# Patient Record
Sex: Female | Born: 1960
Health system: Southern US, Community
[De-identification: ages and names within clinical notes are randomized; demographics above are authoritative.]

## PROBLEM LIST (undated history)

## (undated) DIAGNOSIS — R51 Headache: Secondary | ICD-10-CM

## (undated) DIAGNOSIS — R0683 Snoring: Secondary | ICD-10-CM

## (undated) DIAGNOSIS — T4145XA Adverse effect of unspecified anesthetic, initial encounter: Secondary | ICD-10-CM

## (undated) DIAGNOSIS — T8859XA Other complications of anesthesia, initial encounter: Secondary | ICD-10-CM

## (undated) DIAGNOSIS — F988 Other specified behavioral and emotional disorders with onset usually occurring in childhood and adolescence: Secondary | ICD-10-CM

## (undated) DIAGNOSIS — M1712 Unilateral primary osteoarthritis, left knee: Secondary | ICD-10-CM

## (undated) DIAGNOSIS — M1990 Unspecified osteoarthritis, unspecified site: Secondary | ICD-10-CM

## (undated) DIAGNOSIS — E079 Disorder of thyroid, unspecified: Secondary | ICD-10-CM

## (undated) DIAGNOSIS — E039 Hypothyroidism, unspecified: Secondary | ICD-10-CM

## (undated) DIAGNOSIS — R5383 Other fatigue: Secondary | ICD-10-CM

## (undated) DIAGNOSIS — G500 Trigeminal neuralgia: Secondary | ICD-10-CM

## (undated) DIAGNOSIS — Z780 Asymptomatic menopausal state: Secondary | ICD-10-CM

## (undated) DIAGNOSIS — R319 Hematuria, unspecified: Principal | ICD-10-CM

## (undated) DIAGNOSIS — G479 Sleep disorder, unspecified: Secondary | ICD-10-CM

## (undated) HISTORY — DX: Disorder of thyroid, unspecified: E07.9

## (undated) HISTORY — PX: NASAL SEPTUM SURGERY: SHX37

## (undated) HISTORY — DX: Other fatigue: R53.83

## (undated) HISTORY — DX: Unspecified osteoarthritis, unspecified site: M19.90

## (undated) HISTORY — DX: Hematuria, unspecified: R31.9

## (undated) HISTORY — PX: OTHER SURGICAL HISTORY: SHX169

## (undated) HISTORY — DX: Snoring: R06.83

## (undated) HISTORY — PX: ABDOMINAL HYSTERECTOMY: SHX81

## (undated) HISTORY — DX: Other specified behavioral and emotional disorders with onset usually occurring in childhood and adolescence: F98.8

## (undated) HISTORY — DX: Sleep disorder, unspecified: G47.9

## (undated) HISTORY — PX: LASIK: SHX215

## (undated) HISTORY — PX: KNEE ARTHROSCOPY W/ DEBRIDEMENT: SHX1867

## (undated) HISTORY — DX: Asymptomatic menopausal state: Z78.0

## (undated) HISTORY — PX: VEIN SURGERY: SHX48

## (undated) HISTORY — PX: TOTAL ABDOMINAL HYSTERECTOMY: SHX209

---

## 2001-02-14 ENCOUNTER — Other Ambulatory Visit: Admission: RE | Admit: 2001-02-14 | Discharge: 2001-02-14 | Payer: Self-pay | Admitting: *Deleted

## 2001-10-17 ENCOUNTER — Ambulatory Visit (HOSPITAL_COMMUNITY): Admission: RE | Admit: 2001-10-17 | Discharge: 2001-10-17 | Payer: Self-pay | Admitting: Family Medicine

## 2001-10-17 ENCOUNTER — Encounter: Payer: Self-pay | Admitting: Family Medicine

## 2001-10-20 ENCOUNTER — Ambulatory Visit (HOSPITAL_COMMUNITY): Admission: RE | Admit: 2001-10-20 | Discharge: 2001-10-20 | Payer: Self-pay | Admitting: Family Medicine

## 2001-10-20 ENCOUNTER — Encounter: Payer: Self-pay | Admitting: Family Medicine

## 2002-04-13 ENCOUNTER — Encounter: Payer: Self-pay | Admitting: Family Medicine

## 2002-04-13 ENCOUNTER — Ambulatory Visit (HOSPITAL_COMMUNITY): Admission: RE | Admit: 2002-04-13 | Discharge: 2002-04-13 | Payer: Self-pay | Admitting: Family Medicine

## 2003-12-26 ENCOUNTER — Ambulatory Visit (HOSPITAL_COMMUNITY): Admission: RE | Admit: 2003-12-26 | Discharge: 2003-12-26 | Payer: Self-pay | Admitting: Internal Medicine

## 2004-03-21 ENCOUNTER — Inpatient Hospital Stay (HOSPITAL_COMMUNITY): Admission: AD | Admit: 2004-03-21 | Discharge: 2004-03-24 | Payer: Self-pay | Admitting: Internal Medicine

## 2004-05-15 ENCOUNTER — Encounter (HOSPITAL_COMMUNITY): Admission: RE | Admit: 2004-05-15 | Discharge: 2004-05-15 | Payer: Self-pay | Admitting: Endocrinology

## 2004-10-19 ENCOUNTER — Ambulatory Visit (HOSPITAL_COMMUNITY): Admission: RE | Admit: 2004-10-19 | Discharge: 2004-10-19 | Payer: Self-pay | Admitting: Internal Medicine

## 2006-06-24 ENCOUNTER — Encounter: Admission: RE | Admit: 2006-06-24 | Discharge: 2006-06-24 | Payer: Self-pay | Admitting: Obstetrics and Gynecology

## 2007-07-21 ENCOUNTER — Encounter: Admission: RE | Admit: 2007-07-21 | Discharge: 2007-07-21 | Payer: Self-pay | Admitting: Obstetrics and Gynecology

## 2007-07-27 ENCOUNTER — Encounter: Admission: RE | Admit: 2007-07-27 | Discharge: 2007-07-27 | Payer: Self-pay | Admitting: Obstetrics and Gynecology

## 2007-08-25 ENCOUNTER — Encounter: Admission: RE | Admit: 2007-08-25 | Discharge: 2007-08-25 | Payer: Self-pay | Admitting: Obstetrics and Gynecology

## 2007-12-01 ENCOUNTER — Ambulatory Visit (HOSPITAL_COMMUNITY): Admission: RE | Admit: 2007-12-01 | Discharge: 2007-12-01 | Payer: Self-pay | Admitting: Family Medicine

## 2007-12-27 ENCOUNTER — Ambulatory Visit (HOSPITAL_COMMUNITY): Admission: RE | Admit: 2007-12-27 | Discharge: 2007-12-27 | Payer: Self-pay | Admitting: Family Medicine

## 2008-07-26 ENCOUNTER — Encounter: Admission: RE | Admit: 2008-07-26 | Discharge: 2008-07-26 | Payer: Self-pay | Admitting: Obstetrics and Gynecology

## 2009-08-04 ENCOUNTER — Encounter: Admission: RE | Admit: 2009-08-04 | Discharge: 2009-08-04 | Payer: Self-pay | Admitting: Obstetrics and Gynecology

## 2009-10-10 ENCOUNTER — Encounter: Admission: RE | Admit: 2009-10-10 | Discharge: 2009-10-10 | Payer: Self-pay | Admitting: Otolaryngology

## 2010-08-05 ENCOUNTER — Encounter
Admission: RE | Admit: 2010-08-05 | Discharge: 2010-08-05 | Payer: Self-pay | Source: Home / Self Care | Attending: Obstetrics and Gynecology | Admitting: Obstetrics and Gynecology

## 2010-10-28 ENCOUNTER — Encounter (HOSPITAL_COMMUNITY)
Admission: RE | Admit: 2010-10-28 | Discharge: 2010-10-28 | Disposition: A | Discharge status: Home / Self Care | Payer: BC Managed Care – PPO | Source: Ambulatory Visit | Attending: Obstetrics and Gynecology | Admitting: Obstetrics and Gynecology

## 2010-10-28 LAB — CBC
HCT: 43.5 % (ref 36.0–46.0)
MCH: 30.5 pg (ref 26.0–34.0)
MCV: 96.2 fL (ref 78.0–100.0)
Platelets: 201 10*3/uL (ref 150–400)
RBC: 4.52 MIL/uL (ref 3.87–5.11)
RDW: 12.9 % (ref 11.5–15.5)
WBC: 4.5 10*3/uL (ref 4.0–10.5)

## 2010-10-30 ENCOUNTER — Other Ambulatory Visit: Payer: Self-pay | Admitting: Obstetrics and Gynecology

## 2010-10-30 ENCOUNTER — Ambulatory Visit (HOSPITAL_COMMUNITY)
Admission: RE | Admit: 2010-10-30 | Discharge: 2010-10-30 | Disposition: A | Discharge status: Home / Self Care | Payer: BC Managed Care – PPO | Source: Ambulatory Visit | Attending: Obstetrics and Gynecology | Admitting: Obstetrics and Gynecology

## 2010-10-30 DIAGNOSIS — Z01812 Encounter for preprocedural laboratory examination: Secondary | ICD-10-CM | POA: Insufficient documentation

## 2010-10-30 DIAGNOSIS — N949 Unspecified condition associated with female genital organs and menstrual cycle: Secondary | ICD-10-CM | POA: Insufficient documentation

## 2010-10-30 DIAGNOSIS — N938 Other specified abnormal uterine and vaginal bleeding: Secondary | ICD-10-CM | POA: Insufficient documentation

## 2010-10-30 DIAGNOSIS — N840 Polyp of corpus uteri: Secondary | ICD-10-CM | POA: Insufficient documentation

## 2010-10-30 DIAGNOSIS — Z01818 Encounter for other preprocedural examination: Secondary | ICD-10-CM | POA: Insufficient documentation

## 2010-11-11 NOTE — H&P (Signed)
  NAMEHONESTEE, Yvette Joyce                ACCOUNT NO.:  [id number]  MEDICAL RECORD NO.:  [id number]         PATIENT TYPE:  WAMB  LOCATION:                                [facility]:  [facility]  PHYSICIAN:  Sherron Monday, MD        DATE OF BIRTH:  [birth year]  DATE OF ADMISSION:  10/30/2010 DATE OF DISCHARGE:                             HISTORY & PHYSICAL   ADMISSION DIAGNOSES:  Irregular vaginal bleeding with likely endometrial mass i.e. polyp.  PROCEDURE PLANNED:  Hysteroscopy, dilatation and curettage, and polypectomy.  HISTORY OF PRESENT ILLNESS:  A 50 year old G0 with irregular vaginal bleeding who presents for evaluation.  After a ultrasound, it was noted that she had a normal-sized uterus with a complex endometrial mass, but poorly outlined with a calcified area with vascular flow, the mass was approximately 5 x 2 cm, otherwise evaluate the cavity.  The patient also discussed the results of the ultrasound and it was decided to proceed with a hysteroscopy, D and C, and polypectomy.  PAST MEDICAL HISTORY:  Significant for thyroid disease.  PAST SURGICAL HISTORY:  Significant for foot surgery and sinus surgery.  PAST OB/GYN HISTORY:  She is a G0.  No history of any abnormal Pap smears with an HPV negative pap in January 2012.  No history of any sexually transmitted diseases.  She is not sexually active with occasional spotting.  MEDICATIONS:  Levothyroxine 50 mg daily.  ALLERGIES:  No known drug allergies.  SOCIAL HISTORY:  Denies alcohol or drug use.  States she occasionally has alcohol.  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  FAMILY HISTORY:  Significant for breast cancer in a cousin, hypertension in mother and father, colon cancer in uncle, heart disease in the father, stroke in mother, and osteoporosis on her mother's side.  PHYSICAL EXAMINATION:  VITAL SIGNS:  She is 5 feet, 6 inches tall, weight is 128 pounds. GENERAL:  No apparent distress. CARDIOVASCULAR:   Regular rate and rhythm. LUNGS:  Clear to auscultation bilaterally. ABDOMEN:  Soft, nontender, nondistended.  Good bowel sounds. EXTREMITIES:  Symmetric and nontender. NECK:  No lymphadenopathy.  Thyroid within normal limits. HEENT:  Mucous membranes are moist. BREASTS:  B+.  No masses, nontender with no distortion. BACK:  Without costovertebral angle tenderness. PELVIC:  Normal external female genitalia.  Normal Bartholin's, urethral, and Skene's glands.  Cervix and vagina without lesions. Cervical motion tenderness.  Normal-sized uterus, normal adnexa.  An ultrasound revealed a normal-sized uterus with  mass in the uterus.  ASSESSMENT/PLAN:  A 50 year old G0 with likely endometrial polyp for hysteroscopy, dilatation and curettage, and polypectomy for removal of this polyp was sent to pathology for further evaluation.     Sherron Monday, MD     JB/MEDQ  D:  10/29/2010  T:  10/29/2010  Job:  [id number]  Electronically Signed by Sherron Monday MD on 11/11/2010 04:25:26 PM

## 2010-11-11 NOTE — Op Note (Signed)
  NAMEYARESLY, MENZEL                ACCOUNT NO.:  [id number]  MEDICAL RECORD NO.:  [id number]           PATIENT TYPE:  O  LOCATION:  [facility]                          [facility]  PHYSICIAN:  Sherron Monday, MD        DATE OF BIRTH:  06-Jan-1961  DATE OF PROCEDURE:  10/30/2010 DATE OF DISCHARGE:                              OPERATIVE REPORT   PREOPERATIVE DIAGNOSIS:  Irregular vaginal bleeding likely polyp by ultrasound.  POSTOPERATIVE DIAGNOSES:  Irregular vaginal bleeding likely polyp by ultrasound, multiple polyps.  PROCEDURES:  Operative hysteroscopy, dilation and curettage and polypectomy.  SURGEON:  Sherron Monday, MD  ASSISTANT:  None.  ANESTHESIA:  General by LMA with 15 mL of 2% lidocaine for a paracervical block.  FINDINGS:  Small anteverted uterus with multiple polyps.  SPECIMENS:  Polyps and endometrial curettings to Pathology.  ESTIMATED BLOOD LOSS:  Minimal.  IV FLUIDS:  700 mL.  URINE OUTPUT:  I and O cath at the end of the procedure.  COMPLICATIONS:  None.  DISPOSITION:  Stable to PACU following the procedure.  PROCEDURE:  After informed consent was reviewed with the patient, she was transferred to the OR where she was placed on the table.  General anesthesia was induced and found to be adequate.  She was then placed in the Y[place]in stirrups, prepped and draped in the normal sterile fashion. Her bladder was sterilely emptied.  A exam was performed revealing an anteverted uterus and open-sided speculum was placed in her vagina.  A paracervical block was obtained.  A single tooth tenaculum was applied to the anterior lip of the cervix.  Cervix was dilated to accommodate the hysteroscope to 23-French.  A brief pelvic survey was performed revealed multiple endometrial polyps using a combination of D and C and the Randall stone forceps.  Two small polyps were removed intermittently checking to make sure that they had been removed.  Both ostia were also  identified.  The instruments were removed from the patient's vagina.  Hemostasis was assured.  The patient was awakened in stable condition and transferred to the PACU following the procedure.     Sherron Monday, MD     JB/MEDQ  D:  10/30/2010  T:  10/31/2010  Job:  [id number]  Electronically Signed by Sherron Monday MD on 11/11/2010 04:25:39 PM

## 2011-01-01 NOTE — H&P (Signed)
NAME:  Yvette Joyce, Yvette Joyce                          ACCOUNT NO.:  [id number]   MEDICAL RECORD NO.:  [id number]                   PATIENT TYPE:  INP   LOCATION:  A203                                 FACILITY:  [facility]   PHYSICIAN:  Madelin Rear. Sherwood Gambler, M.D.             DATE OF BIRTH:  03/30/1961   DATE OF ADMISSION:  03/21/2004  DATE OF DISCHARGE:                                HISTORY & PHYSICAL   CHIEF COMPLAINT:  Chest pain.   HISTORY OF PRESENT ILLNESS:  The patient presents with approximately 12  hours of nocturnally awakening chest pain in the left anterior chest wall  area.  She had no associated symptoms, just persistence of a waxing and  waning dull ache.  There was no diaphoretic spell, shortness of breath,  palpitations, syncope, nausea, vomiting.  No shortness of breath, cough or  sputum.  She does admit to a little bit of hoarseness associated with this  and possibly a little bit of a scratchy throat.  However, denies any fever,  rigors, chills or skin rash.  There is no tick bite exposure known.  She  admitted to mild posterior myalgias of the neck and trapezius area.   PAST MEDICAL HISTORY:  1. Hypothyroidism, documented as euthyroid on March 16, 2004 recent blood     work.  2. She has also had some hypercholesterolemia.  However, last set of lipids     was drawn when she was hypothyroid.   MEDICATIONS:  She is maintained on chronic medication with birth control  pills as well as Synthroid.   She has had previous recurrent back pain.   Cardiac risk factors include an extremely strong family history with  multiple first degree relatives with coronary artery disease.  She does  personally continue good exercise regimen and has never had any exertional  chest pain.  In the remote past as a child, she experienced palpitations,  was evaluated by cardiology and felt to be with no acute illness and  required no therapy for that.   FAMILY HISTORY:  As outlined under HPI, past  medical history and cardiac  risk factors.   SOCIAL HISTORY:  She does not smoke, drink or use any illicit drugs.   REVIEW OF SYSTEMS:  Under HPI.  Otherwise negative.   PHYSICAL EXAMINATION:  SKIN:  Unremarkable.  HEAD/NECK:  No JVD or adenopathy.  NECK:  Supple  CHEST:  Clear.  CARDIAC:  Regular rate and rhythm without murmurs, gallops or rubs.  Specifically there is no chest wall tenderness.  ABDOMEN:  Benign.  NEUROLOGIC:  Normal.  EXTREMITIES:  Without clubbing, cyanosis or edema.   EKG performed in the office reveals normal sinus rhythm and is within normal  limits.  She is having chest pain at the time of the EKG.   IMPRESSION:  Chest pain, unexplained at this point.  Likely noncardiac.  However, with the notorious atypical presentation of  middle aged females  with cardiac disease, she will be admitted for a rule out protocol.  Cardiology consultation is anticipated.  Thyroid status is euthyroid.  We  will check her lipid profile now that she is euthyroid.  She will, of  course, require a stress Cardiolite at some point.  We will leave this for  cardiology to perform, probably as an outpatient.     ___________________________________________                                         Madelin Rear. Sherwood Gambler, M.D.   LJF/MEDQ  D:  03/21/2004  T:  03/21/2004  Job:  [id number]

## 2011-01-01 NOTE — Procedures (Signed)
NAMEMAZEL, VILLELA                          ACCOUNT NO.:  [id number]   MEDICAL RECORD NO.:  [id number]                   PATIENT TYPE:  INP   LOCATION:  A203                                 FACILITY:  [facility]   PHYSICIAN:  Dani Gobble, MD                    DATE OF BIRTH:  11/06/1960   DATE OF PROCEDURE:  03/23/2004  DATE OF DISCHARGE:                                  ECHOCARDIOGRAM   INDICATIONS FOR PROCEDURE:  Ms. Jeanella Anton is a 50 year old female with a past  medical history of hypothyroidism, who was admitted with chest pain.   DESCRIPTION OF PROCEDURE:  The technical quality of the study is adequate.   The aorta is within normal limits at 2.2 cm.   The left atrium is also within normal limits at 3.3 cm.  No obvious clots or  masses were appreciated and the patient appeared to be in sinus rhythm  during this procedure.   The intraventricular septal and posterior wall are within normal limits in  thickness at 1.0 cm and 0.9 cm, respectively.   The aortic valve is thin, pliable, and trileaflet with normal leaflet  excursion.  No aortic insufficiency is noted.  Doppler interrogation of the  aortic valve is within normal limits.   The mitral valve is also grossly structurally normal.  No mitral valve  prolapse is noted, although there is flat coaptation of the mitral valve  leaflets.  Trivial to mild mitral valve regurgitation is noted.  Doppler  interrogation of the mitral valve is within normal limits.   The pulmonic valve was incompletely visualized but appeared to be grossly  structurally normal.  Trivial pulmonic insufficiency is noted.   The tricuspid valve also appears structurally normal with trivial tricuspid  regurgitation noted.   The left ventricle is normal in size with the LVIDD measuring at 4.2 cm and  the LVID measuring at 3.1 cm.  Overall left ventricular systolic function is  normal and no regional wall motion abnormalities are noted.  There is no  evidence  for diastolic dysfunction on this study.  The right atrium and  right ventricle are normal in size and the right ventricular systolic  function is normal.   IMPRESSION:  1. Trace to mild mitral regurgitation.  2. Trivial tricuspid and pulmonic insufficiency.  3. Normal left ventricular size and systolic function without regional wall     motion abnormalities.  4. No obvious pericardial effusion is appreciated.  5. Essentially a normal echocardiogram.      ___________________________________________                                            Dani Gobble, MD   AB/MEDQ  D:  03/23/2004  T:  03/23/2004  Job:  [id number]  cc:   Madelin Rear. Sherwood Gambler, M.D.  [address]  [place]  [place] [address]  Fax: [phone]

## 2011-01-01 NOTE — Discharge Summary (Signed)
NAME:  Yvette Joyce, Yvette Joyce                          ACCOUNT NO.:  [id number]   MEDICAL RECORD NO.:  [id number]                   PATIENT TYPE:  INP   LOCATION:  A203                                 FACILITY:  [facility]   PHYSICIAN:  Madelin Rear. Sherwood Gambler, M.D.             DATE OF BIRTH:  08-May-1961   DATE OF ADMISSION:  03/21/2004  DATE OF DISCHARGE:  03/24/2004                                 DISCHARGE SUMMARY   DISCHARGE DIAGNOSES:  1. Chest pain, unclear etiology, possible esophageal spasm.  2. Hypothyroidism.  3. Hyperlipidemia.   DISCHARGE MEDICATIONS:  1. Protonix 40 mg p.o. b.i.d.  2. Synthroid 50 micrograms p.o. daily.   SUMMARY:  The patient was admitted with atypical left-sided chest pain.  It  is not exertionally induced.  She had a severely increased risk secondary to  cardiac risk factors, i.e. strong family history of multiple young people  with coronary disease as well as known hyperlipidemia.  Her rule out enzymes  serially were negative.  She was seen in consultation by cardiology with  outpatient arrangements made for a stress test.  An echocardiogram was  unrevealing, and a CT of the chest was unrevealing as well.     ___________________________________________                                         Madelin Rear. Sherwood Gambler, M.D.   LJF/MEDQ  D:  04/06/2004  T:  04/06/2004  Job:  [id number]

## 2011-07-20 ENCOUNTER — Other Ambulatory Visit: Payer: Self-pay | Admitting: Obstetrics and Gynecology

## 2011-07-20 DIAGNOSIS — Z1231 Encounter for screening mammogram for malignant neoplasm of breast: Secondary | ICD-10-CM

## 2011-08-16 ENCOUNTER — Ambulatory Visit
Admission: RE | Admit: 2011-08-16 | Discharge: 2011-08-16 | Disposition: A | Discharge status: Home / Self Care | Payer: BC Managed Care – PPO | Source: Ambulatory Visit | Attending: Obstetrics and Gynecology | Admitting: Obstetrics and Gynecology

## 2011-08-16 DIAGNOSIS — Z1231 Encounter for screening mammogram for malignant neoplasm of breast: Secondary | ICD-10-CM

## 2011-09-07 ENCOUNTER — Telehealth: Payer: Self-pay

## 2011-09-07 ENCOUNTER — Other Ambulatory Visit: Payer: Self-pay

## 2011-09-07 DIAGNOSIS — Z139 Encounter for screening, unspecified: Secondary | ICD-10-CM

## 2011-09-07 NOTE — Telephone Encounter (Signed)
Gastroenterology Pre-Procedure Form   Request Date: 09/07/2011       Requesting Physician: Dr. Phillips Odor     PATIENT INFORMATION:  Yvette Joyce is a 51 y.o., female (DOB=11-12-1960).  PROCEDURE: Procedure(s) requested: colonoscopy Procedure Reason: screening for colon cancer  PATIENT REVIEW QUESTIONS: The patient reports the following:   1. Diabetes Melitis: no 2. Joint replacements in the past 12 months: no 3. Major health problems in the past 3 months: no 4. Has an artificial valve or MVP:no  / pt said that she out grew 5. Has been advised in past to take antibiotics in advance of a procedure like teeth cleaning: no}    MEDICATIONS & ALLERGIES:    Patient reports the following regarding taking any blood thinners:   Plavix? no Aspirin?no Coumadin?  no  Patient confirms/reports the following medications:  Current Outpatient Prescriptions  Medication Sig Dispense Refill  . levothyroxine (SYNTHROID, LEVOTHROID) 50 MCG tablet Take 50 mcg by mouth daily.      . Multiple Vitamin (MULTIVITAMIN) tablet Take 1 tablet by mouth daily.      . NON FORMULARY Vitamin B12 sublingual      . NON FORMULARY Vitamin D     Once daily as directed        Patient confirms/reports the following allergies:  No Known Allergies  Patient is appropriate to schedule for requested procedure(s): yes  AUTHORIZATION INFORMATION Primary Insurance:   ID #:   Group #:  Pre-Cert / Auth required:  Pre-Cert / Auth #:   Secondary Insurance:   ID #:  Group #:  Pre-Cert / Auth required: Pre-Cert / Auth #:   No orders of the defined types were placed in this encounter.    SCHEDULE INFORMATION: Procedure has been scheduled as follows:  Date:09/24/2011        Time: 9:30 AM  Location: [facility] Short Stay  This Gastroenterology Pre-Precedure Form is being routed to the following provider(s) for review: Jonette Eva, MD

## 2011-09-08 NOTE — Telephone Encounter (Signed)
MOVI PREP SPLIT DOSING, REGULAR BREAKFAST. CLEAR LIQUIDS AFTER 9 AM.  

## 2011-09-08 NOTE — Telephone Encounter (Signed)
Rx and instructions mailed to pt.  

## 2011-09-10 ENCOUNTER — Encounter (HOSPITAL_COMMUNITY): Payer: Self-pay | Admitting: Pharmacy Technician

## 2011-09-23 MED ORDER — SODIUM CHLORIDE 0.45 % IV SOLN
Freq: Once | INTRAVENOUS | Status: AC
  Administered 2011-09-24: 09:00:00 via INTRAVENOUS

## 2011-09-24 ENCOUNTER — Encounter (HOSPITAL_COMMUNITY): Payer: Self-pay | Admitting: *Deleted

## 2011-09-24 ENCOUNTER — Encounter (HOSPITAL_COMMUNITY)
Admission: RE | Disposition: A | Discharge status: Home / Self Care | Payer: Self-pay | Source: Ambulatory Visit | Attending: Gastroenterology

## 2011-09-24 ENCOUNTER — Other Ambulatory Visit: Payer: Self-pay | Admitting: Gastroenterology

## 2011-09-24 ENCOUNTER — Ambulatory Visit (HOSPITAL_COMMUNITY)
Admission: RE | Admit: 2011-09-24 | Discharge: 2011-09-24 | Disposition: A | Discharge status: Home / Self Care | Payer: Managed Care, Other (non HMO) | Source: Ambulatory Visit | Attending: Gastroenterology | Admitting: Gastroenterology

## 2011-09-24 DIAGNOSIS — K5730 Diverticulosis of large intestine without perforation or abscess without bleeding: Secondary | ICD-10-CM

## 2011-09-24 DIAGNOSIS — D126 Benign neoplasm of colon, unspecified: Secondary | ICD-10-CM | POA: Insufficient documentation

## 2011-09-24 DIAGNOSIS — K648 Other hemorrhoids: Secondary | ICD-10-CM

## 2011-09-24 DIAGNOSIS — Z139 Encounter for screening, unspecified: Secondary | ICD-10-CM

## 2011-09-24 DIAGNOSIS — Z1211 Encounter for screening for malignant neoplasm of colon: Secondary | ICD-10-CM | POA: Insufficient documentation

## 2011-09-24 HISTORY — DX: Hypothyroidism, unspecified: E03.9

## 2011-09-24 HISTORY — DX: Headache: R51

## 2011-09-24 HISTORY — PX: COLONOSCOPY: SHX5424

## 2011-09-24 SURGERY — COLONOSCOPY
Anesthesia: Moderate Sedation

## 2011-09-24 MED ORDER — STERILE WATER FOR IRRIGATION IR SOLN
Status: DC | PRN
  Administered 2011-09-24: 09:00:00

## 2011-09-24 MED ORDER — PROMETHAZINE HCL 25 MG/ML IJ SOLN
INTRAMUSCULAR | Status: AC
  Filled 2011-09-24: qty 1

## 2011-09-24 MED ORDER — MEPERIDINE HCL 100 MG/ML IJ SOLN
INTRAMUSCULAR | Status: DC | PRN
  Administered 2011-09-24: 25 mg via INTRAVENOUS
  Administered 2011-09-24: 50 mg via INTRAVENOUS

## 2011-09-24 MED ORDER — MIDAZOLAM HCL 5 MG/5ML IJ SOLN
INTRAMUSCULAR | Status: AC
  Filled 2011-09-24: qty 10

## 2011-09-24 MED ORDER — MIDAZOLAM HCL 5 MG/5ML IJ SOLN
INTRAMUSCULAR | Status: DC | PRN
  Administered 2011-09-24: 1 mg via INTRAVENOUS
  Administered 2011-09-24: 2 mg via INTRAVENOUS
  Administered 2011-09-24: 1 mg via INTRAVENOUS

## 2011-09-24 MED ORDER — SODIUM CHLORIDE 0.9 % IJ SOLN
INTRAMUSCULAR | Status: AC
  Filled 2011-09-24: qty 10

## 2011-09-24 MED ORDER — MEPERIDINE HCL 100 MG/ML IJ SOLN
INTRAMUSCULAR | Status: AC
  Filled 2011-09-24: qty 2

## 2011-09-24 NOTE — H&P (Signed)
Primary Care Physician:  Cassell Smiles., MD, MD Primary Gastroenterologist:  Dr. Darrick Penna  Pre-Procedure History & Physical: HPI:  Yvette Joyce is a 51 y.o. female here for COLON CANCER SCREENING.   Past Medical History  Diagnosis Date  . Hypothyroidism   . Headache   . Constipation     Past Surgical History  Procedure Date  . Fibroids removed from uterus   . Cosmetic eye surgery     Prior to Admission medications   Medication Sig Start Date End Date Taking? Authorizing Provider  Cholecalciferol (VITAMIN D PO) Take 1 tablet by mouth daily.   Yes Historical Provider, MD  Cyanocobalamin (VITAMIN B-12 SL) Place 1 tablet under the tongue daily.   Yes Historical Provider, MD  levothyroxine (SYNTHROID, LEVOTHROID) 50 MCG tablet Take 50 mcg by mouth daily.   Yes Historical Provider, MD  Multiple Vitamin (MULITIVITAMIN WITH MINERALS) TABS Take 1 tablet by mouth daily.   Yes Historical Provider, MD  acetaminophen (TYLENOL) 500 MG tablet Take 500-1,000 mg by mouth every 6 (six) hours as needed. For pain    Historical Provider, MD  ibuprofen (ADVIL,MOTRIN) 200 MG tablet Take 400 mg by mouth every 6 (six) hours as needed. For pain    Historical Provider, MD    Allergies as of 09/07/2011  . (No Known Allergies)    Family History  Problem Relation Age of Onset  . Colon cancer Maternal Uncle   NO  POLYPS   History   Social History  . Marital Status: Legally Separated    Spouse Name: N/A    Number of Children: N/A  . Years of Education: N/A   Occupational History  . Not on file.   Social History Main Topics  . Smoking status: Never Smoker   . Smokeless tobacco: Not on file  . Alcohol Use: 0.6 oz/week    1 Shots of liquor per week  . Drug Use: No  . Sexually Active:    Other Topics Concern  . Not on file   Social History Narrative  . No narrative on file    Review of Systems: See HPI, otherwise negative ROS   Physical Exam: BP 138/81  Pulse 71  Temp(Src)  98.3 F (36.8 C) (Oral)  Resp 14  Ht 5\' 6"  (1.676 m)  Wt 148 lb (67.132 kg)  BMI 23.89 kg/m2  SpO2 99% General:   Alert,  pleasant and cooperative in NAD Head:  Normocephalic and atraumatic. Neck:  Supple; Lungs:  Clear throughout to auscultation.    Heart:  Regular rate and rhythm. Abdomen:  Soft, nontender and nondistended. Normal bowel sounds, without guarding, and without rebound.   Neurologic:  Alert and  oriented x4;  grossly normal neurologically.  Impression/Plan:     AVERAGE RISK  PLAN: TCS TODAY

## 2011-09-24 NOTE — Op Note (Signed)
[facility] [address] [place], [place]  [address]  COLONOSCOPY PROCEDURE REPORT  PATIENT:  Yvette Joyce, Yvette Joyce  MR#:  [id number] BIRTHDATE:  11-21-1960, 51 yrs. old  GENDER:  female  ENDOSCOPIST:  Jonette Eva, MD REF. BY:  Artis Delay, M.D. ASSISTANT:  PROCEDURE DATE:  09/24/2011 PROCEDURE:  Colonoscopy with biopsy  INDICATIONS:  screening  MEDICATIONS:   Demerol 75 mg IV, Versed 4 mg IV  DESCRIPTION OF PROCEDURE:    Physical exam was performed. Informed consent was obtained from the patient after explaining the benefits, risks, and alternatives to procedure.  The patient was connected to monitor and placed in left lateral position. Continuous oxygen was provided by nasal cannula and IV medicine administered through an indwelling cannula.  After administration of sedation and rectal exam, the patient's rectum was intubated and the EC-[id number] ([id number]) colonoscope was advanced under direct visualization to the cecum.  The scope was removed slowly by carefully examining the color, texture, anatomy, and integrity mucosa on the way out.  The patient was recovered in endoscopy and discharged home in satisfactory condition. <<PROCEDUREIMAGES>>  FINDINGS:  A sessile polyp was found in the ascending colon. Diverticula were found in the sigmoid colon.  Internal Hemorrhoids were found.  PREP QUALITY: EXCELLENT CECAL W/D TIME:     12 minutes  COMPLICATIONS:    None  ENDOSCOPIC IMPRESSION: 1) Sessile polyp in the ascending colon 2) Diverticula in the sigmoid colon 3) Internal hemorrhoids  RECOMMENDATIONS: TCS IN 10 YEARS AWAIT BIOPSIES HIGH FIBER DIET  REPEAT EXAM:  No  ______________________________ Jonette Eva, MD  CC:  Artis Delay, M.D.  n. eSIGNED:   Jlon Betker at 09/24/2011 11:37 AM  [place] Harvey, [phone]

## 2011-09-30 ENCOUNTER — Telehealth: Payer: Self-pay | Admitting: Gastroenterology

## 2011-09-30 NOTE — Telephone Encounter (Signed)
Please call pt. She had A simple adenoma removed from her colon. TCS in 10 years. High fiber diet.  

## 2011-09-30 NOTE — Telephone Encounter (Signed)
Pt informed

## 2011-10-01 ENCOUNTER — Encounter (HOSPITAL_COMMUNITY): Payer: Self-pay | Admitting: Gastroenterology

## 2011-10-04 NOTE — Telephone Encounter (Signed)
Faxed to PCP

## 2011-12-01 NOTE — Patient Instructions (Addendum)
20 Yvette Joyce  12/01/2011   Your procedure is scheduled on: Friday, April 19th  Report to [facility] at [id number].  Call this number if you have problems the morning of surgery: [phone]   Remember:   Do not eat food:After Midnight.  May have clear liquids:until Midnight .  Clear liquids include soda, tea, black coffee, apple or grape juice, broth.  Take these medicines the morning of surgery with A SIP OF WATER: synthroid   Do not wear jewelry, make-up or nail polish.  Do not wear lotions, powders, or perfumes. You may wear deodorant.  Do not shave 48 hours prior to surgery.  Do not bring valuables to the hospital.  Contacts, dentures or bridgework may not be worn into surgery.  Leave suitcase in the car. After surgery it may be brought to your room.  For patients admitted to the hospital, checkout time is 11:00 AM the day of discharge.   Patients discharged the day of surgery will not be allowed to drive home.  Name and phone number of your driver :family  Special Instructions: CHG Shower Use Special Wash: 1/2 bottle night before surgery and 1/2 bottle morning of surgery.   Please read over the following fact sheets that you were given: Pain Booklet, MRSA Information, Surgical Site Infection Prevention, Anesthesia Post-op Instructions and Care and Recovery After Surgery   Dilation and Curettage or Vacuum Curettage Dilation and curettage (D&C) and vacuum curettage are minor procedures. A D&C involves stretching (dilation) the cervix and scraping (curettage) the inside lining of the womb (uterus). During a D&C, tissue is gently scraped from the inside lining of the uterus. During a vacuum curettage, the lining and tissue in the uterus are removed with the use of gentle suction. Curettage may be performed for diagnostic or therapeutic purposes. As a diagnostic procedure, curettage is performed for the purpose of examining tissues from the uterus. Tissue examination may help determine  causes or treatment options for symptoms. A diagnostic curettage may be performed for the following symptoms:  Irregular bleeding in the uterus.   Bleeding with the development of clots.   Spotting between menstrual periods.   Prolonged menstrual periods.   Bleeding after menopause.   No menstrual period (amenorrhea).   A change in size and shape of the uterus.  A therapeutic curettage is performed to remove tissue, blood, or a contraceptive device. Therapeutic curettage may be performed for the following conditions:   Removal of an IUD (intrauterine device).   Removal of retained placenta after giving birth. Retained placenta can cause bleeding severe enough to require transfusions or an infection.   Abortion.   Miscarriage.   Removal of polyps inside the uterus.   Removal of uncommon types of fibroids (noncancerous lumps).  LET YOUR CAREGIVER KNOW ABOUT:   Allergies to food or medicine.   Medicines taken, including vitamins, herbs, eyedrops, over-the-counter medicines, and creams.   Use of steroids (by mouth or creams).   Previous problems with anesthetics or numbing medicines.   History of bleeding problems or blood clots.   Previous surgery.   Other health problems, including diabetes and kidney problems.   Possibility of pregnancy, if this applies.  RISKS AND COMPLICATIONS   Excessive bleeding.   Infection of the uterus.   Damage to the cervix.   Development of scar tissue (adhesions) inside the uterus, later causing abnormal amounts of menstrual bleeding.   Complications from the general anesthetic, if a general anesthetic is used.   Putting  a hole (perforation) in the uterus. This is rare.  BEFORE THE PROCEDURE   Eat and drink before the procedure only as directed by your caregiver.   Arrange for someone to take you home.  PROCEDURE   This procedure may be done in a hospital, outpatient clinic, or caregiver's office.   You may be given a  general anesthetic or a local anesthetic in and around the cervix.   You will lie on your back with your legs in stirrups.   There are two ways in which your cervix can be softened and dilated. These include:   Taking a medicine.   Having thin rods (laminaria) inserted into your cervix.   A curved tool (curette) will scrape cells from the inside lining of the uterus and will then be removed.  This procedure usually takes about 15 to 30 minutes. AFTER THE PROCEDURE   You will rest in the recovery area until you are stable and are ready to go home.   You will need to have someone take you home.   You may feel sick to your stomach (nauseous) or throw up (vomit) if you had general anesthesia.   You may have a sore throat if a tube was placed in your throat during general anesthesia.   You may have light cramping and bleeding for 2 days to 2 weeks after the procedure.   Your uterus needs to make a new lining after the procedure. This may make your next period late.  Document Released: 08/02/2005 Document Revised: 07/22/2011 Document Reviewed: 02/28/2009 [facility] Patient Information 2012 [place], [place].Hysteroscopy Hysteroscopy is a procedure used for looking inside the womb (uterus). It may be done for many different reasons, including:  To evaluate abnormal bleeding, fibroid (benign, noncancerous) tumors, polyps, scar tissue (adhesions), and possibly cancer of the uterus.   To look for lumps (tumors) and other uterine growths.   To look for causes of why a woman cannot get pregnant (infertility), causes of recurrent loss of pregnancy (miscarriages), or a lost intrauterine device (IUD).   To perform a sterilization by blocking the fallopian tubes from inside the uterus.  A hysteroscopy should be done right after a menstrual period to be sure you are not pregnant. LET YOUR CAREGIVER KNOW ABOUT:   Allergies.   Medicines taken, including herbs, eyedrops, over-the-counter medicines,  and creams.   Use of steroids (by mouth or creams).   Previous problems with anesthetics or numbing medicines.   History of bleeding or blood problems.   History of blood clots.   Possibility of pregnancy, if this applies.   Previous surgery.   Other health problems.  RISKS AND COMPLICATIONS   Putting a hole in the uterus.   Excessive bleeding.   Infection.   Damage to the cervix.   Injury to other organs.   Allergic reaction to medicines.   Too much fluid used in the uterus for the procedure.  BEFORE THE PROCEDURE   Do not take aspirin or blood thinners for a week before the procedure, or as directed. It can cause bleeding.   Arrive at least 60 minutes before the procedure or as directed to read and sign the necessary forms.   Arrange for someone to take you home after the procedure.   If you smoke, do not smoke for 2 weeks before the procedure.  PROCEDURE   Your caregiver may give you medicine to relax you. He or she may also give you a medicine that numbs the area around  the cervix (local anesthetic) or a medicine that makes you sleep (general anesthesia).   Sometimes, a medicine is placed in the cervix the day before the procedure. This medicine makes the cervix have a larger opening (dilate). This makes it easier for the instrument to be inserted into the uterus.   A small instrument (hysteroscope) is inserted through the vagina into the uterus. This instrument is similar to a pencil-sized telescope with a light.   During the procedure, air or a liquid is put into the uterus, which allows the surgeon to see better.   Sometimes, tissue is gently scraped from inside the uterus. These tissue samples are sent to a specialist who looks at tissue samples (pathologist). The pathologist will give a report to your caregiver. This will help your caregiver decide if further treatment is necessary. The report will also help your caregiver decide on the best treatment if the  test comes back abnormal.  AFTER THE PROCEDURE   If you had a general anesthetic, you may be groggy for a couple hours after the procedure.   If you had a local anesthetic, you will be advised to rest at the surgical center or caregiver's office until you are stable and feel ready to go home.   You may have some cramping for a couple days.   You may have bleeding, which varies from light spotting for a few days to menstrual-like bleeding for up to 3 to 7 days. This is normal.   Have someone take you home.  FINDING OUT THE RESULTS OF YOUR TEST Not all test results are available during your visit. If your test results are not back during the visit, make an appointment with your caregiver to find out the results. Do not assume everything is normal if you have not heard from your caregiver or the medical facility. It is important for you to follow up on all of your test results. HOME CARE INSTRUCTIONS   Do not drive for 24 hours or as instructed.   Only take over-the-counter or prescription medicines for pain, discomfort, or fever as directed by your caregiver.   Do not take aspirin. It can cause or aggravate bleeding.   Do not drive or drink alcohol while taking pain medicine.   You may resume your usual diet.   Do not use tampons, douche, or have sexual intercourse for 2 weeks, or as advised by your caregiver.   Rest and sleep for the first 24 to 48 hours.   Take your temperature twice a day for 4 to 5 days. Write it down. Give these temperatures to your caregiver if they are abnormal (above 98.6 F or 37.0 C).   Take medicines your caregiver has ordered as directed.   Follow your caregiver's advice regarding diet, exercise, lifting, driving, and general activities.   Take showers instead of baths for 2 weeks, or as recommended by your caregiver.   If you develop constipation:   Take a mild laxative with the advice of your caregiver.   Eat bran foods.   Drink enough water  and fluids to keep your urine clear or pale yellow.   Try to have someone with you or available to you for the first 24 to 48 hours, especially if you had a general anesthetic.   Make sure you and your family understand everything about your operation and recovery.   Follow your caregiver's advice regarding follow-up appointments and Pap smears.  SEEK MEDICAL CARE IF:   You feel  dizzy or lightheaded.   You feel sick to your stomach (nauseous).   You develop abnormal vaginal discharge.   You develop a rash.   You have an abnormal reaction or allergy to your medicine.   You need stronger pain medicine.  SEEK IMMEDIATE MEDICAL CARE IF:   Bleeding is heavier than a normal menstrual period or you have blood clots.   You have an oral temperature above 102 F (38.9 C), not controlled by medicine.   You have increasing cramps or pains not relieved with medicine.   You develop belly (abdominal) pain that does not seem to be related to the same area of earlier cramping and pain.   You pass out.   You develop pain in the tops of your shoulders (shoulder strap areas).   You develop shortness of breath.  MAKE SURE YOU:   Understand these instructions.   Will watch your condition.   Will get help right away if you are not doing well or get worse.  Document Released: 11/08/2000 Document Revised: 07/22/2011 Document Reviewed: 03/03/2009 [facility] Patient Information 2012 [place], [place].  PATIENT INSTRUCTIONS POST-ANESTHESIA  IMMEDIATELY FOLLOWING SURGERY:  Do not drive or operate machinery for the first twenty four hours after surgery.  Do not make any important decisions for twenty four hours after surgery or while taking narcotic pain medications or sedatives.  If you develop intractable nausea and vomiting or a severe headache please notify your doctor immediately.  FOLLOW-UP:  Please make an appointment with your surgeon as instructed. You do not need to follow up with  anesthesia unless specifically instructed to do so.  WOUND CARE INSTRUCTIONS (if applicable):  Keep a dry clean dressing on the anesthesia/puncture wound site if there is drainage.  Once the wound has quit draining you may leave it open to air.  Generally you should leave the bandage intact for twenty four hours unless there is drainage.  If the epidural site drains for more than 36-48 hours please call the anesthesia department.  QUESTIONS?:  Please feel free to call your physician or the hospital operator if you have any questions, and they will be happy to assist you.     [facility] Anesthesia Department [address] [place] [phone]

## 2011-12-02 ENCOUNTER — Encounter (HOSPITAL_COMMUNITY): Payer: Self-pay

## 2011-12-02 ENCOUNTER — Other Ambulatory Visit: Payer: Self-pay | Admitting: Obstetrics and Gynecology

## 2011-12-02 ENCOUNTER — Encounter (HOSPITAL_COMMUNITY)
Admission: RE | Admit: 2011-12-02 | Discharge: 2011-12-02 | Disposition: A | Discharge status: Home / Self Care | Payer: Managed Care, Other (non HMO) | Source: Ambulatory Visit | Attending: Obstetrics and Gynecology | Admitting: Obstetrics and Gynecology

## 2011-12-02 ENCOUNTER — Other Ambulatory Visit: Payer: Self-pay

## 2011-12-02 HISTORY — DX: Adverse effect of unspecified anesthetic, initial encounter: T41.45XA

## 2011-12-02 HISTORY — DX: Other complications of anesthesia, initial encounter: T88.59XA

## 2011-12-02 LAB — BASIC METABOLIC PANEL
Calcium: 9.9 mg/dL (ref 8.4–10.5)
GFR calc non Af Amer: 84 mL/min — ABNORMAL LOW (ref >90)
Sodium: 141 mEq/L (ref 135–145)

## 2011-12-02 LAB — URINALYSIS, ROUTINE W REFLEX MICROSCOPIC
Bilirubin Urine: NEGATIVE
Nitrite: NEGATIVE
Protein, ur: NEGATIVE mg/dL
Specific Gravity, Urine: 1.005 (ref 1.005–1.030)
Urobilinogen, UA: 0.2 mg/dL (ref 0.0–1.0)

## 2011-12-02 LAB — SURGICAL PCR SCREEN: Staphylococcus aureus: NEGATIVE

## 2011-12-02 LAB — CBC
MCH: 30.8 pg (ref 26.0–34.0)
Platelets: 199 10*3/uL (ref 150–400)
RBC: 4.55 MIL/uL (ref 3.87–5.11)
RDW: 12.9 % (ref 11.5–15.5)
WBC: 4.6 10*3/uL (ref 4.0–10.5)

## 2011-12-02 LAB — HCG, SERUM, QUALITATIVE: Preg, Serum: NEGATIVE

## 2011-12-02 NOTE — H&P (Signed)
Yvette Joyce is an 51 y.o. female. This 51-year-old female is admitted for day surgery for hysteroscopy D&C removal of endometrial polyp and endometrial ablation she is one year status post removal of an endometrial polyp by Dr. Bovard. She returned her office for recurrent bleeding. Ultrasound was performed which showed on 11/17/2011, a 10 mm endometrial thickening without obvious masses outside the uterine cavity. She has 2 small fibroids on the wall of the uterus 2.3 cm in maximum diameter, on the right anterior uterus. Last years polyp was benign. She is considered postmenopausal with FSH  84.6  Pertinent Gynecological History: Menses: post-menopausal Bleeding: Slight, due to the polyp2 Contraception:  DES exposure: unknown Blood transfusions: none Sexually transmitted diseases: no past history Previous GYN Procedures: DNC and polypectomy by Dr. Bovard 2012  Last mammogram: normal Date:  Last pap: normal Date: December 2012  OB History: G0, P0   Menstrual History: Menarche age: 12 Patient's last menstrual period was 11/25/2011.    Past Medical History  Diagnosis Date  . Hypothyroidism   . Headache   . Constipation     Past Surgical History  Procedure Date  . Fibroids removed from uterus   . Cosmetic eye surgery   . Colonoscopy 09/24/2011    Procedure: COLONOSCOPY;  Surgeon: Sandi M Fields, MD;  Location: [facility];  Service: Endoscopy;  Laterality: N/A;  9:30 AM    Family History  Problem Relation Age of Onset  . Colon cancer Maternal Uncle     Social History:  reports that she has never smoked. She does not have any smokeless tobacco history on file. She reports that she drinks about .6 ounces of alcohol per week. She reports that she does not use illicit drugs.  Allergies: No Known Allergies   (Not in a hospital admission)  Review of Systems  Constitutional: Negative.     Last menstrual period 11/25/2011. Physical ExamPhysical Examination: General  appearance - alert, well appearing, and in no distress and anxious Mental status - alert, oriented to person, place, and time, normal mood, behavior, speech, dress, motor activity, and thought processes Chest - clear to auscultation, no wheezes, rales or rhonchi, symmetric air entry Heart - normal rate and regular rhythm Abdomen - soft, nontender, nondistended, no masses or organomegaly Pelvic  VULVA: normal appearing vulva with no masses, tenderness or lesions, VAGINA: normal appearing vagina with normal color and discharge, no lesions,  CERVIX: Smooth fleshy polyp protruding through the cervix likely originating high in the uterus based on prior ultrasound  UTERUS: uterus is normal size, shape, consistency and nontender, anteverted, mobile, polyp protrudes from cervix , ADNEXA: normal adnexa in size, nontender and no masses, RECTAL: normal rectal, no masses Extremities - peripheral pulses normal, no pedal edema, no clubbing or cyanosis  No results found for this or any previous visit (from the past 24 hour(s)).  No results found.  Assessment/Plan: Recurrent endometrial polyp prolapsing through the cervix and postmenopausal female plan hysteroscopy D&C endometrial ablation after polypectomy to be done as an outpatient Procedure reviewed with patient including risks potential complications patient with multiple questions which were answered to patient's apparent satisfaction [name] 12/02/2011, 11:11 AM  

## 2011-12-03 ENCOUNTER — Encounter (HOSPITAL_COMMUNITY): Payer: Self-pay | Admitting: *Deleted

## 2011-12-03 ENCOUNTER — Ambulatory Visit (HOSPITAL_COMMUNITY)
Admission: RE | Admit: 2011-12-03 | Discharge: 2011-12-03 | Disposition: A | Discharge status: Home / Self Care | Payer: Managed Care, Other (non HMO) | Source: Ambulatory Visit | Attending: Obstetrics and Gynecology | Admitting: Obstetrics and Gynecology

## 2011-12-03 ENCOUNTER — Encounter (HOSPITAL_COMMUNITY): Payer: Self-pay | Admitting: Anesthesiology

## 2011-12-03 ENCOUNTER — Ambulatory Visit (HOSPITAL_COMMUNITY): Payer: Managed Care, Other (non HMO) | Admitting: Anesthesiology

## 2011-12-03 ENCOUNTER — Encounter (HOSPITAL_COMMUNITY)
Admission: RE | Disposition: A | Discharge status: Home / Self Care | Payer: Self-pay | Source: Ambulatory Visit | Attending: Obstetrics and Gynecology

## 2011-12-03 DIAGNOSIS — Z01812 Encounter for preprocedural laboratory examination: Secondary | ICD-10-CM | POA: Insufficient documentation

## 2011-12-03 DIAGNOSIS — N840 Polyp of corpus uteri: Secondary | ICD-10-CM

## 2011-12-03 DIAGNOSIS — Z01810 Encounter for preprocedural cardiovascular examination: Secondary | ICD-10-CM | POA: Insufficient documentation

## 2011-12-03 DIAGNOSIS — N950 Postmenopausal bleeding: Secondary | ICD-10-CM | POA: Insufficient documentation

## 2011-12-03 HISTORY — PX: HYSTEROSCOPY WITH D & C: SHX1775

## 2011-12-03 HISTORY — PX: POLYPECTOMY: SHX5525

## 2011-12-03 SURGERY — POLYPECTOMY
Anesthesia: General | Wound class: Clean Contaminated

## 2011-12-03 MED ORDER — HYDROCODONE-ACETAMINOPHEN 5-500 MG PO TABS: 1.0000 | ORAL_TABLET | ORAL | Status: AC | PRN

## 2011-12-03 MED ORDER — MIDAZOLAM HCL 2 MG/2ML IJ SOLN
1.0000 mg | INTRAMUSCULAR | Status: DC | PRN
  Administered 2011-12-03 (×2): 2 mg via INTRAVENOUS

## 2011-12-03 MED ORDER — FENTANYL CITRATE 0.05 MG/ML IJ SOLN
INTRAMUSCULAR | Status: AC
  Filled 2011-12-03: qty 2

## 2011-12-03 MED ORDER — ACETAMINOPHEN 325 MG PO TABS
325.0000 mg | ORAL_TABLET | ORAL | Status: DC | PRN
  Administered 2011-12-03: 325 mg via ORAL

## 2011-12-03 MED ORDER — MIDAZOLAM HCL 2 MG/2ML IJ SOLN
INTRAMUSCULAR | Status: AC
  Administered 2011-12-03: 2 mg via INTRAVENOUS
  Filled 2011-12-03: qty 2

## 2011-12-03 MED ORDER — GLYCOPYRROLATE 0.2 MG/ML IJ SOLN
INTRAMUSCULAR | Status: AC
  Administered 2011-12-03: 0.2 mg via INTRAVENOUS
  Filled 2011-12-03: qty 1

## 2011-12-03 MED ORDER — LIDOCAINE HCL 1 % IJ SOLN
INTRAMUSCULAR | Status: DC | PRN
  Administered 2011-12-03: 30 mg via INTRADERMAL

## 2011-12-03 MED ORDER — BUPIVACAINE-EPINEPHRINE PF 0.5-1:200000 % IJ SOLN
INTRAMUSCULAR | Status: AC
  Filled 2011-12-03: qty 10

## 2011-12-03 MED ORDER — LACTATED RINGERS IV SOLN
INTRAVENOUS | Status: DC
  Administered 2011-12-03: 1000 mL via INTRAVENOUS

## 2011-12-03 MED ORDER — FENTANYL CITRATE 0.05 MG/ML IJ SOLN
INTRAMUSCULAR | Status: DC | PRN
  Administered 2011-12-03: 50 ug via INTRAVENOUS
  Administered 2011-12-03 (×2): 25 ug via INTRAVENOUS

## 2011-12-03 MED ORDER — BUPIVACAINE-EPINEPHRINE 0.5% -1:200000 IJ SOLN
INTRAMUSCULAR | Status: DC | PRN
  Administered 2011-12-03: 16 mL

## 2011-12-03 MED ORDER — GLYCOPYRROLATE 0.2 MG/ML IJ SOLN
0.2000 mg | Freq: Once | INTRAMUSCULAR | Status: AC
  Administered 2011-12-03: 0.2 mg via INTRAVENOUS

## 2011-12-03 MED ORDER — SODIUM CHLORIDE 0.9 % IR SOLN
Status: DC | PRN
Start: 2011-12-03 — End: 2011-12-03
  Administered 2011-12-03: 1000 mL

## 2011-12-03 MED ORDER — ONDANSETRON HCL 4 MG/2ML IJ SOLN
INTRAMUSCULAR | Status: AC
  Administered 2011-12-03: 4 mg via INTRAVENOUS
  Filled 2011-12-03: qty 2

## 2011-12-03 MED ORDER — PROPOFOL 10 MG/ML IV EMUL
INTRAVENOUS | Status: AC
  Filled 2011-12-03: qty 20

## 2011-12-03 MED ORDER — ONDANSETRON HCL 4 MG/2ML IJ SOLN: 4.0000 mg | Freq: Once | INTRAMUSCULAR | Status: DC | PRN

## 2011-12-03 MED ORDER — ONDANSETRON HCL 4 MG/2ML IJ SOLN
4.0000 mg | Freq: Once | INTRAMUSCULAR | Status: AC
  Administered 2011-12-03: 4 mg via INTRAVENOUS

## 2011-12-03 MED ORDER — ACETAMINOPHEN 325 MG PO TABS
ORAL_TABLET | ORAL | Status: AC
  Filled 2011-12-03: qty 1

## 2011-12-03 MED ORDER — LIDOCAINE HCL (PF) 1 % IJ SOLN
INTRAMUSCULAR | Status: AC
  Filled 2011-12-03: qty 5

## 2011-12-03 MED ORDER — FENTANYL CITRATE 0.05 MG/ML IJ SOLN: 25.0000 ug | INTRAMUSCULAR | Status: DC | PRN

## 2011-12-03 MED ORDER — PROPOFOL 10 MG/ML IV BOLUS
INTRAVENOUS | Status: DC | PRN
  Administered 2011-12-03: 120 mg via INTRAVENOUS

## 2011-12-03 SURGICAL SUPPLY — 27 items
BAG DECANTER FOR FLEXI CONT (MISCELLANEOUS) ×2 IMPLANT
BAG HAMPER (MISCELLANEOUS) ×2 IMPLANT
CATH ROBINSON RED A/P 16FR (CATHETERS) ×1 IMPLANT
CATH THERMACHOICE III (CATHETERS) ×2 IMPLANT
CLOTH BEACON ORANGE TIMEOUT ST (SAFETY) ×2 IMPLANT
COVER SURGICAL LIGHT HANDLE (MISCELLANEOUS) ×4 IMPLANT
FORMALIN 10 PREFIL 480ML (MISCELLANEOUS) ×1 IMPLANT
GLOVE ECLIPSE 7.0 STRL STRAW (GLOVE) ×1 IMPLANT
GLOVE ECLIPSE 9.0 STRL (GLOVE) ×2 IMPLANT
GLOVE EXAM NITRILE MD LF STRL (GLOVE) ×1 IMPLANT
GLOVE INDICATOR 7.0 STRL GRN (GLOVE) ×1 IMPLANT
GLOVE INDICATOR STER SZ 9 (GLOVE) ×2 IMPLANT
GOWN STRL REIN 3XL LVL4 (GOWN DISPOSABLE) ×2 IMPLANT
GOWN STRL REIN XL XLG (GOWN DISPOSABLE) ×4 IMPLANT
INST SET HYSTEROSCOPY (KITS) ×2 IMPLANT
IV D5W 500ML (IV SOLUTION) ×2 IMPLANT
IV NS IRRIG 3000ML ARTHROMATIC (IV SOLUTION) ×2 IMPLANT
KIT ROOM TURNOVER AP CYSTO (KITS) ×2 IMPLANT
MANIFOLD NEPTUNE II (INSTRUMENTS) ×2 IMPLANT
MANIFOLD NEPTUNE WASTE (CANNULA) ×2 IMPLANT
NS IRRIG 1000ML POUR BTL (IV SOLUTION) ×2 IMPLANT
PACK PERI GYN (CUSTOM PROCEDURE TRAY) ×2 IMPLANT
PAD ARMBOARD 7.5X6 YLW CONV (MISCELLANEOUS) ×2 IMPLANT
PAD TELFA 3X4 1S STER (GAUZE/BANDAGES/DRESSINGS) ×2 IMPLANT
SET BASIN LINEN APH (SET/KITS/TRAYS/PACK) ×2 IMPLANT
SET IRRIG Y TYPE TUR BLADDER L (SET/KITS/TRAYS/PACK) ×2 IMPLANT
SYR CONTROL 10ML LL (SYRINGE) ×1 IMPLANT

## 2011-12-03 NOTE — Anesthesia Procedure Notes (Signed)
Procedure Name: LMA Insertion Date/Time: 12/03/2011 1:06 PM Performed by: Glynn Octave E Pre-anesthesia Checklist: Patient identified, Patient being monitored, Emergency Drugs available, Timeout performed and Suction available Patient Re-evaluated:Patient Re-evaluated prior to inductionOxygen Delivery Method: Circle System Utilized Preoxygenation: Pre-oxygenation with 100% oxygen Intubation Type: IV induction Ventilation: Mask ventilation without difficulty LMA: LMA inserted LMA Size: 3.0 Number of attempts: 1 Placement Confirmation: positive ETCO2 and breath sounds checked- equal and bilateral

## 2011-12-03 NOTE — Anesthesia Preprocedure Evaluation (Signed)
Anesthesia Evaluation  Patient identified by MRN, date of birth, ID band Patient awake    Reviewed: Allergy & Precautions, H&P , NPO status , Patient's Chart, lab work & pertinent test results  History of Anesthesia Complications (+) PROLONGED EMERGENCE  Airway Mallampati: I TM Distance: >3 FB Neck ROM: Full    Dental No notable dental hx.    Pulmonary neg pulmonary ROS,    Pulmonary exam normal       Cardiovascular negative cardio ROS  Rhythm:Regular Rate:Normal     Neuro/Psych  Headaches, negative psych ROS   GI/Hepatic negative GI ROS, Neg liver ROS,   Endo/Other  Hypothyroidism   Renal/GU negative Renal ROS     Musculoskeletal negative musculoskeletal ROS (+)   Abdominal Normal abdominal exam  (+)   Peds  Hematology negative hematology ROS (+)   Anesthesia Other Findings   Reproductive/Obstetrics negative OB ROS                           Anesthesia Physical Anesthesia Plan  ASA: I  Anesthesia Plan: General   Post-op Pain Management:    Induction: Intravenous  Airway Management Planned: LMA  Additional Equipment:   Intra-op Plan:   Post-operative Plan: Extubation in OR  Informed Consent: I have reviewed the patients History and Physical, chart, labs and discussed the procedure including the risks, benefits and alternatives for the proposed anesthesia with the patient or authorized representative who has indicated his/her understanding and acceptance.     Plan Discussed with: CRNA  Anesthesia Plan Comments:         Anesthesia Quick Evaluation

## 2011-12-03 NOTE — Interval H&P Note (Signed)
History and Physical Interval Note:  12/03/2011 12:49 PM  Yvette Joyce  has presented today for surgery, with the diagnosis of postmenopausal bleeding endometrial polyp  The various methods of treatment have been discussed with the patient and family. After consideration of risks, benefits and other options for treatment, the patient has consented to  Procedure(s) (LRB): DILATATION & CURETTAGE/HYSTEROSCOPY WITH THERMACHOICE ABLATION (N/A) POLYPECTOMY (N/A) as a surgical intervention .  The patients' history has been reviewed, patient examined, no change in status, stable for surgery.  I have reviewed the patients' chart and labs.  Questions were answered to the patient's satisfaction.     Tilda Burrow

## 2011-12-03 NOTE — Transfer of Care (Signed)
Immediate Anesthesia Transfer of Care Note  Patient: Yvette Joyce  Procedure(s) Performed: Procedure(s) (LRB): DILATATION & CURETTAGE/HYSTEROSCOPY WITH THERMACHOICE ABLATION (N/A) POLYPECTOMY (N/A) DILATATION AND CURETTAGE /HYSTEROSCOPY (N/A)  Patient Location: PACU  Anesthesia Type: General  Level of Consciousness: awake, alert  and oriented  Airway & Oxygen Therapy: Patient Spontanous Breathing  Post-op Assessment: Report given to PACU RN  Post vital signs: Reviewed and stable  Complications: No apparent anesthesia complications

## 2011-12-03 NOTE — H&P (View-Only) (Signed)
Yvette Joyce is an 51 y.o. female. This 51 year old female is admitted for day surgery for hysteroscopy D&C removal of endometrial polyp and endometrial ablation she is one year status post removal of an endometrial polyp by Dr. Ellyn Hack. She returned her office for recurrent bleeding. Ultrasound was performed which showed on 11/17/2011, a 10 mm endometrial thickening without obvious masses outside the uterine cavity. She has 2 small fibroids on the wall of the uterus 2.3 cm in maximum diameter, on the right anterior uterus. Last years polyp was benign. She is considered postmenopausal with FSH  84.6  Pertinent Gynecological History: Menses: post-menopausal Bleeding: Slight, due to the polyp2 Contraception:  DES exposure: unknown Blood transfusions: none Sexually transmitted diseases: no past history Previous GYN Procedures: DNC and polypectomy by Dr. Ellyn Hack 2012  Last mammogram: normal Date:  Last pap: normal Date: December 2012  OB History: G0, P0   Menstrual History: Menarche age: 61 Patient's last menstrual period was 11/25/2011.    Past Medical History  Diagnosis Date  . Hypothyroidism   . Headache   . Constipation     Past Surgical History  Procedure Date  . Fibroids removed from uterus   . Cosmetic eye surgery   . Colonoscopy 09/24/2011    Procedure: COLONOSCOPY;  Surgeon: Arlyce Harman, MD;  Location: [facility];  Service: Endoscopy;  Laterality: N/A;  9:30 AM    Family History  Problem Relation Age of Onset  . Colon cancer Maternal Uncle     Social History:  reports that she has never smoked. She does not have any smokeless tobacco history on file. She reports that she drinks about .6 ounces of alcohol per week. She reports that she does not use illicit drugs.  Allergies: No Known Allergies   (Not in a hospital admission)  Review of Systems  Constitutional: Negative.     Last menstrual period 11/25/2011. Physical ExamPhysical Examination: General  appearance - alert, well appearing, and in no distress and anxious Mental status - alert, oriented to person, place, and time, normal mood, behavior, speech, dress, motor activity, and thought processes Chest - clear to auscultation, no wheezes, rales or rhonchi, symmetric air entry Heart - normal rate and regular rhythm Abdomen - soft, nontender, nondistended, no masses or organomegaly Pelvic  VULVA: normal appearing vulva with no masses, tenderness or lesions, VAGINA: normal appearing vagina with normal color and discharge, no lesions,  CERVIX: Smooth fleshy polyp protruding through the cervix likely originating high in the uterus based on prior ultrasound  UTERUS: uterus is normal size, shape, consistency and nontender, anteverted, mobile, polyp protrudes from cervix , ADNEXA: normal adnexa in size, nontender and no masses, RECTAL: normal rectal, no masses Extremities - peripheral pulses normal, no pedal edema, no clubbing or cyanosis  No results found for this or any previous visit (from the past 24 hour(s)).  No results found.  Assessment/Plan: Recurrent endometrial polyp prolapsing through the cervix and postmenopausal female plan hysteroscopy D&C endometrial ablation after polypectomy to be done as an outpatient Procedure reviewed with patient including risks potential complications patient with multiple questions which were answered to patient's apparent satisfaction Yvette Joyce V 12/02/2011, 11:11 AM

## 2011-12-03 NOTE — Brief Op Note (Signed)
12/03/2011  2:37 PM  PATIENT:  Yvette Joyce  51 y.o. female  PRE-OPERATIVE DIAGNOSIS:  postmenopausal bleeding endometrial polyp  POST-OPERATIVE DIAGNOSIS:  postmenopausal bleeding endometrial polyp  PROCEDURE:  Procedure(s) (LRB): POLYPECTOMY (N/A) DILATATION AND CURETTAGE /HYSTEROSCOPY (N/A)  SURGEON:  Surgeon(s) and Role:    * Tilda Burrow, MD - Primary  PHYSICIAN ASSISTANT:   ASSISTANTS:  [place] CST-FA   ANESTHESIA:   Local, general with LMA  EBL:  Total I/O In: 750 [I.V.:750] Out: 25 [Blood:25]  BLOOD ADMINISTERED:none  DRAINS: none   LOCAL MEDICATIONS USED:  MARCAINE   30 cc  SPECIMEN:  Source of Specimen:  Endometrial polyp and suspected fibroid beneath it  DISPOSITION OF SPECIMEN:  PATHOLOGY  COUNTS:  YES  TOURNIQUET:  * No tourniquets in log *  DICTATION: .Dragon Dictation Patient was taken to the operating room prepped and draped for vaginal procedure. Timeout was conducted. Cervix was grasped with single-tooth tenaculum and the polyp was visible sticking to 6 cm past the cervix. Uterus was sounded to 6 cm. The endocervix was dilated to 21 [place] and a rigid 30 hysteroscope could then be inserted and the tubal ostia on the right visualized. The central portion of the uterine cavity was obscured by a large firm fibrous specimen growing from the top of the uterine cavity. Operative hysteroscope could be used 2 partially resect the thick firm fibrous stalk from which the polyp extended. Hysteroscope was removed and a uterine packing forceps used to grasp the polyp and a bulge in was attempted. Proximal metaphysis was removed. Hysteroscopy showed the remnants which were thick and avascular and fibrous. These were resect a small bites with the operative hysteroscope until both tubal ostia could be seen and the base of the polyp/fibroid was smooth and even with the tubal ostia. Since this was felt to be a process rather than a simple polyp, patient was allowed  to go recovery room in good condition sponge and needle counts correct. The patient had received 30 cc of Marcaine as a paracervical block start of the case. Plans for endometrial ablation were abandoned. The patient has received 30 cc of Marcaine as a paracervical block at the start of the case.  PLAN OF CARE: Discharge to home after PACU  PATIENT DISPOSITION:  PACU - hemodynamically stable.   Delay start of Pharmacological VTE agent (>24hrs) due to surgical blood loss or risk of bleeding: not applicable

## 2011-12-03 NOTE — Op Note (Signed)
See operative note details included in the brief operative note 

## 2011-12-03 NOTE — Anesthesia Postprocedure Evaluation (Signed)
  Anesthesia Post-op Note  Patient: Yvette Joyce  Procedure(s) Performed: Procedure(s) (LRB): DILATATION & CURETTAGE/HYSTEROSCOPY WITH THERMACHOICE ABLATION (N/A) POLYPECTOMY (N/A) DILATATION AND CURETTAGE /HYSTEROSCOPY (N/A)  Patient Location: PACU  Anesthesia Type: General  Level of Consciousness: awake  Airway and Oxygen Therapy: Patient Spontanous Breathing and Patient connected to face mask oxygen  Post-op Pain: none  Post-op Assessment: Post-op Vital signs reviewed, Patient's Cardiovascular Status Stable, Respiratory Function Stable, Patent Airway and No signs of Nausea or vomiting  Post-op Vital Signs: Reviewed and stable  Complications: No apparent anesthesia complications

## 2011-12-07 ENCOUNTER — Encounter (HOSPITAL_COMMUNITY): Payer: Self-pay | Admitting: Obstetrics and Gynecology

## 2012-04-19 ENCOUNTER — Other Ambulatory Visit (HOSPITAL_COMMUNITY)
Admission: RE | Admit: 2012-04-19 | Discharge: 2012-04-19 | Disposition: A | Discharge status: Home / Self Care | Payer: Managed Care, Other (non HMO) | Source: Ambulatory Visit | Attending: Obstetrics and Gynecology | Admitting: Obstetrics and Gynecology

## 2012-04-19 ENCOUNTER — Other Ambulatory Visit: Payer: Self-pay | Admitting: Obstetrics and Gynecology

## 2012-04-19 DIAGNOSIS — Z01419 Encounter for gynecological examination (general) (routine) without abnormal findings: Secondary | ICD-10-CM | POA: Insufficient documentation

## 2012-06-29 ENCOUNTER — Encounter (HOSPITAL_COMMUNITY): Payer: Self-pay

## 2012-07-04 ENCOUNTER — Other Ambulatory Visit (HOSPITAL_COMMUNITY): Payer: Self-pay | Admitting: Obstetrics and Gynecology

## 2012-07-04 ENCOUNTER — Encounter (HOSPITAL_COMMUNITY)
Admission: RE | Admit: 2012-07-04 | Discharge: 2012-07-04 | Disposition: A | Discharge status: Home / Self Care | Payer: Managed Care, Other (non HMO) | Source: Ambulatory Visit | Attending: Obstetrics and Gynecology | Admitting: Obstetrics and Gynecology

## 2012-07-04 ENCOUNTER — Encounter (HOSPITAL_COMMUNITY): Payer: Self-pay

## 2012-07-04 LAB — HCG, SERUM, QUALITATIVE: Preg, Serum: NEGATIVE

## 2012-07-04 LAB — CBC
Hemoglobin: 14.4 g/dL (ref 12.0–15.0)
RBC: 4.51 MIL/uL (ref 3.87–5.11)
WBC: 5 10*3/uL (ref 4.0–10.5)

## 2012-07-04 LAB — BASIC METABOLIC PANEL
GFR calc Af Amer: >90 mL/min (ref >90)
GFR calc non Af Amer: >90 mL/min (ref >90)
Glucose, Bld: 81 mg/dL (ref 70–99)
Potassium: 3.8 mEq/L (ref 3.5–5.1)
Sodium: 139 mEq/L (ref 135–145)

## 2012-07-04 LAB — SURGICAL PCR SCREEN: MRSA, PCR: NEGATIVE

## 2012-07-04 MED ORDER — MAGNESIUM CITRATE PO SOLN: 300.0000 mL | Freq: Once | ORAL | Status: DC

## 2012-07-04 NOTE — Patient Instructions (Addendum)
20 Yvette Joyce  07/04/2012   Your procedure is scheduled on:  07/11/2012  Report to [facility] at  615  AM.  Call this number if you have problems the morning of surgery: [phone]   Remember:   Do not eat food:After Midnight.  May have clear liquids:until Midnight .    Take these medicines the morning of surgery with A SIP OF WATER: synthroid   Do not wear jewelry, make-up or nail polish.  Do not wear lotions, powders, or perfumes.   Do not shave 48 hours prior to surgery. Men may shave face and neck.  Do not bring valuables to the hospital.  Contacts, dentures or bridgework may not be worn into surgery.  Leave suitcase in the car. After surgery it may be brought to your room.  For patients admitted to the hospital, checkout time is 11:00 AM the day of discharge.   Patients discharged the day of surgery will not be allowed to drive home.  Name and phone number of your driver: family  Special Instructions: Shower using CHG 2 nights before surgery and the night before surgery.  If you shower the day of surgery use CHG.  Use special wash - you have one bottle of CHG for all showers.  You should use approximately 1/3 of the bottle for each shower.   Please read over the following fact sheets that you were given: Pain Booklet, Coughing and Deep Breathing, Blood Transfusion Information, MRSA Information, Surgical Site Infection Prevention, Anesthesia Post-op Instructions and Care and Recovery After Surgery Supracervical Hysterectomy A supracervical hysterectomy is minimally invasive surgery to remove the top part of the uterus, but not the cervix. This surgery can be performed by making a large cut (incision) in the abdomen. It can also be done with a thin, lighted tube (laparoscope) inserted into 2 small incisions in the lower abdomen. Your fallopian tubes and ovaries can be removed (bilateral salpingo-oopherectomy) during this surgery as well. If a supracervical hysterectomy is  started and it is not safe to continue, the laparoscopic surgery will be converted to an open abdominal surgery. You will not have menstrual periods or be able to get pregnant after having this surgery. If a bilateral salpingo-oopherectomy was performed before menopause, you will go through a sudden (abrupt) menopause. This can be helped with hormone medicines. Benefits of minimally invasive surgery include:  Less pain.  Less risk of blood loss.  Less risk of infection.  Quicker return to normal activities.  Usually a 1 night stay in the hospital.  Overall patient satisfaction. LET YOUR CAREGIVER KNOW ABOUT:  Any history of abnormal Pap tests.  Allergies to food or medicine.  Medicines taken, including vitamins, herbs, eyedrops, over-the-counter medicines, and creams.  Use of steroids (by mouth or creams).  Previous problems with anesthetics or numbing medicines.  History of bleeding problems or blood clots.  Previous surgery.  Other health problems, including diabetes and kidney problems.  Any infections or colds you may have developed.  Symptoms of irregular or heavy periods, weight loss, or urinary or bowel changes. RISKS AND COMPLICATIONS   Bleeding.  Blood clots in the legs or lung.  Infection.  Injury to surrounding organs.  Problems with anesthesia.  Risk of conversion to an open abdominal incision.  Early menopause symptoms (hot flashes, night sweats, insomnia).  Additional surgery later to remove the cervix if you have problems with the cervix. BEFORE THE PROCEDURE  Ask your caregiver about changing or stopping  your regular medicines.  Do not take aspirin or blood thinners (anticoagulants) for 1 week before the surgery, or as told by your caregiver.  Do not eat or drink anything for 8 hours before the surgery, or as told by your caregiver.  Quit smoking if you smoke.  Arrange for a ride home after surgery and for someone to help you at home  during recovery. PROCEDURE   You will be given an antibiotic medicine.  An intravenous (IV) line will be placed in your arm. You will be given medicine to make you sleep (general anesthetic).  A gas (carbon dioxide) will be used to inflate your abdomen. This will allow your surgeon to look inside your abdomen, perform your surgery, and treat any other problems found if necessary.  Three or four small incisions (often less than  inch) will be made in your abdomen. One of these incisions will be made in the area of your belly button (navel). The laparoscope will be inserted into the incision. Your surgeon will look through the laparoscope while doing your procedure.  Other surgical instruments will be inserted through the other incisions.  The uterus will be cut into small pieces and removed through the small incisions.  Your incisions will be closed. AFTER THE PROCEDURE   The gas will be released from inside your abdomen.  You will be taken to the recovery area where a nurse will watch and check your progress. Once you are awake, stable, and taking fluids well, without other problems, you will return to your room or be allowed to go home.  There is usually minimal discomfort following the surgery because the incisions are so small.  You will be given pain medicine while you are in the hospital and for when you go home.  Try to have someone with you for the first 3 to 5 days after you go home.  Follow up with your surgeon in 2 to 4 weeks after surgery to evaluate your progress. Document Released: 01/19/2008 Document Revised: 10/25/2011 Document Reviewed: 03/19/2011 [facility] Patient Information 2013 [place], [place]. PATIENT INSTRUCTIONS POST-ANESTHESIA  IMMEDIATELY FOLLOWING SURGERY:  Do not drive or operate machinery for the first twenty four hours after surgery.  Do not make any important decisions for twenty four hours after surgery or while taking narcotic pain medications or  sedatives.  If you develop intractable nausea and vomiting or a severe headache please notify your doctor immediately.  FOLLOW-UP:  Please make an appointment with your surgeon as instructed. You do not need to follow up with anesthesia unless specifically instructed to do so.  WOUND CARE INSTRUCTIONS (if applicable):  Keep a dry clean dressing on the anesthesia/puncture wound site if there is drainage.  Once the wound has quit draining you may leave it open to air.  Generally you should leave the bandage intact for twenty four hours unless there is drainage.  If the epidural site drains for more than 36-48 hours please call the anesthesia department.  QUESTIONS?:  Please feel free to call your physician or the hospital operator if you have any questions, and they will be happy to assist you.

## 2012-07-08 LAB — TYPE AND SCREEN: ABO/RH(D): AB POS

## 2012-07-10 NOTE — H&P (Signed)
Yvette Joyce is an 51 y.o. female. She is admitted for abdominal supracervical hysterectomy due to recurrent postmenopausal bleeding despite recent removal of an endometrial polyp 12/03/2011 which was benign. She had a prior polypectomy at a prior facility. She declines consideration of endometrial ablation. She now desires removal of tubes and ovaries as a part of the procedure. Supracervical hysterectomy is planned. Pap smear is normal 04/19/2012  Pertinent Gynecological History: Menses: post-menopausal Bleeding: post menopausal bleeding Contraception: post menopausal status DES exposure: unknown Blood transfusions: none Sexually transmitted diseases: no past history Previous GYN Procedures: Endometrial polypectomy x2, benign  Last mammogram: normal Date:  Last pap: normal Date: 05/09/2012 OB History: G0, P0   Menstrual History: Menarche age: 86 Patient's last menstrual period was 11/25/2011.    Past Medical History  Diagnosis Date  . Hypothyroidism   . Headache   . Constipation   . Complication of anesthesia     "hard to wake up"    Past Surgical History  Procedure Date  . Fibroids removed from uterus   . Cosmetic eye surgery   . Colonoscopy 09/24/2011    Procedure: COLONOSCOPY;  Surgeon: Arlyce Harman, MD;  Location: [facility];  Service: Endoscopy;  Laterality: N/A;  9:30 AM  . Nasal septum surgery   . Polypectomy 12/03/2011    Procedure: POLYPECTOMY;  Surgeon: Tilda Burrow, MD;  Location: [facility] ORS;  Service: Gynecology;  Laterality: N/A;  resection of polyp  . Hysteroscopy w/d&c 12/03/2011    Procedure: DILATATION AND CURETTAGE /HYSTEROSCOPY;  Surgeon: Tilda Burrow, MD;  Location: [facility] ORS;  Service: Gynecology;  Laterality: N/A;    Family History  Problem Relation Age of Onset  . Colon cancer Maternal Uncle     Social History:  reports that she has never smoked. She does not have any smokeless tobacco history on file. She reports that she drinks about .6  ounces of alcohol per week. She reports that she does not use illicit drugs.  Allergies: No Known Allergies  No prescriptions prior to admission    ROS essentially negative  Last menstrual period 11/25/2011. Physical Exam Physical Examination: General appearance - alert, well appearing, and in no distress, oriented to person, place, and time, normal appearing weight and anxious Mental status - alert, oriented to person, place, and time, normal mood, behavior, speech, dress, motor activity, and thought processes Eyes - pupils equal and reactive, extraocular eye movements intact Mouth - mucous membranes moist, pharynx normal without lesions and good airway Chest - clear to auscultation, no wheezes, rales or rhonchi, symmetric air entry Heart - normal rate and regular rhythm Abdomen - soft, nontender, nondistended, no masses or organomegaly Pelvic - VULVA: normal appearing vulva with no masses, tenderness or lesions, VAGINA: normal appearing vagina with normal color and discharge, no lesions, CERVIX: normal appearing cervix without discharge or lesions, UTERUS: uterus is normal size, shape, consistency and nontender, anteverted, mobile, good support, ADNEXA: normal adnexa in size, nontender and no masses, PAP: thin-prep method, normal 9 2013 Neurological - alert, oriented, normal speech, no focal findings or movement disorder noted Musculoskeletal - no muscular tenderness noted Extremities - peripheral pulses normal, no pedal edema, no clubbing or cyanosis, Homan's sign negative bilaterally Skin - normal coloration and turgor, no rashes, no suspicious skin lesions noted  CBC    Component Value Date/Time   WBC 5.0 07/04/2012 1110   RBC 4.51 07/04/2012 1110   HGB 14.4 07/04/2012 1110   HCT 42.8 07/04/2012 1110  PLT 215 07/04/2012 1110   MCV 94.9 07/04/2012 1110   MCH 31.9 07/04/2012 1110   MCHC 33.6 07/04/2012 1110   RDW 12.9 07/04/2012 1110     No results found for this or any  previous visit (from the past 24 hour(s)).  No results found.  Assessment/Plan: Recurrent endometrial polyp with subsequent postmenopausal bleeding declining hysteroscopy or endometrial ablation. Scheduled for abdominal supracervical hysterectomy by laparoscopic technique, with removal of tubes and ovaries and preservation of cervix on 07/11/2012  Loucile Posner V 07/10/2012, 6:00 PM

## 2012-07-11 ENCOUNTER — Encounter (HOSPITAL_COMMUNITY): Payer: Self-pay | Admitting: Anesthesiology

## 2012-07-11 ENCOUNTER — Observation Stay (HOSPITAL_COMMUNITY)
Admission: RE | Admit: 2012-07-11 | Discharge: 2012-07-12 | Disposition: A | Discharge status: Home / Self Care | Payer: Managed Care, Other (non HMO) | Source: Ambulatory Visit | Attending: Obstetrics and Gynecology | Admitting: Obstetrics and Gynecology

## 2012-07-11 ENCOUNTER — Encounter (HOSPITAL_COMMUNITY): Payer: Self-pay

## 2012-07-11 ENCOUNTER — Ambulatory Visit (HOSPITAL_COMMUNITY): Payer: Managed Care, Other (non HMO) | Admitting: Anesthesiology

## 2012-07-11 ENCOUNTER — Other Ambulatory Visit (HOSPITAL_COMMUNITY): Payer: Managed Care, Other (non HMO)

## 2012-07-11 ENCOUNTER — Encounter (HOSPITAL_COMMUNITY)
Admission: RE | Disposition: A | Discharge status: Home / Self Care | Payer: Self-pay | Source: Ambulatory Visit | Attending: Obstetrics and Gynecology

## 2012-07-11 DIAGNOSIS — Z90710 Acquired absence of both cervix and uterus: Secondary | ICD-10-CM

## 2012-07-11 DIAGNOSIS — D259 Leiomyoma of uterus, unspecified: Principal | ICD-10-CM | POA: Diagnosis present

## 2012-07-11 DIAGNOSIS — Z01812 Encounter for preprocedural laboratory examination: Secondary | ICD-10-CM | POA: Insufficient documentation

## 2012-07-11 DIAGNOSIS — N840 Polyp of corpus uteri: Secondary | ICD-10-CM

## 2012-07-11 DIAGNOSIS — N950 Postmenopausal bleeding: Secondary | ICD-10-CM | POA: Insufficient documentation

## 2012-07-11 DIAGNOSIS — R9389 Abnormal findings on diagnostic imaging of other specified body structures: Secondary | ICD-10-CM | POA: Insufficient documentation

## 2012-07-11 HISTORY — PX: LAPAROSCOPIC SALPINGO OOPHERECTOMY: SHX5927

## 2012-07-11 HISTORY — PX: LAPAROSCOPIC SUPRACERVICAL HYSTERECTOMY: SHX5399

## 2012-07-11 LAB — URINALYSIS, ROUTINE W REFLEX MICROSCOPIC
Ketones, ur: NEGATIVE mg/dL
Nitrite: NEGATIVE
Protein, ur: NEGATIVE mg/dL

## 2012-07-11 LAB — URINE MICROSCOPIC-ADD ON

## 2012-07-11 SURGERY — HYSTERECTOMY, SUPRACERVICAL, LAPAROSCOPIC
Anesthesia: General | Wound class: Clean Contaminated

## 2012-07-11 MED ORDER — KCL IN DEXTROSE-NACL 20-5-0.45 MEQ/L-%-% IV SOLN
INTRAVENOUS | Status: DC
  Administered 2012-07-11: 11:00:00 via INTRAVENOUS
  Administered 2012-07-11 – 2012-07-12 (×2): 1000 mL via INTRAVENOUS

## 2012-07-11 MED ORDER — ONDANSETRON HCL 4 MG/2ML IJ SOLN
4.0000 mg | Freq: Once | INTRAMUSCULAR | Status: AC
  Administered 2012-07-11: 4 mg via INTRAVENOUS

## 2012-07-11 MED ORDER — KETOROLAC TROMETHAMINE 30 MG/ML IJ SOLN
30.0000 mg | Freq: Four times a day (QID) | INTRAMUSCULAR | Status: AC
  Administered 2012-07-11 – 2012-07-12 (×3): 30 mg via INTRAVENOUS
  Filled 2012-07-11 (×2): qty 1

## 2012-07-11 MED ORDER — LIDOCAINE HCL (PF) 1 % IJ SOLN
INTRAMUSCULAR | Status: AC
  Filled 2012-07-11: qty 5

## 2012-07-11 MED ORDER — PROPOFOL 10 MG/ML IV EMUL
INTRAVENOUS | Status: AC
  Filled 2012-07-11: qty 20

## 2012-07-11 MED ORDER — NEOSTIGMINE METHYLSULFATE 1 MG/ML IJ SOLN
INTRAMUSCULAR | Status: DC | PRN
  Administered 2012-07-11: 3 mg via INTRAVENOUS

## 2012-07-11 MED ORDER — BUPIVACAINE HCL (PF) 0.5 % IJ SOLN
INTRAMUSCULAR | Status: AC
  Filled 2012-07-11: qty 30

## 2012-07-11 MED ORDER — FLEET ENEMA 7-19 GM/118ML RE ENEM
1.0000 | ENEMA | Freq: Once | RECTAL | Status: AC
  Administered 2012-07-11: 1 via RECTAL
  Filled 2012-07-11: qty 1

## 2012-07-11 MED ORDER — ZOLPIDEM TARTRATE 5 MG PO TABS: 5.0000 mg | ORAL_TABLET | Freq: Every evening | ORAL | Status: DC | PRN

## 2012-07-11 MED ORDER — HYDROMORPHONE HCL PF 1 MG/ML IJ SOLN: 0.2000 mg | INTRAMUSCULAR | Status: DC | PRN

## 2012-07-11 MED ORDER — LACTATED RINGERS IV SOLN
INTRAVENOUS | Status: DC
  Administered 2012-07-11 (×2): via INTRAVENOUS

## 2012-07-11 MED ORDER — PRENATAL MULTIVITAMIN CH
1.0000 | ORAL_TABLET | Freq: Every day | ORAL | Status: DC
  Administered 2012-07-11 – 2012-07-12 (×2): 1 via ORAL
  Filled 2012-07-11 (×4): qty 1

## 2012-07-11 MED ORDER — LIDOCAINE-EPINEPHRINE (PF) 1 %-1:200000 IJ SOLN
INTRAMUSCULAR | Status: AC
  Filled 2012-07-11: qty 10

## 2012-07-11 MED ORDER — ONDANSETRON HCL 4 MG PO TABS
4.0000 mg | ORAL_TABLET | Freq: Four times a day (QID) | ORAL | Status: DC | PRN
  Administered 2012-07-12: 4 mg via ORAL
  Filled 2012-07-11: qty 1

## 2012-07-11 MED ORDER — BUPIVACAINE HCL (PF) 0.5 % IJ SOLN
INTRAMUSCULAR | Status: DC | PRN
  Administered 2012-07-11: 15 mL

## 2012-07-11 MED ORDER — FENTANYL CITRATE 0.05 MG/ML IJ SOLN
INTRAMUSCULAR | Status: AC
  Filled 2012-07-11: qty 2

## 2012-07-11 MED ORDER — ROCURONIUM BROMIDE 50 MG/5ML IV SOLN
INTRAVENOUS | Status: AC
  Filled 2012-07-11: qty 1

## 2012-07-11 MED ORDER — PROPOFOL 10 MG/ML IV BOLUS
INTRAVENOUS | Status: DC | PRN
  Administered 2012-07-11: 150 mg via INTRAVENOUS

## 2012-07-11 MED ORDER — KETOROLAC TROMETHAMINE 30 MG/ML IJ SOLN: 30.0000 mg | Freq: Four times a day (QID) | INTRAMUSCULAR | Status: AC

## 2012-07-11 MED ORDER — ONDANSETRON HCL 4 MG/2ML IJ SOLN
INTRAMUSCULAR | Status: AC
  Filled 2012-07-11: qty 2

## 2012-07-11 MED ORDER — OXYCODONE-ACETAMINOPHEN 5-325 MG PO TABS
1.0000 | ORAL_TABLET | ORAL | Status: DC | PRN
  Administered 2012-07-11: 1 via ORAL
  Filled 2012-07-11: qty 1

## 2012-07-11 MED ORDER — CEFAZOLIN SODIUM-DEXTROSE 2-3 GM-% IV SOLR
INTRAVENOUS | Status: DC | PRN
  Administered 2012-07-11: 2 g via INTRAVENOUS

## 2012-07-11 MED ORDER — LEVOTHYROXINE SODIUM 75 MCG PO TABS
75.0000 ug | ORAL_TABLET | Freq: Every day | ORAL | Status: DC
  Administered 2012-07-12: 75 ug via ORAL
  Filled 2012-07-11: qty 1

## 2012-07-11 MED ORDER — GLYCOPYRROLATE 0.2 MG/ML IJ SOLN
INTRAMUSCULAR | Status: DC | PRN
  Administered 2012-07-11: 0.4 mg via INTRAVENOUS

## 2012-07-11 MED ORDER — IBUPROFEN 600 MG PO TABS: 600.0000 mg | ORAL_TABLET | Freq: Four times a day (QID) | ORAL | Status: DC | PRN

## 2012-07-11 MED ORDER — FENTANYL CITRATE 0.05 MG/ML IJ SOLN
INTRAMUSCULAR | Status: AC
  Filled 2012-07-11: qty 5

## 2012-07-11 MED ORDER — DOCUSATE SODIUM 100 MG PO CAPS
100.0000 mg | ORAL_CAPSULE | Freq: Two times a day (BID) | ORAL | Status: DC
  Administered 2012-07-12: 100 mg via ORAL
  Filled 2012-07-11: qty 1

## 2012-07-11 MED ORDER — CEFAZOLIN SODIUM-DEXTROSE 2-3 GM-% IV SOLR
INTRAVENOUS | Status: AC
  Filled 2012-07-11: qty 50

## 2012-07-11 MED ORDER — ONDANSETRON HCL 4 MG/2ML IJ SOLN
4.0000 mg | Freq: Once | INTRAMUSCULAR | Status: AC | PRN
  Administered 2012-07-11: 4 mg via INTRAVENOUS

## 2012-07-11 MED ORDER — ONDANSETRON HCL 4 MG/2ML IJ SOLN: 4.0000 mg | Freq: Four times a day (QID) | INTRAMUSCULAR | Status: DC | PRN

## 2012-07-11 MED ORDER — LIDOCAINE HCL 1 % IJ SOLN
INTRAMUSCULAR | Status: DC | PRN
  Administered 2012-07-11: 50 mg via INTRADERMAL

## 2012-07-11 MED ORDER — 0.9 % SODIUM CHLORIDE (POUR BTL) OPTIME
TOPICAL | Status: DC | PRN
  Administered 2012-07-11: 1000 mL

## 2012-07-11 MED ORDER — CEFAZOLIN SODIUM-DEXTROSE 2-3 GM-% IV SOLR: 2.0000 g | INTRAVENOUS | Status: DC

## 2012-07-11 MED ORDER — KETOROLAC TROMETHAMINE 30 MG/ML IJ SOLN
INTRAMUSCULAR | Status: AC
  Filled 2012-07-11: qty 1

## 2012-07-11 MED ORDER — MIDAZOLAM HCL 2 MG/2ML IJ SOLN
INTRAMUSCULAR | Status: AC
  Filled 2012-07-11: qty 2

## 2012-07-11 MED ORDER — FENTANYL CITRATE 0.05 MG/ML IJ SOLN
INTRAMUSCULAR | Status: DC | PRN
  Administered 2012-07-11: 100 ug via INTRAVENOUS
  Administered 2012-07-11: 50 ug via INTRAVENOUS
  Administered 2012-07-11: 100 ug via INTRAVENOUS

## 2012-07-11 MED ORDER — MIDAZOLAM HCL 5 MG/5ML IJ SOLN
INTRAMUSCULAR | Status: DC | PRN
  Administered 2012-07-11: 2 mg via INTRAVENOUS

## 2012-07-11 MED ORDER — METHYLENE BLUE 1 % INJ SOLN
INTRAMUSCULAR | Status: AC
  Filled 2012-07-11: qty 1

## 2012-07-11 MED ORDER — MIDAZOLAM HCL 2 MG/2ML IJ SOLN
1.0000 mg | INTRAMUSCULAR | Status: DC | PRN
  Administered 2012-07-11: 2 mg via INTRAVENOUS

## 2012-07-11 MED ORDER — ROCURONIUM BROMIDE 100 MG/10ML IV SOLN
INTRAVENOUS | Status: DC | PRN
  Administered 2012-07-11: 40 mg via INTRAVENOUS

## 2012-07-11 MED ORDER — FENTANYL CITRATE 0.05 MG/ML IJ SOLN
25.0000 ug | INTRAMUSCULAR | Status: DC | PRN
  Administered 2012-07-11 (×4): 50 ug via INTRAVENOUS

## 2012-07-11 SURGICAL SUPPLY — 67 items
ADH SKN CLS APL DERMABOND .7 (GAUZE/BANDAGES/DRESSINGS)
APPLIER CLIP UNV 5X34 EPIX (ENDOMECHANICALS) ×3 IMPLANT
APR XCLPCLP 20M/L UNV 34X5 (ENDOMECHANICALS) ×2
BAG HAMPER (MISCELLANEOUS) ×3 IMPLANT
BLADE LAPAROSCOPIC MORCELL KIT (BLADE) ×3 IMPLANT
BLADE SURG SZ11 CARB STEEL (BLADE) ×3 IMPLANT
CLOTH BEACON ORANGE TIMEOUT ST (SAFETY) ×3 IMPLANT
COVER LIGHT HANDLE STERIS (MISCELLANEOUS) ×6 IMPLANT
DERMABOND ADVANCED (GAUZE/BANDAGES/DRESSINGS)
DERMABOND ADVANCED .7 DNX12 (GAUZE/BANDAGES/DRESSINGS) IMPLANT
DISSECTOR BLUNT TIP ENDO 5MM (MISCELLANEOUS) IMPLANT
DRAPE PROXIMA HALF (DRAPES) ×3 IMPLANT
DRESSING COVERLET 3X1 FLEXIBLE (GAUZE/BANDAGES/DRESSINGS) ×11 IMPLANT
ELECT REM PT RETURN 9FT ADLT (ELECTROSURGICAL) ×3
ELECTRODE REM PT RTRN 9FT ADLT (ELECTROSURGICAL) ×2 IMPLANT
FILTER SMOKE EVAC LAPAROSHD (FILTER) ×3 IMPLANT
FORMALIN 10 PREFIL 480ML (MISCELLANEOUS) ×3 IMPLANT
GAUZE SPONGE 4X4 16PLY XRAY LF (GAUZE/BANDAGES/DRESSINGS) ×2 IMPLANT
GLOVE BIOGEL PI IND STRL 7.0 (GLOVE) IMPLANT
GLOVE BIOGEL PI INDICATOR 7.0 (GLOVE) ×4
GLOVE ECLIPSE 8.0 STRL XLNG CF (GLOVE) ×1 IMPLANT
GLOVE ECLIPSE 9.0 STRL (GLOVE) ×4 IMPLANT
GLOVE INDICATOR 8.0 STRL GRN (GLOVE) ×1 IMPLANT
GLOVE INDICATOR STER SZ 9 (GLOVE) ×3 IMPLANT
GLOVE SS BIOGEL STRL SZ 6.5 (GLOVE) IMPLANT
GLOVE SUPERSENSE BIOGEL SZ 6.5 (GLOVE) ×1
GOWN STRL REIN 3XL LVL4 (GOWN DISPOSABLE) ×3 IMPLANT
GOWN STRL REIN XL XLG (GOWN DISPOSABLE) ×6 IMPLANT
INST SET LAPROSCOPIC GYN AP (KITS) ×3 IMPLANT
IV NS IRRIG 3000ML ARTHROMATIC (IV SOLUTION) ×2 IMPLANT
KIT ROOM TURNOVER AP CYSTO (KITS) ×3 IMPLANT
MANIFOLD NEPTUNE II (INSTRUMENTS) ×3 IMPLANT
NDL HYPO 18GX1.5 BLUNT FILL (NEEDLE) ×2 IMPLANT
NDL HYPO 25X1 1.5 SAFETY (NEEDLE) IMPLANT
NDL INSUFFLATION 14GA 120MM (NEEDLE) ×2 IMPLANT
NEEDLE HYPO 18GX1.5 BLUNT FILL (NEEDLE) ×3 IMPLANT
NEEDLE HYPO 25X1 1.5 SAFETY (NEEDLE) ×3 IMPLANT
NEEDLE INSUFFLATION 14GA 120MM (NEEDLE) ×3 IMPLANT
NS IRRIG 1000ML POUR BTL (IV SOLUTION) ×3 IMPLANT
PACK PERI GYN (CUSTOM PROCEDURE TRAY) ×3 IMPLANT
PAD ARMBOARD 7.5X6 YLW CONV (MISCELLANEOUS) ×3 IMPLANT
SCALPEL HARMONIC ACE (MISCELLANEOUS) ×3 IMPLANT
SET BASIN LINEN APH (SET/KITS/TRAYS/PACK) ×3 IMPLANT
SET TUBE IRRIG SUCTION NO TIP (IRRIGATION / IRRIGATOR) ×3 IMPLANT
SOL PREP PROV IODINE SCRUB 4OZ (MISCELLANEOUS) ×3 IMPLANT
SOLUTION ANTI FOG 6CC (MISCELLANEOUS) ×3 IMPLANT
STRIP CLOSURE SKIN 1/4X3 (GAUZE/BANDAGES/DRESSINGS) ×3 IMPLANT
SUT CHROMIC 0 CT 1 (SUTURE) ×2 IMPLANT
SUT CHROMIC 2 0 CT 1 (SUTURE) ×2 IMPLANT
SUT VIC AB 2-0 CT2 27 (SUTURE) ×2 IMPLANT
SUT VIC AB 3-0 SH 27 (SUTURE)
SUT VIC AB 3-0 SH 27X BRD (SUTURE) ×2 IMPLANT
SUT VICRYL 0 UR6 27IN ABS (SUTURE) ×3 IMPLANT
SUT VICRYL AB 3-0 FS1 BRD 27IN (SUTURE) ×3 IMPLANT
SYR CONTROL 10ML LL (SYRINGE) ×3 IMPLANT
SYRINGE 10CC LL (SYRINGE) ×3 IMPLANT
TRAY FOLEY CATH 14FR (SET/KITS/TRAYS/PACK) ×3 IMPLANT
TRAY FOLEY CATH 16FR SILVER (SET/KITS/TRAYS/PACK) ×2 IMPLANT
TROCAR Z-THAD FIOS HNDL 12X100 (TROCAR) ×3 IMPLANT
TROCAR Z-THRD FIOS HNDL 11X100 (TROCAR) ×3 IMPLANT
TROCAR Z-THREAD FIOS 5X100MM (TROCAR) ×3 IMPLANT
TROCAR Z-THREAD OPTICAL 5X100M (TROCAR) ×3 IMPLANT
TROCAR Z-THREAD SLEEVE 11X100 (TROCAR) ×3 IMPLANT
TUBING DYE INJECTION 900-617 (MISCELLANEOUS) IMPLANT
TUBING HI FLO HEAT INSUFFLATOR (IRRIGATION / IRRIGATOR) ×3 IMPLANT
TUBING INSUFFLATION HIGH FLOW (TUBING) ×3 IMPLANT
WARMER LAPAROSCOPE (MISCELLANEOUS) ×3 IMPLANT

## 2012-07-11 NOTE — Progress Notes (Signed)
From OR. Arousing. Anxious. Reassurance given.

## 2012-07-11 NOTE — Anesthesia Preprocedure Evaluation (Signed)
Anesthesia Evaluation  Patient identified by MRN, date of birth, ID band Patient awake    Reviewed: Allergy & Precautions, H&P , NPO status , Patient's Chart, lab work & pertinent test results  History of Anesthesia Complications (+) PROLONGED EMERGENCE  Airway Mallampati: I TM Distance: >3 FB Neck ROM: Full    Dental No notable dental hx. (+) Teeth Intact   Pulmonary neg pulmonary ROS,    Pulmonary exam normal       Cardiovascular negative cardio ROS  Rhythm:Regular Rate:Normal     Neuro/Psych  Headaches, negative psych ROS   GI/Hepatic negative GI ROS, Neg liver ROS,   Endo/Other  Hypothyroidism   Renal/GU negative Renal ROS     Musculoskeletal negative musculoskeletal ROS (+)   Abdominal Normal abdominal exam  (+)   Peds  Hematology negative hematology ROS (+)   Anesthesia Other Findings   Reproductive/Obstetrics negative OB ROS                           Anesthesia Physical Anesthesia Plan  ASA: I  Anesthesia Plan: General   Post-op Pain Management:    Induction: Intravenous  Airway Management Planned: Oral ETT  Additional Equipment:   Intra-op Plan:   Post-operative Plan: Extubation in OR  Informed Consent: I have reviewed the patients History and Physical, chart, labs and discussed the procedure including the risks, benefits and alternatives for the proposed anesthesia with the patient or authorized representative who has indicated his/her understanding and acceptance.     Plan Discussed with: CRNA  Anesthesia Plan Comments:         Anesthesia Quick Evaluation

## 2012-07-11 NOTE — Progress Notes (Signed)
Resting quietly. resp adequate/nonlabored. Sips of sprite given. Tolerated well. States "that helps a lot." returns to sleep.voices no c/o at this time.

## 2012-07-11 NOTE — Anesthesia Postprocedure Evaluation (Addendum)
  Anesthesia Post-op Note  Patient: Yvette Joyce  Procedure(s) Performed: Procedure(s) (LRB) with comments: LAPAROSCOPIC SUPRACERVICAL HYSTERECTOMY (N/A) LAPAROSCOPIC SALPINGO OOPHORECTOMY (Bilateral)  Patient Location: PACU  Anesthesia Type:General  Level of Consciousness: awake, alert , oriented and patient cooperative  Airway and Oxygen Therapy: Patient Spontanous Breathing  Post-op Pain: 5 /10, moderate  Post-op Assessment: Post-op Vital signs reviewed, Patient's Cardiovascular Status Stable, Respiratory Function Stable, Patent Airway, No signs of Nausea or vomiting and Pain level controlled  Post-op Vital Signs: Reviewed and stable  Complications: No apparent anesthesia complications  07/12/12  Discharged today.

## 2012-07-11 NOTE — Brief Op Note (Signed)
07/11/2012  9:16 AM  PATIENT:  Yvette Joyce  51 y.o. female  PRE-OPERATIVE DIAGNOSIS:  uterine fibroids postmenopausal bleeding thickened endometrium  POST-OPERATIVE DIAGNOSIS:  uterine fibroids postmenopausal bleeding thickened endometrium  PROCEDURE:  Procedure(s) (LRB) with comments: LAPAROSCOPIC SUPRACERVICAL HYSTERECTOMY (N/A) LAPAROSCOPIC SALPINGO OOPHORECTOMY (Bilateral)  SURGEON:  Surgeon(s) and Role:    * Tilda Burrow, MD - Primary    * Lazaro Arms, MD - Assisting  PHYSICIAN ASSISTANT:   ASSISTANTS: none    ANESTHESIA:   general  EBL:  Total I/O In: 1600 [I.V.:1600] Out: 75 [Urine:50; Blood:25]  BLOOD ADMINISTERED:none  DRAINS: none   LOCAL MEDICATIONS USED:  MARCAINE    and Amount: 18 ml  SPECIMEN:  Source of Specimen:  uterus, tubes and ovaries  DISPOSITION OF SPECIMEN:  PATHOLOGY  COUNTS:  YES  TOURNIQUET:  * No tourniquets in log *  DICTATION: .Dragon Dictation She was taken to the operating room prepped and draped for combined abdominal and vaginal procedure with Hulka tenaculum attached to the cervix and a Foley catheter in the bladder ML was conducted and IV antibiotics administered and procedure initiated. Infraumbilical vertical skin incision was performed, as well as right lower quadrant and left lower quadrant small incisions to allow insertion of the trochars, 10 mm in the right lower quadrant 10 mm of the umbilicus and 5 mm in the left lower quadrant. She was directed to the pelvis where uterus had a small fibroid on the right side of the lower uterine segment and was deviated to the left eye a shorter round ligament on the left side. Ovaries appeared grossly normal . Round ligaments were taken down on each side with harmonic scalpel, then the endopelvic ligament identified just lateral to the ovary on each side, in down wit a Monarch scalpel without difficulty and with good hemostasis. Bladder flap was developed anteriorly. Broad ligament  was taken down sufficiently that the uterine vessels could be identified and were clamped on either side with hemoclips. The vessels were then transected above the clips, and attention directed to the uterine segment which was transected using the harmonic scalpel to core into it and take the lower uterine segment apart and small bites. The cervical  stump was hemostatic. The uterine morcellator was placed through the right lower quadrant trocar site, and placed carefully anterior and in the pelvis where the easily visualized confirmed as functional.. The uterus tubes and ovaries were then elevated and morcellated and brought out in 3 or 4 specimens. A small fragment of and ovary was identified in the pelvis and was extracted. Inspection the pelvis showed no additional tissue fragments. Bleeding was minimal. Pedicles were inspected confirmed again as hemostatic and procedure considered successfully completed with deflation of the abdomen, fascial closure in the right lower quadrant and umbilicus with interrupted 0 Vicryl, and then subcuticular 3-0 Vicryl used to close all 3 skin incisions. EBL less than 25 cc patient tolerated procedure well went to recovery room in stable condition she'll be observed overnight for pain management.  PLAN OF CARE: Admit for overnight observation  PATIENT DISPOSITION:  PACU - hemodynamically stable.   Delay start of Pharmacological VTE agent (>24hrs) due to surgical blood loss or risk of bleeding: not applicable

## 2012-07-11 NOTE — Transfer of Care (Signed)
Immediate Anesthesia Transfer of Care Note  Patient: Yvette Joyce  Procedure(s) Performed: Procedure(s) (LRB) with comments: LAPAROSCOPIC SUPRACERVICAL HYSTERECTOMY (N/A) LAPAROSCOPIC SALPINGO OOPHORECTOMY (Bilateral)  Patient Location: PACU  Anesthesia Type:General  Level of Consciousness: awake and patient cooperative  Airway & Oxygen Therapy: Patient Spontanous Breathing and Patient connected to face mask oxygen  Post-op Assessment: Report given to PACU RN, Post -op Vital signs reviewed and stable and Patient moving all extremities  Post vital signs: Reviewed and stable  Complications: No apparent anesthesia complications

## 2012-07-11 NOTE — Interval H&P Note (Signed)
History and Physical Interval Note:  07/11/2012 7:17 AM  Yvette Joyce  has presented today for surgery, with the diagnosis of uterine fibroids postmenopausal bleeding thickened endometrium  The various methods of treatment have been discussed with the patient and family. After consideration of risks, benefits and other options for treatment, the patient has consented to  Procedure(s) (LRB) with comments: LAPAROSCOPIC SUPRACERVICAL HYSTERECTOMY (N/A) LAPAROSCOPIC SALPINGO OOPHORECTOMY (Bilateral) as a surgical intervention .  The patient's history has been reviewed, patient examined, no change in status, stable for surgery.  I have reviewed the patient's chart and labs.  Questions were answered to the patient's satisfaction.   The  Patient was surprised to be asked for fleets enema this morning, and was hesitant to proceed with this new change.  She has been given the option of postponing procedure if desired, and she declines that option.  Tilda Burrow

## 2012-07-11 NOTE — Anesthesia Procedure Notes (Signed)
Procedure Name: Intubation Date/Time: 07/11/2012 7:47 AM Performed by: Despina Hidden Pre-anesthesia Checklist: Emergency Drugs available, Suction available, Patient identified and Patient being monitored Patient Re-evaluated:Patient Re-evaluated prior to inductionOxygen Delivery Method: Circle system utilized Preoxygenation: Pre-oxygenation with 100% oxygen Intubation Type: IV induction Ventilation: Mask ventilation without difficulty Laryngoscope Size: Mac and 3 Grade View: Grade I Tube type: Oral Tube size: 7.0 mm Number of attempts: 1 Airway Equipment and Method: Stylet Placement Confirmation: ETT inserted through vocal cords under direct vision,  positive ETCO2 and breath sounds checked- equal and bilateral Secured at: 20 cm Tube secured with: Tape Dental Injury: Teeth and Oropharynx as per pre-operative assessment

## 2012-07-11 NOTE — Op Note (Signed)
See operative details included in the brief op note 

## 2012-07-12 ENCOUNTER — Encounter (HOSPITAL_COMMUNITY): Payer: Self-pay | Admitting: Obstetrics and Gynecology

## 2012-07-12 LAB — CBC WITH DIFFERENTIAL/PLATELET
Basophils Relative: 1 % (ref 0–1)
Eosinophils Absolute: 0.2 10*3/uL (ref 0.0–0.7)
HCT: 38.1 % (ref 36.0–46.0)
Hemoglobin: 12.4 g/dL (ref 12.0–15.0)
MCH: 31.6 pg (ref 26.0–34.0)
MCHC: 32.5 g/dL (ref 30.0–36.0)
Monocytes Absolute: 0.3 10*3/uL (ref 0.1–1.0)
Monocytes Relative: 7 % (ref 3–12)

## 2012-07-12 LAB — URINE CULTURE: Colony Count: NO GROWTH

## 2012-07-12 LAB — BASIC METABOLIC PANEL
BUN: 5 mg/dL — ABNORMAL LOW (ref 6–23)
CO2: 31 mEq/L (ref 19–32)
Chloride: 105 mEq/L (ref 96–112)
Creatinine, Ser: 0.73 mg/dL (ref 0.50–1.10)

## 2012-07-12 MED ORDER — HYDROCODONE-ACETAMINOPHEN 5-500 MG PO TABS: 1.0000 | ORAL_TABLET | ORAL | Status: DC | PRN

## 2012-07-12 MED ORDER — POLYETHYLENE GLYCOL 3350 17 GM/SCOOP PO POWD: 17.0000 g | Freq: Every day | ORAL | Status: DC

## 2012-07-12 NOTE — Progress Notes (Signed)
Pt being discharged home today. All discharge paperwork has been given to patient and gone over. All questions/concerns have been addressed. IV is out, pt is in NAD, and assessment is unchanged from this morning. Pt will be accompanied downstairs by staff and friend via wheelchair. All belongings are with pt.

## 2012-07-12 NOTE — Progress Notes (Signed)
UR Chart Review Completed  

## 2012-07-12 NOTE — Discharge Summary (Signed)
1 Day Post-Op Procedure(s) (LRB): LAPAROSCOPIC SUPRACERVICAL HYSTERECTOMY (N/A) LAPAROSCOPIC SALPINGO OOPHORECTOMY (Bilateral)  Yvette Burrow, MD Physician Signed Gynecology H&P 07/10/2012 6:00 PM   Yvette Joyce is an 51 y.o. female. She is admitted for abdominal supracervical hysterectomy due to recurrent postmenopausal bleeding despite recent removal of an endometrial polyp 12/03/2011 which was benign. She had a prior polypectomy at a prior facility. She declines consideration of endometrial ablation. She now desires removal of tubes and ovaries as a part of the procedure. Supracervical hysterectomy is planned. Pap smear is normal 04/19/2012   The patient underwent surgery 07/11/12 without complications, ebl 25 cc Subjective: Patient reports incisional pain and tolerating PO.  Pain is a 2/10.   Objective: I have reviewed patient's vital signs, labs and patient appears stable..  General: alert, cooperative and no distress GI: soft, non-tender; bowel sounds normal; no masses,  no organomegaly, incision: clean, dry and intact and dressing placed over umbilicus after surgery is now dry Extremities: Homans sign is negative, no sign of DVT  Assessment: s/p Procedure(s) (LRB) with comments: LAPAROSCOPIC SUPRACERVICAL HYSTERECTOMY (N/A) LAPAROSCOPIC SALPINGO OOPHORECTOMY (Bilateral): progressing well  Plan: Encourage ambulation Discharge home on motrin, hydrocodone, MIralax  LOS: 1 day    Yvette Joyce 07/12/2012, 7:25 AM

## 2012-07-12 NOTE — Addendum Note (Signed)
Addendum  created 07/12/12 1453 by Moshe Salisbury, CRNA   Modules edited:Notes Section

## 2012-07-14 ENCOUNTER — Other Ambulatory Visit (HOSPITAL_COMMUNITY): Payer: Managed Care, Other (non HMO)

## 2012-07-17 ENCOUNTER — Other Ambulatory Visit: Payer: Self-pay | Admitting: Obstetrics and Gynecology

## 2012-07-17 DIAGNOSIS — Z1231 Encounter for screening mammogram for malignant neoplasm of breast: Secondary | ICD-10-CM

## 2012-09-01 ENCOUNTER — Ambulatory Visit
Admission: RE | Admit: 2012-09-01 | Discharge: 2012-09-01 | Disposition: A | Discharge status: Home / Self Care | Payer: Managed Care, Other (non HMO) | Source: Ambulatory Visit | Attending: Obstetrics and Gynecology | Admitting: Obstetrics and Gynecology

## 2012-09-01 DIAGNOSIS — Z1231 Encounter for screening mammogram for malignant neoplasm of breast: Secondary | ICD-10-CM

## 2012-09-06 ENCOUNTER — Other Ambulatory Visit: Payer: Self-pay | Admitting: Obstetrics and Gynecology

## 2012-09-06 DIAGNOSIS — R928 Other abnormal and inconclusive findings on diagnostic imaging of breast: Secondary | ICD-10-CM

## 2012-09-08 ENCOUNTER — Ambulatory Visit
Admission: RE | Admit: 2012-09-08 | Discharge: 2012-09-08 | Disposition: A | Discharge status: Home / Self Care | Payer: Managed Care, Other (non HMO) | Source: Ambulatory Visit | Attending: Obstetrics and Gynecology | Admitting: Obstetrics and Gynecology

## 2012-09-08 DIAGNOSIS — R928 Other abnormal and inconclusive findings on diagnostic imaging of breast: Secondary | ICD-10-CM

## 2013-01-29 ENCOUNTER — Other Ambulatory Visit: Payer: Self-pay | Admitting: Obstetrics and Gynecology

## 2013-01-29 DIAGNOSIS — R928 Other abnormal and inconclusive findings on diagnostic imaging of breast: Secondary | ICD-10-CM

## 2013-02-14 ENCOUNTER — Ambulatory Visit
Admission: RE | Admit: 2013-02-14 | Discharge: 2013-02-14 | Disposition: A | Discharge status: Home / Self Care | Payer: Managed Care, Other (non HMO) | Source: Ambulatory Visit | Attending: Obstetrics and Gynecology | Admitting: Obstetrics and Gynecology

## 2013-02-14 DIAGNOSIS — R928 Other abnormal and inconclusive findings on diagnostic imaging of breast: Secondary | ICD-10-CM

## 2013-04-06 ENCOUNTER — Telehealth: Payer: Self-pay | Admitting: Obstetrics & Gynecology

## 2013-04-06 NOTE — Telephone Encounter (Signed)
Spoke with pt. Having urinary frequency, has noticed some blood in urine. Started today. No burning with urination. Advised to call primary dr and see if they could see pt today or call something in for her. Pt voiced understanding. JSY

## 2013-04-12 ENCOUNTER — Encounter: Payer: Self-pay | Admitting: Adult Health

## 2013-04-12 ENCOUNTER — Ambulatory Visit (INDEPENDENT_AMBULATORY_CARE_PROVIDER_SITE_OTHER): Payer: Managed Care, Other (non HMO) | Admitting: Adult Health

## 2013-04-12 VITALS — BP 128/80 | Ht 66.5 in | Wt 143.0 lb

## 2013-04-12 DIAGNOSIS — R319 Hematuria, unspecified: Secondary | ICD-10-CM

## 2013-04-12 HISTORY — DX: Hematuria, unspecified: R31.9

## 2013-04-12 LAB — POCT URINALYSIS DIPSTICK: Glucose, UA: NEGATIVE

## 2013-04-12 NOTE — Assessment & Plan Note (Signed)
Refer to Dr Annabell Howells at [facility]

## 2013-04-12 NOTE — Patient Instructions (Addendum)
Hematuria, Adult Hematuria (blood in your urine) can be caused by a bladder infection (cystitis), kidney infection (pyelonephritis), prostate infection (prostatitis), or kidney stone. Infections will usually respond to antibiotics (medications which kill germs), and a kidney stone will usually pass through your urine without further treatment. If you were put on antibiotics, take all the medicine until gone. You may feel better in a few days, but take all of your medicine or the infection may not respond and become more difficult to treat. If antibiotics were not given, an infection did not cause the blood in the urine. A further work up to find out the reason may be needed. HOME CARE INSTRUCTIONS   Drink lots of fluid, 3 to 4 quarts a day. If you have been diagnosed with an infection, cranberry juice is especially recommended, in addition to large amounts of water.  Avoid caffeine, tea, and carbonated beverages, because they tend to irritate the bladder.  Avoid alcohol as it may irritate the prostate.  Only take over-the-counter or prescription medicines for pain, discomfort, or fever as directed by your caregiver.  If you have been diagnosed with a kidney stone follow your caregivers instructions regarding straining your urine to catch the stone. TO PREVENT FURTHER INFECTIONS:  Empty the bladder often. Avoid holding urine for long periods of time.  After a bowel movement, women should cleanse front to back. Use each tissue only once.  Empty the bladder before and after sexual intercourse if you are a female.  Return to your caregiver if you develop back pain, fever, nausea (feeling sick to your stomach), vomiting, or your symptoms (problems) are not better in 3 days. Return sooner if you are getting worse. If you have been requested to return for further testing make sure to keep your appointments. If an infection is not the cause of blood in your urine, X-rays may be required. Your caregiver  will discuss this with you. SEEK IMMEDIATE MEDICAL CARE IF:   You have a persistent fever over 102 F (38.9 C).  You develop severe vomiting and are unable to keep the medication down.  You develop severe back or abdominal pain despite taking your medications.  You begin passing a large amount of blood or clots in your urine.  You feel extremely weak or faint, or pass out. MAKE SURE YOU:   Understand these instructions.  Will watch your condition.  Will get help right away if you are not doing well or get worse. Document Released: 08/02/2005 Document Revised: 10/25/2011 Document Reviewed: 03/21/2008 [facility] Patient Information 2014 [place], [place]. Finish cipro Refer to Dr Annabell Howells at [facility]

## 2013-04-12 NOTE — Progress Notes (Signed)
Subjective:     Patient ID: Yvette Joyce, female   DOB: Jun 25, 1961, 52 y.o.   MRN: [id number]  HPI Yvette Joyce is in complaining of having peed blood 8/22 was seen at urgent care and given Cipro.she had 3+ blood and 1+ protein there with negative culture.she has had to pee often and has pressure that radiates down right leg, she is not seeing any blood now.  Review of Systems Positives in HPI Reviewed past medical,surgical, social and family history. Reviewed medications and allergies.     Objective:   Physical Exam BP 128/80  Ht 5' 6.5" (1.689 m)  Wt 143 lb (64.864 kg)  BMI 22.74 kg/m2  LMP 11/25/2011   urine trace blood and protein. Skin warm and dry.Pelvic: external genitalia is normal in appearance, vagina:atrophic, cervix:atrophic, uterus: absent adnexa: no masses or tenderness noted No CVAT  Assessment:     Hematuria    Plan:     Finish cipro Refer to Dr Annabell Howells Review handout on hematuria

## 2013-04-17 ENCOUNTER — Telehealth: Payer: Self-pay | Admitting: Adult Health

## 2013-04-17 NOTE — Telephone Encounter (Signed)
Left message the urologist did not have fax for referral they said,  So re faxed it and if you have not got a cal by in am call them at [phone].

## 2013-05-11 ENCOUNTER — Other Ambulatory Visit: Payer: Self-pay | Admitting: Urology

## 2013-05-11 ENCOUNTER — Ambulatory Visit (INDEPENDENT_AMBULATORY_CARE_PROVIDER_SITE_OTHER): Payer: Managed Care, Other (non HMO) | Admitting: Urology

## 2013-05-11 DIAGNOSIS — Z87440 Personal history of urinary (tract) infections: Secondary | ICD-10-CM

## 2013-05-11 DIAGNOSIS — R310 Gross hematuria: Secondary | ICD-10-CM

## 2013-05-16 ENCOUNTER — Ambulatory Visit (HOSPITAL_COMMUNITY)
Admission: RE | Admit: 2013-05-16 | Discharge: 2013-05-16 | Disposition: A | Discharge status: Home / Self Care | Payer: Managed Care, Other (non HMO) | Source: Ambulatory Visit | Attending: Urology | Admitting: Urology

## 2013-05-16 DIAGNOSIS — N390 Urinary tract infection, site not specified: Secondary | ICD-10-CM | POA: Insufficient documentation

## 2013-05-16 DIAGNOSIS — R310 Gross hematuria: Secondary | ICD-10-CM | POA: Insufficient documentation

## 2013-05-16 MED ORDER — IOHEXOL 300 MG/ML  SOLN
125.0000 mL | Freq: Once | INTRAMUSCULAR | Status: AC | PRN
  Administered 2013-05-16: 125 mL via INTRAVENOUS

## 2013-06-11 ENCOUNTER — Other Ambulatory Visit: Payer: Self-pay | Admitting: Adult Health

## 2013-06-15 ENCOUNTER — Ambulatory Visit (INDEPENDENT_AMBULATORY_CARE_PROVIDER_SITE_OTHER): Payer: Managed Care, Other (non HMO) | Admitting: Urology

## 2013-06-15 DIAGNOSIS — K838 Other specified diseases of biliary tract: Secondary | ICD-10-CM

## 2013-06-15 DIAGNOSIS — R310 Gross hematuria: Secondary | ICD-10-CM

## 2013-06-21 ENCOUNTER — Other Ambulatory Visit: Payer: Self-pay

## 2013-06-22 ENCOUNTER — Ambulatory Visit (INDEPENDENT_AMBULATORY_CARE_PROVIDER_SITE_OTHER): Payer: Managed Care, Other (non HMO) | Admitting: Gastroenterology

## 2013-06-22 ENCOUNTER — Encounter: Payer: Self-pay | Admitting: Gastroenterology

## 2013-06-22 ENCOUNTER — Other Ambulatory Visit: Payer: Self-pay | Admitting: Gastroenterology

## 2013-06-22 VITALS — BP 134/77 | HR 79 | Temp 97.6°F | Wt 149.2 lb

## 2013-06-22 DIAGNOSIS — K838 Other specified diseases of biliary tract: Secondary | ICD-10-CM

## 2013-06-22 NOTE — Patient Instructions (Signed)
COMPLETE MRCP NEXT WEEK.  FOLLOW A HIGH FIBER DIET. SEE INFO BELOW.  FOLLOW UP IN 1 YEAR. MERRY CHRISTMAS AND HAPPY NEW YEAR!

## 2013-06-22 NOTE — Assessment & Plan Note (Addendum)
LIVER ENZYMES NL AND PT ASYMPTOMATIC. DILATED IH/EH DUCTS MOST LIKELY DUE TO BENIGN CBD STRICTURE OR SPHINCTER OF ODDI DYSFUNCTION, LESS LIKELY CBD STONE, OR CHOLANGIOCA.  MRCP NEXT WEEK. DISCUSSED BENEFITS V. RISKS OF MRCP V. ERCP. CONSIDER HFP ONCE A YEAR IF NO LESION FOUND ON MRCP. OPV 1 YEAR

## 2013-06-22 NOTE — Progress Notes (Signed)
Subjective:    Patient ID: VASHON ARCH, female    DOB: [birth year], 52 y.o.   MRN: [id number]  Cassell Smiles., MD  HPI Micah Flesher to URGENT CARE BECAUSE SHE HAD A UTI ON A FRI. TWIN HAS SOD & WAS TREATED BY DR. Jena Gauss. HAD A UTI AND BLOOD IN HER URINE.  CX WAS POSITIVE. GOT ABX AND FELT BETTER. SAW DR. Annabell Howells NO MORE BLOOD IN THE URINE. SCAN SHOWED DILATED BILE DUCTS. TAKES MIRALAX DAILY.  W/O MIRALAX ONLY GIES ONCE A WEEK. NO ITCHING OR TURNING YELLOW. NO WEIGHT LOSS. OCCASIONAL SHARP PAIN IN LUQ AND LAST A FEW MINS AND IT'S GONE. STILL HAS GB. PT DENIES FEVER, CHILLS, BRBPR, nausea, vomiting, melena, diarrhea, abd pain, problems swallowing, problems with sedation, OR heartburn or indigestion.  Past Medical History  Diagnosis Date  . Hypothyroidism   . Headache(784.0)   . Constipation   . Complication of anesthesia     "hard to wake up"  . Hematuria 04/12/2013   Past Surgical History  Procedure Laterality Date  . Fibroids removed from uterus    . Cosmetic eye surgery    . Colonoscopy  09/24/2011    Procedure: COLONOSCOPY;  Surgeon: Arlyce Harman, MD;  Location: [facility];  Service: Endoscopy;  Laterality: N/A;  9:30 AM  . Nasal septum surgery    . Polypectomy  12/03/2011    Procedure: POLYPECTOMY;  Surgeon: Tilda Burrow, MD;  Location: [facility];  Service: Gynecology;  Laterality: N/A;  resection of polyp  . Hysteroscopy w/d&c  12/03/2011    Procedure: DILATATION AND CURETTAGE /HYSTEROSCOPY;  Surgeon: Tilda Burrow, MD;  Location: [facility];  Service: Gynecology;  Laterality: N/A;  . Laparoscopic supracervical hysterectomy  07/11/2012    Procedure: LAPAROSCOPIC SUPRACERVICAL HYSTERECTOMY;  Surgeon: Tilda Burrow, MD;  Location: [facility];  Service: Gynecology;  Laterality: N/A;  . Laparoscopic salpingo oopherectomy  07/11/2012    Procedure: LAPAROSCOPIC SALPINGO OOPHORECTOMY;  Surgeon: Tilda Burrow, MD;  Location: [facility];  Service: Gynecology;  Laterality: Bilateral;   No Known  Allergies  Current Outpatient Prescriptions  Medication Sig Dispense Refill  . Bimatoprost (LATISSE EX) Apply 1 application topically daily.      . Cholecalciferol (VITAMIN D PO) Take 1 tablet by mouth daily. Unsure of strength      . CYANOCOBALAMIN PO Take 1 tablet by mouth daily. Unsure of strength      . fish oil-omega-3 fatty acids 1000 MG capsule Take 2 g by mouth daily.      Marland Kitchen ibuprofen (ADVIL,MOTRIN) 200 MG tablet Take 400 mg by mouth every 6 (six) hours as needed. For pain      . levothyroxine (SYNTHROID, LEVOTHROID) 75 MCG tablet TAKE 1 TABLET BY MOUTH ONCE DAILY FOR THYROID.    . Multiple Vitamin (MULITIVITAMIN WITH MINERALS) TABS Take 1 tablet by mouth daily.    . polyethylene glycol powder (GLYCOLAX/MIRALAX) powder Take 17 g by mouth daily.    Marland Kitchen Specialty Vitamins Products (MAGNESIUM, AMINO ACID CHELATE,) 133 MG tablet Take 1 tablet by mouth daily.       Family History  Problem Relation Age of Onset  . Colon cancer Maternal Uncle   . Cancer Sister 44    breast   SISTER HAS SOD   History  Substance Use Topics  . Smoking status: Never Smoker   . Smokeless tobacco: Never Used  . Alcohol Use: 0.6 oz/week    1 Shots of liquor per week  Comment: 1-2 times a week       Review of Systems PER HPI OTHERWISE ALL SYSTEMS ARE NEGATIVE.    Objective:   Physical Exam  Vitals reviewed. Constitutional: She is oriented to person, place, and time. She appears well-nourished.  HENT:  Head: Normocephalic and atraumatic.  Mouth/Throat: Oropharynx is clear and moist. No oropharyngeal exudate.  Eyes: Pupils are equal, round, and reactive to light. No scleral icterus.  Neck: Normal range of motion. Neck supple.  Cardiovascular: Normal rate, regular rhythm and normal heart sounds.   Pulmonary/Chest: Effort normal and breath sounds normal. No respiratory distress.  Abdominal: Soft. Bowel sounds are normal. She exhibits no distension. There is no tenderness.  Musculoskeletal:  Normal range of motion. She exhibits no edema.  Lymphadenopathy:    She has no cervical adenopathy.  Neurological: She is alert and oriented to person, place, and time.  NO FOCAL DEFICITS   Psychiatric: She has a normal mood and affect.          Assessment & Plan:

## 2013-06-25 ENCOUNTER — Ambulatory Visit: Payer: Managed Care, Other (non HMO) | Admitting: Gastroenterology

## 2013-06-25 NOTE — Progress Notes (Signed)
cc'd to pcp 

## 2013-06-27 ENCOUNTER — Ambulatory Visit (HOSPITAL_COMMUNITY): Payer: Managed Care, Other (non HMO)

## 2013-06-29 ENCOUNTER — Other Ambulatory Visit: Payer: Self-pay | Admitting: Gastroenterology

## 2013-06-29 ENCOUNTER — Ambulatory Visit (HOSPITAL_COMMUNITY)
Admission: RE | Admit: 2013-06-29 | Discharge: 2013-06-29 | Disposition: A | Discharge status: Home / Self Care | Payer: Managed Care, Other (non HMO) | Source: Ambulatory Visit | Attending: Gastroenterology | Admitting: Gastroenterology

## 2013-06-29 ENCOUNTER — Encounter (HOSPITAL_COMMUNITY): Payer: Self-pay

## 2013-06-29 DIAGNOSIS — K838 Other specified diseases of biliary tract: Secondary | ICD-10-CM

## 2013-06-29 DIAGNOSIS — R109 Unspecified abdominal pain: Secondary | ICD-10-CM | POA: Insufficient documentation

## 2013-06-29 DIAGNOSIS — K5710 Diverticulosis of small intestine without perforation or abscess without bleeding: Secondary | ICD-10-CM | POA: Insufficient documentation

## 2013-06-29 MED ORDER — GADOBENATE DIMEGLUMINE 529 MG/ML IV SOLN
15.0000 mL | Freq: Once | INTRAVENOUS | Status: AC | PRN
  Administered 2013-06-29: 13 mL via INTRAVENOUS

## 2013-06-29 MED ORDER — SODIUM CHLORIDE 0.9 % IV SOLN
INTRAVENOUS | Status: AC
  Filled 2013-06-29: qty 150

## 2013-07-04 ENCOUNTER — Telehealth: Payer: Self-pay

## 2013-07-04 NOTE — Telephone Encounter (Signed)
PLEASE CALL PT. HER MRCP SHOWS A MILDLY DILATED CBD. HER PANCREAS AND GALLBLADDER ARE NORMAL, RECOMMEND HFP ONCE A YEAR. OPV IN ONE YEAR. CALL WITH QUESTIONS OR CONCERNS.

## 2013-07-04 NOTE — Telephone Encounter (Signed)
Pt left VM that she was calling for results of her MRI. I called and left VM that I have not heard from Dr. Darrick Penna. We allow 7 business days for results, and if anything urgent is wrong the radiologist calls and informs the physician. I will let Dr. Darrick Penna know that she called and I will call her when I get those results.

## 2013-07-05 NOTE — Telephone Encounter (Signed)
Results Cc to PCP  

## 2013-07-05 NOTE — Telephone Encounter (Signed)
LMOM to call.

## 2013-07-05 NOTE — Telephone Encounter (Signed)
Pt returned call and was informed.  

## 2013-07-05 NOTE — Telephone Encounter (Signed)
Reminder in epic °

## 2013-07-19 ENCOUNTER — Other Ambulatory Visit: Payer: Self-pay

## 2013-07-19 DIAGNOSIS — Z1231 Encounter for screening mammogram for malignant neoplasm of breast: Secondary | ICD-10-CM

## 2013-08-01 ENCOUNTER — Other Ambulatory Visit (HOSPITAL_COMMUNITY): Payer: Self-pay | Admitting: Orthopaedic Surgery

## 2013-08-01 DIAGNOSIS — R52 Pain, unspecified: Secondary | ICD-10-CM

## 2013-08-06 ENCOUNTER — Ambulatory Visit (HOSPITAL_COMMUNITY)
Admission: RE | Admit: 2013-08-06 | Discharge: 2013-08-06 | Disposition: A | Discharge status: Home / Self Care | Payer: Managed Care, Other (non HMO) | Source: Ambulatory Visit | Attending: Orthopaedic Surgery | Admitting: Orthopaedic Surgery

## 2013-08-06 DIAGNOSIS — R937 Abnormal findings on diagnostic imaging of other parts of musculoskeletal system: Secondary | ICD-10-CM | POA: Insufficient documentation

## 2013-08-06 DIAGNOSIS — M19019 Primary osteoarthritis, unspecified shoulder: Secondary | ICD-10-CM | POA: Insufficient documentation

## 2013-08-06 DIAGNOSIS — R52 Pain, unspecified: Secondary | ICD-10-CM

## 2013-08-06 DIAGNOSIS — M25519 Pain in unspecified shoulder: Secondary | ICD-10-CM | POA: Insufficient documentation

## 2013-08-16 HISTORY — PX: BREAST CYST ASPIRATION: SHX578

## 2013-08-20 ENCOUNTER — Ambulatory Visit (HOSPITAL_COMMUNITY)
Admission: RE | Admit: 2013-08-20 | Discharge: 2013-08-20 | Disposition: A | Discharge status: Home / Self Care | Payer: Managed Care, Other (non HMO) | Source: Ambulatory Visit | Attending: Orthopaedic Surgery | Admitting: Orthopaedic Surgery

## 2013-08-20 DIAGNOSIS — M25619 Stiffness of unspecified shoulder, not elsewhere classified: Secondary | ICD-10-CM | POA: Insufficient documentation

## 2013-08-20 DIAGNOSIS — IMO0001 Reserved for inherently not codable concepts without codable children: Secondary | ICD-10-CM | POA: Insufficient documentation

## 2013-08-20 DIAGNOSIS — M25519 Pain in unspecified shoulder: Secondary | ICD-10-CM | POA: Insufficient documentation

## 2013-08-20 NOTE — Evaluation (Signed)
Occupational Therapy Evaluation  Patient Details  Name: Yvette Joyce MRN: [id number] Date of Birth: Mar 21, 1961  Today's Date: 08/20/2013 Time: 7616-0737 OT Time Calculation (min): 40 min OT Eval 850-905 15' Manual therapy 905-925 20'  Visit#: 1 of 6  Re-eval: 09/17/13  Assessment Diagnosis: Left Rotator Cuff Tendonitis Next MD Visit: unknown Prior Therapy: n/a    Past Medical History:  Past Medical History  Diagnosis Date  . Hypothyroidism   . Headache(784.0)   . Constipation   . Complication of anesthesia     "hard to wake up"  . Hematuria 04/12/2013   Past Surgical History:  Past Surgical History  Procedure Laterality Date  . Fibroids removed from uterus    . Cosmetic eye surgery    . Colonoscopy  09/24/2011    Procedure: COLONOSCOPY;  Surgeon: Dorothyann Peng, MD;  [facility];  Service: Endoscopy;  Laterality: N/A;  9:30 AM  . Nasal septum surgery    . Polypectomy  12/03/2011    Procedure: POLYPECTOMY;  Surgeon: Jonnie Kind, MD;  Location: [facility];  Service: Gynecology;  Laterality: N/A;  resection of polyp  . Hysteroscopy w/d&c  12/03/2011    Procedure: DILATATION AND CURETTAGE /HYSTEROSCOPY;  Surgeon: Jonnie Kind, MD;  Location: [facility];  Service: Gynecology;  Laterality: N/A;  . Laparoscopic supracervical hysterectomy  07/11/2012    Procedure: LAPAROSCOPIC SUPRACERVICAL HYSTERECTOMY;  Surgeon: Jonnie Kind, MD;  Location: [facility];  Service: Gynecology;  Laterality: N/A;  . Laparoscopic salpingo oopherectomy  07/11/2012    Procedure: LAPAROSCOPIC SALPINGO OOPHORECTOMY;  Surgeon: Jonnie Kind, MD;  Location: [facility];  Service: Gynecology;  Laterality: Bilateral;    Subjective  S:  My shoulder has been hurting for several months.  I thought it was going to get better on its own and it didnt. Pertinent History: Ms. Yvette Joyce has been experieincing pain and decreased mobility in her left shoulder for several months.  She consulted with Dr. Durward Fortes, had  an MRI which detected tendonitis, and received a cortisone injection.  She has been referred to occupational therapy for evaluation and treatment. Limitations: progress as tolerated Special Tests: DASH scored 22.72 with ideal score being 0. Patient Stated Goals: I want to reach overhead and golf without my shoulder hurting.  Pain Assessment Pain Score: 2  Pain Location: Shoulder Pain Orientation: Left Pain Type: Acute pain Effect of Pain on Daily Activities: pain increases to 7/10 when at work.   Precautions/Restrictions  Precautions Precautions: None Restrictions Weight Bearing Restrictions: No  Balance Screening Balance Screen Has the patient fallen in the past 6 months: No Has the patient had a decrease in activity level because of a fear of falling? : No Is the patient reluctant to leave their home because of a fear of falling? 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 ) Comments: enjoys golfing  Assessment ADL/Vision/Perception ADL ADL Comments: difficulty reaching overhead and dfficulty hanging filters at work Dominant Hand: Right Vision - History Baseline Vision: No visual deficits  Cognition/Observation Cognition Overall Cognitive Status: Within Functional Limits for tasks assessed Observation/Other Assessments Observations: scapular stability is good-  Sensation/Coordination/Edema Sensation Light Touch: Appears Intact Coordination Gross Motor Movements are Fluid and Coordinated: Yes Fine Motor Movements are Fluid and Coordinated: Yes  Additional Assessments LUE AROM (degrees) LUE Overall  AROM Comments: assessed in seated, ER/IR with shoulder abdcuted to 90 Left Shoulder Flexion: 135 Degrees Left Shoulder ABduction: 135 Degrees Left Shoulder  Internal Rotation: 60 Degrees Left Shoulder External Rotation: 70 Degrees LUE PROM (degrees) LUE Overall PROM Comments: assessed in supine, PROM is WFL LUE Strength LUE Overall Strength Comments: assessed in seated ER/IR with shoulder adducted  Left Shoulder Flexion:  (4+/5) Left Shoulder ABduction:  (4+/5) Left Shoulder Internal Rotation:  (4+/5) Left Shoulder External Rotation:  (4+/5) Palpation Palpation: moderate fascial restrictions noted in left upper arm, scapular, and shoulder region       Occupational Therapy Assessment and Plan OT Assessment and Plan Clinical Impression Statement: A:  Patient presents with decreased AROM and strength in her left shoulder region and increased pain and fascial restrictions. These deficits are effecting her ability to complete daily and work tasks.  Pt will benefit from skilled therapeutic intervention in order to improve on the following deficits: Decreased range of motion;Decreased strength;Pain;Increased muscle spasms;Increased fascial restricitons (727.61, 719.41, 728.87) Rehab Potential: Good OT Frequency: Min 1X/week OT Duration: 6 weeks OT Treatment/Interventions: Self-care/ADL training;Therapeutic exercise;Manual therapy;Therapeutic activities;Patient/family education OT Plan: P:  Skilled OT intervention to improve AROM, scapular stability, strength, and decrease pain and fascial restrictions in order to return to prior level of independence wtih daily activites. Treatment plan:  MFR and manual stretching in supine.  AAROM and AROM in supine and seated, proximal shoulder strengthening in supine and seated, theraband ext, ret, row, IR/ER, progres as tolerated.     Goals Short Term Goals Time to Complete Short Term Goals: 3 weeks Short Term Goal 1: Patient will be educated on a HEP. Short Term Goal 2: Patient will improve AROM by 5 degrees for in her left shoulder for increased ability to reach overhead at work.  Short Term Goal 3: Patient  will improve scapular stability from good - to good+ for improved posture and shoulder alignment. Short Term Goal 4: Patient will decrase pain in her left shoulder to 5/10 while completing work activities.  Short Term Goal 5: Patient will decrease left shoulder fascial restrictions to min-mod. Long Term Goals Time to Complete Long Term Goals: 6 weeks Long Term Goal 1: Patient will return to prior level of independence with all B/IADLs, work, and leisure activities.  Long Term Goal 2: Patient will have WNL left shoulder AROM in order to hang spools at work, Long Term Goal 3: Patient will improve left shoulder strength to 5/5 for increased ability to golf. Long Term Goal 4: Patient will have 2/10 pain or less at work in her left shoulder.  Long Term Goal 5: Patient will have minimal fascial restrictions in her left shoulder region.  Additional Long Term Goals?: Yes Long Term Goal 6: Patient will have good + scapular stability in her left shoulder.   Problem List Patient Active Problem List   Diagnosis Date Noted  . Dilated cbd, acquired 06/22/2013  . Hematuria 04/12/2013    End of Session Activity Tolerance: Patient tolerated treatment well General Behavior During Therapy: [facility] for tasks assessed/performed OT Plan of Care OT Home Exercise Plan: shoulder stretches in standing and theraband for scapular stabilty OT Patient Instructions: scanned Consulted and Agree with Plan of Care: Patient  GO    Vangie Bicker, OTR/L  08/20/2013, 11:14 AM  Physician Documentation Your signature is required to indicate approval of the treatment plan as stated above.  Please sign and either send electronically or make a copy of this report for your  files and return this physician signed original.  Please mark one 1.__approve of plan  2. ___approve of plan with the following conditions.   ______________________________                                                           _____________________ Physician Signature                                                                                                             Date

## 2013-08-27 ENCOUNTER — Ambulatory Visit (HOSPITAL_COMMUNITY)
Admission: RE | Admit: 2013-08-27 | Discharge: 2013-08-27 | Disposition: A | Discharge status: Home / Self Care | Payer: Managed Care, Other (non HMO) | Source: Ambulatory Visit | Attending: Orthopaedic Surgery | Admitting: Orthopaedic Surgery

## 2013-08-27 NOTE — Progress Notes (Signed)
Occupational Therapy Treatment Patient Details  Name: Yvette Joyce MRN: [id number] Date of Birth: 1961-04-20  Today's Date: 08/27/2013 Time: 6295-2841 OT Time Calculation (min): 49 min Manual therapy 845-906 21' Therapeutic exercises [phone] 45' Visit#: 2 of 6  Re-eval: 09/17/13    Subjective  S:  [facility].  I have been doing my exercises, one is difficult to do. Pain Assessment Currently in Pain?: No/denies Pain Score: 0-No pain  Precautions/Restrictions   progress as tolerated  Exercise/Treatments Supine Protraction: PROM;AAROM;10 reps Horizontal ABduction: PROM;AAROM;10 reps External Rotation: PROM;AAROM;10 reps Internal Rotation: PROM;AAROM;10 reps Flexion: PROM;AAROM;10 reps ABduction: PROM;AAROM;10 reps Pulleys Flexion: 1 minute ABduction: 1 minute Therapy Ball Flexion: 20 reps ABduction: 20 reps Right/Left: 5 reps ROM / Strengthening / Isometric Strengthening Proximal Shoulder Strengthening, Supine: 10X each, no rest break Ball on Wall: 1 min with arm flexed to 90      Manual Therapy Manual Therapy: Myofascial release Myofascial Release: MFR and manual stretching to left upper arm, scapular, and shoulder region to decrease pain and improve pain free mobiilty.    Occupational Therapy Assessment and Plan OT Assessment and Plan Clinical Impression Statement: A:  Patient having difficulty with wall stretches, transitioned to towel slides with increased stretch and less pain.  OT Plan: P:  Attempt AROM in supine and standing at wall.     Goals Short Term Goals Time to Complete Short Term Goals: 3 weeks Short Term Goal 1: Patient will be educated on a HEP. Short Term Goal 1 Progress: Progressing toward goal Short Term Goal 2: Patient will improve AROM by 5 degrees for in her left shoulder for increased ability to reach overhead at work.  Short Term Goal 2 Progress: Progressing toward goal Short Term Goal 3: Patient will improve scapular  stability from good - to good+ for improved posture and shoulder alignment. Short Term Goal 3 Progress: Progressing toward goal Short Term Goal 4: Patient will decrase pain in her left shoulder to 5/10 while completing work activities.  Short Term Goal 4 Progress: Progressing toward goal Short Term Goal 5: Patient will decrease left shoulder fascial restrictions to min-mod. Short Term Goal 5 Progress: Progressing toward goal Long Term Goals Time to Complete Long Term Goals: 6 weeks Long Term Goal 1: Patient will return to prior level of independence with all B/IADLs, work, and leisure activities.  Long Term Goal 1 Progress: Progressing toward goal Long Term Goal 2: Patient will have WNL left shoulder AROM in order to hang spools at work, Long Term Goal 2 Progress: Progressing toward goal Long Term Goal 3: Patient will improve left shoulder strength to 5/5 for increased ability to golf. Long Term Goal 3 Progress: Progressing toward goal Long Term Goal 4: Patient will have 2/10 pain or less at work in her left shoulder.  Long Term Goal 4 Progress: Progressing toward goal Long Term Goal 5: Patient will have minimal fascial restrictions in her left shoulder region.  Long Term Goal 5 Progress: Progressing toward goal Additional Long Term Goals?: Yes Long Term Goal 6: Patient will have good + scapular stability in her left shoulder.  Long Term Goal 6 Progress: Progressing toward goal  Problem List Patient Active Problem List   Diagnosis Date Noted  . Dilated cbd, acquired 06/22/2013  . Hematuria 04/12/2013    End of Session Activity Tolerance: Patient tolerated treatment well General Behavior During Therapy: WFL for tasks assessed/performed OT Plan of Care OT Home Exercise Plan: added towel slides for flexion and  abduction vs wall stretches given at initial evaluation.  Patient noted increased pain and difficulty with wall flexion stretch, when completing ball stretch she had better  stretch and less pain .  [place], OTR/L  08/27/2013, 9:38 AM

## 2013-09-03 ENCOUNTER — Ambulatory Visit
Admission: RE | Admit: 2013-09-03 | Discharge: 2013-09-03 | Disposition: A | Discharge status: Home / Self Care | Payer: Self-pay | Source: Ambulatory Visit

## 2013-09-03 ENCOUNTER — Ambulatory Visit (HOSPITAL_COMMUNITY): Payer: Managed Care, Other (non HMO) | Admitting: Occupational Therapy

## 2013-09-03 ENCOUNTER — Telehealth (HOSPITAL_COMMUNITY): Payer: Self-pay

## 2013-09-03 DIAGNOSIS — Z1231 Encounter for screening mammogram for malignant neoplasm of breast: Secondary | ICD-10-CM

## 2013-09-04 ENCOUNTER — Other Ambulatory Visit: Payer: Self-pay | Admitting: Obstetrics and Gynecology

## 2013-09-04 DIAGNOSIS — R928 Other abnormal and inconclusive findings on diagnostic imaging of breast: Secondary | ICD-10-CM

## 2013-09-07 ENCOUNTER — Ambulatory Visit
Admission: RE | Admit: 2013-09-07 | Discharge: 2013-09-07 | Disposition: A | Discharge status: Home / Self Care | Payer: Managed Care, Other (non HMO) | Source: Ambulatory Visit | Attending: Obstetrics and Gynecology | Admitting: Obstetrics and Gynecology

## 2013-09-07 ENCOUNTER — Telehealth (HOSPITAL_COMMUNITY): Payer: Self-pay

## 2013-09-07 ENCOUNTER — Other Ambulatory Visit: Payer: Self-pay | Admitting: Obstetrics and Gynecology

## 2013-09-07 DIAGNOSIS — R928 Other abnormal and inconclusive findings on diagnostic imaging of breast: Secondary | ICD-10-CM

## 2013-09-07 DIAGNOSIS — N6310 Unspecified lump in the right breast, unspecified quadrant: Secondary | ICD-10-CM

## 2013-09-10 ENCOUNTER — Ambulatory Visit (HOSPITAL_COMMUNITY): Payer: Managed Care, Other (non HMO) | Admitting: Specialist

## 2013-10-19 ENCOUNTER — Other Ambulatory Visit (HOSPITAL_COMMUNITY): Payer: Self-pay | Admitting: Orthopaedic Surgery

## 2013-10-19 DIAGNOSIS — M25562 Pain in left knee: Secondary | ICD-10-CM

## 2013-10-25 ENCOUNTER — Ambulatory Visit (HOSPITAL_COMMUNITY)
Admission: RE | Admit: 2013-10-25 | Discharge: 2013-10-25 | Disposition: A | Discharge status: Home / Self Care | Payer: Managed Care, Other (non HMO) | Source: Ambulatory Visit | Attending: Orthopaedic Surgery | Admitting: Orthopaedic Surgery

## 2013-10-25 ENCOUNTER — Encounter (HOSPITAL_COMMUNITY): Payer: Self-pay

## 2013-10-25 DIAGNOSIS — M25569 Pain in unspecified knee: Secondary | ICD-10-CM | POA: Insufficient documentation

## 2013-10-25 DIAGNOSIS — M25469 Effusion, unspecified knee: Secondary | ICD-10-CM | POA: Insufficient documentation

## 2013-10-25 DIAGNOSIS — M7120 Synovial cyst of popliteal space [Baker], unspecified knee: Secondary | ICD-10-CM | POA: Insufficient documentation

## 2013-10-25 DIAGNOSIS — M25562 Pain in left knee: Secondary | ICD-10-CM

## 2013-10-25 DIAGNOSIS — IMO0002 Reserved for concepts with insufficient information to code with codable children: Secondary | ICD-10-CM | POA: Insufficient documentation

## 2013-10-25 DIAGNOSIS — M1710 Unilateral primary osteoarthritis, unspecified knee: Secondary | ICD-10-CM | POA: Insufficient documentation

## 2014-01-08 ENCOUNTER — Other Ambulatory Visit: Payer: Self-pay | Admitting: Adult Health

## 2014-01-28 ENCOUNTER — Other Ambulatory Visit: Payer: Self-pay | Admitting: Obstetrics and Gynecology

## 2014-01-28 DIAGNOSIS — N6009 Solitary cyst of unspecified breast: Secondary | ICD-10-CM

## 2014-02-13 ENCOUNTER — Encounter (HOSPITAL_COMMUNITY): Payer: Self-pay | Admitting: Emergency Medicine

## 2014-02-13 ENCOUNTER — Emergency Department (HOSPITAL_COMMUNITY)
Admission: EM | Admit: 2014-02-13 | Discharge: 2014-02-13 | Disposition: A | Discharge status: Home / Self Care | Payer: Managed Care, Other (non HMO) | Attending: Emergency Medicine | Admitting: Emergency Medicine

## 2014-02-13 DIAGNOSIS — S99929A Unspecified injury of unspecified foot, initial encounter: Principal | ICD-10-CM

## 2014-02-13 DIAGNOSIS — Z79899 Other long term (current) drug therapy: Secondary | ICD-10-CM | POA: Insufficient documentation

## 2014-02-13 DIAGNOSIS — S99919A Unspecified injury of unspecified ankle, initial encounter: Principal | ICD-10-CM

## 2014-02-13 DIAGNOSIS — M25562 Pain in left knee: Secondary | ICD-10-CM

## 2014-02-13 DIAGNOSIS — Z87448 Personal history of other diseases of urinary system: Secondary | ICD-10-CM | POA: Insufficient documentation

## 2014-02-13 DIAGNOSIS — K5900 Constipation, unspecified: Secondary | ICD-10-CM | POA: Insufficient documentation

## 2014-02-13 DIAGNOSIS — X500XXA Overexertion from strenuous movement or load, initial encounter: Secondary | ICD-10-CM | POA: Insufficient documentation

## 2014-02-13 DIAGNOSIS — Y9302 Activity, running: Secondary | ICD-10-CM | POA: Insufficient documentation

## 2014-02-13 DIAGNOSIS — E039 Hypothyroidism, unspecified: Secondary | ICD-10-CM | POA: Insufficient documentation

## 2014-02-13 DIAGNOSIS — S8990XA Unspecified injury of unspecified lower leg, initial encounter: Secondary | ICD-10-CM | POA: Insufficient documentation

## 2014-02-13 DIAGNOSIS — Y929 Unspecified place or not applicable: Secondary | ICD-10-CM | POA: Insufficient documentation

## 2014-02-13 MED ORDER — TRAMADOL HCL 50 MG PO TABS: 50.0000 mg | ORAL_TABLET | Freq: Four times a day (QID) | ORAL | Status: DC | PRN

## 2014-02-13 MED ORDER — TRAMADOL HCL 50 MG PO TABS
50.0000 mg | ORAL_TABLET | Freq: Once | ORAL | Status: AC
  Administered 2014-02-13: 50 mg via ORAL
  Filled 2014-02-13: qty 1

## 2014-02-13 NOTE — ED Notes (Signed)
Pt ambulated to room from triage without difficulty.

## 2014-02-13 NOTE — Discharge Instructions (Signed)
Knee Pain Knee pain can be a result of an injury or other medical conditions. Treatment will depend on the cause of your pain. HOME CARE  Only take medicine as told by your doctor.  Keep a healthy weight. Being overweight can make the knee hurt more.  Stretch before exercising or playing sports.  If there is constant knee pain, change the way you exercise. Ask your doctor for advice.  Make sure shoes fit well. Choose the right shoe for the sport or activity.  Protect your knees. Wear kneepads if needed.  Rest when you are tired. GET HELP RIGHT AWAY IF:   Your knee pain does not stop.  Your knee pain does not get better.  Your knee joint feels hot to the touch.  You have a fever. MAKE SURE YOU:   Understand these instructions.  Will watch this condition.  Will get help right away if you are not doing well or get worse. Document Released: 10/29/2008 Document Revised: 10/25/2011 Document Reviewed: 10/29/2008 [facility] Patient Information 2015 [place], [place]. This information is not intended to replace advice given to you by your health care provider. Make sure you discuss any questions you have with your health care provider.

## 2014-02-13 NOTE — ED Notes (Signed)
Patient c/o left knee pain. Per patient has been seen in past Dr Durward Fortes for pain in knee and had MRI showing arthritis. Per patient running across yard this morning and heard and felt a pop. Per patient pain progressively worse.

## 2014-02-13 NOTE — ED Notes (Signed)
NAD noted at time of d/c inst

## 2014-02-13 NOTE — ED Notes (Signed)
Pt not in room at this time. No pt belongings in room.

## 2014-02-13 NOTE — ED Provider Notes (Signed)
CSN: [id number]     Arrival date & time 02/13/14  1112 History   First MD Initiated Contact with Patient 02/13/14 1133     Chief Complaint  Patient presents with  . Knee Pain     (Consider location/radiation/quality/duration/timing/severity/associated sxs/prior Treatment) Patient is a 53 y.o. female presenting with knee pain. The history is provided by the patient.  Knee Pain Location:  Knee Time since incident: just PTA. Injury: yes   Mechanism of injury comment:  Twisting injury, denies fall Knee location:  L knee Pain details:    Quality:  Aching and throbbing   Radiates to:  Does not radiate   Severity:  Moderate   Onset quality:  Sudden   Timing:  Constant   Progression:  Worsening Chronicity:  Chronic Dislocation: no   Prior injury to area:  Yes Relieved by:  Nothing Worsened by:  Bearing weight Ineffective treatments:  None tried Associated symptoms: no back pain, no decreased ROM, no fever, no neck pain, no numbness, no stiffness and no swelling    Patient with hx of chronic left knee pain c/o worsening pain to her left knee after a twisting injury just PTA.  She states that her mother became ill this morning and she was running across the yard to meet the ambulance when she "twisted" her knee.  She c/o increased pain since that time.  She denies swelling, numbness or weakness, or fall.  Patient also states that she had a MRI of the knee a few months ago that shows significant arthritis and her orthopedic doctor gave her a "shot in my knee" a few weeks ago.    Past Medical History  Diagnosis Date  . Hypothyroidism   . Headache(784.0)   . Constipation   . Complication of anesthesia     "hard to wake up"  . Hematuria 04/12/2013   Past Surgical History  Procedure Laterality Date  . Fibroids removed from uterus    . Cosmetic eye surgery    . Colonoscopy  09/24/2011    Procedure: COLONOSCOPY;  Surgeon: Dorothyann Peng, MD;  [facility];  Service: Endoscopy;   Laterality: N/A;  9:30 AM  . Nasal septum surgery    . Polypectomy  12/03/2011    Procedure: POLYPECTOMY;  Surgeon: Jonnie Kind, MD;  Location: [facility];  Service: Gynecology;  Laterality: N/A;  resection of polyp  . Hysteroscopy w/d&c  12/03/2011    Procedure: DILATATION AND CURETTAGE /HYSTEROSCOPY;  Surgeon: Jonnie Kind, MD;  Location: [facility];  Service: Gynecology;  Laterality: N/A;  . Laparoscopic supracervical hysterectomy  07/11/2012    Procedure: LAPAROSCOPIC SUPRACERVICAL HYSTERECTOMY;  Surgeon: Jonnie Kind, MD;  Location: [facility];  Service: Gynecology;  Laterality: N/A;  . Laparoscopic salpingo oopherectomy  07/11/2012    Procedure: LAPAROSCOPIC SALPINGO OOPHORECTOMY;  Surgeon: Jonnie Kind, MD;  Location: [facility];  Service: Gynecology;  Laterality: Bilateral;  . Abdominal hysterectomy     Family History  Problem Relation Age of Onset  . Colon cancer Maternal Uncle   . Cancer Sister 54    breast    History  Substance Use Topics  . Smoking status: Never Smoker   . Smokeless tobacco: Never Used  . Alcohol Use: 0.6 oz/week    1 Shots of liquor per week     Comment: 1-2 times a week   OB History   Grav Para Term Preterm Abortions TAB SAB Ect Mult Living  0     Review of Systems  Constitutional: Negative for fever and chills.  Genitourinary: Negative for dysuria and difficulty urinating.  Musculoskeletal: Positive for arthralgias. Negative for back pain, joint swelling, neck pain and stiffness.  Skin: Negative for color change and wound.  All other systems reviewed and are negative.     Allergies  Review of patient's allergies indicates no known allergies.  Home Medications   Prior to Admission medications   Medication Sig Start Date End Date Taking? Authorizing Provider  Bimatoprost (LATISSE EX) Apply 1 application topically daily.   Yes Historical Provider, MD  Cholecalciferol (VITAMIN D PO) Take 1 tablet by mouth daily. Unsure of strength    Yes Historical Provider, MD  CYANOCOBALAMIN PO Take 1 tablet by mouth daily. Unsure of strength   Yes Historical Provider, MD  fish oil-omega-3 fatty acids 1000 MG capsule Take 2 g by mouth daily.   Yes Historical Provider, MD  ibuprofen (ADVIL,MOTRIN) 200 MG tablet Take 400 mg by mouth every 6 (six) hours as needed. For pain   Yes Historical Provider, MD  levothyroxine (SYNTHROID, LEVOTHROID) 75 MCG tablet TAKE 1 TABLET BY MOUTH ONCE DAILY FOR THYROID. 01/08/14  Yes Estill Dooms, NP  Multiple Vitamin (MULITIVITAMIN WITH MINERALS) TABS Take 1 tablet by mouth daily.   Yes Historical Provider, MD  polyethylene glycol powder (GLYCOLAX/MIRALAX) powder Take 17 g by mouth daily. 07/12/12  Yes Jonnie Kind, MD  Specialty Vitamins Products (MAGNESIUM, AMINO ACID CHELATE,) 133 MG tablet Take 1 tablet by mouth daily.   Yes Historical Provider, MD   BP 138/75  Pulse 86  Temp(Src) 98.4 F (36.9 C) (Oral)  Resp 18  Ht 5\' 7"  (1.702 m)  Wt 148 lb (67.132 kg)  BMI 23.17 kg/m2  SpO2 100%  LMP 11/25/2011 Physical Exam  Nursing note and vitals reviewed. Constitutional: She is oriented to person, place, and time. She appears well-developed and well-nourished. No distress.  HENT:  Head: Normocephalic and atraumatic.  Cardiovascular: Normal rate, regular rhythm, normal heart sounds and intact distal pulses.   No murmur heard. Pulmonary/Chest: Effort normal and breath sounds normal. No respiratory distress.  Musculoskeletal: Normal range of motion. She exhibits tenderness. She exhibits no edema.  ttp of the anterior left knee.  No erythema, effusion, or step-off deformity.  DP pulse brisk, distal sensation intact. Calf is soft and NT.  Compartments of the left leg are soft  Neurological: She is alert and oriented to person, place, and time. She exhibits normal muscle tone. Coordination normal.  Skin: Skin is warm and dry. No erythema.    ED Course  Procedures (including critical care time) Labs  Review Labs Reviewed - No data to display  Imaging Review No results found.   EKG Interpretation None      MDM   Final diagnoses:  Knee pain, left anterior    Previous MRI of the knee was reviewed by me showing severe OA.  Pt also reviewed on the [place] narcotic database.  Tramadol prescribed 02/15  Patient able to bear weight and ambulated to the restroom.  No bony deformity of the knee and pt has full ROM.  She agrees to ice, knee immobilizer and close f/u with her orthopedic physician for recheck.  Rx fo ultram .  She appears stable for d/c and agrees to plan.    Jihad Brownlow L. Vanessa Selma, PA-C 02/14/14 [phone]

## 2014-02-19 NOTE — ED Provider Notes (Signed)
Medical screening examination/treatment/procedure(s) were performed by non-physician practitioner and as supervising physician I was immediately available for consultation/collaboration.   EKG Interpretation None      Rolland Porter, MD, Abram Sander   Janice Norrie, MD 02/19/14 1504

## 2014-02-28 ENCOUNTER — Ambulatory Visit (INDEPENDENT_AMBULATORY_CARE_PROVIDER_SITE_OTHER): Payer: Managed Care, Other (non HMO) | Admitting: Adult Health

## 2014-02-28 ENCOUNTER — Other Ambulatory Visit (HOSPITAL_COMMUNITY)
Admission: RE | Admit: 2014-02-28 | Discharge: 2014-02-28 | Disposition: A | Discharge status: Home / Self Care | Payer: Managed Care, Other (non HMO) | Source: Ambulatory Visit | Attending: Adult Health | Admitting: Adult Health

## 2014-02-28 ENCOUNTER — Encounter: Payer: Self-pay | Admitting: Adult Health

## 2014-02-28 VITALS — BP 130/76 | HR 74 | Ht 65.0 in | Wt 147.5 lb

## 2014-02-28 DIAGNOSIS — E039 Hypothyroidism, unspecified: Secondary | ICD-10-CM | POA: Insufficient documentation

## 2014-02-28 DIAGNOSIS — E038 Other specified hypothyroidism: Secondary | ICD-10-CM

## 2014-02-28 DIAGNOSIS — Z1151 Encounter for screening for human papillomavirus (HPV): Secondary | ICD-10-CM | POA: Insufficient documentation

## 2014-02-28 DIAGNOSIS — Z1212 Encounter for screening for malignant neoplasm of rectum: Secondary | ICD-10-CM

## 2014-02-28 DIAGNOSIS — Z01419 Encounter for gynecological examination (general) (routine) without abnormal findings: Secondary | ICD-10-CM

## 2014-02-28 LAB — COMPREHENSIVE METABOLIC PANEL
ALK PHOS: 112 U/L (ref 39–117)
ALT: 18 U/L (ref 0–35)
AST: 23 U/L (ref 0–37)
Albumin: 3.7 g/dL (ref 3.5–5.2)
BILIRUBIN TOTAL: 0.4 mg/dL (ref 0.2–1.2)
BUN: 8 mg/dL (ref 6–23)
CO2: 29 mEq/L (ref 19–32)
CREATININE: 0.7 mg/dL (ref 0.50–1.10)
Calcium: 9.1 mg/dL (ref 8.4–10.5)
Chloride: 100 mEq/L (ref 96–112)
GLUCOSE: 85 mg/dL (ref 70–99)
Potassium: 3.8 mEq/L (ref 3.5–5.3)
Sodium: 139 mEq/L (ref 135–145)
TOTAL PROTEIN: 6.3 g/dL (ref 6.0–8.3)

## 2014-02-28 LAB — LIPID PANEL
CHOL/HDL RATIO: 2.9 ratio
Cholesterol: 184 mg/dL (ref 0–200)
HDL: 64 mg/dL (ref >39)
LDL Cholesterol: 108 mg/dL — ABNORMAL HIGH (ref 0–99)
Triglycerides: 62 mg/dL (ref <150)
VLDL: 12 mg/dL (ref 0–40)

## 2014-02-28 LAB — HEMOCCULT GUIAC POC 1CARD (OFFICE): Fecal Occult Blood, POC: NEGATIVE

## 2014-02-28 LAB — CBC
HEMATOCRIT: 41.3 % (ref 36.0–46.0)
Hemoglobin: 14.2 g/dL (ref 12.0–15.0)
MCH: 30.5 pg (ref 26.0–34.0)
MCHC: 34.4 g/dL (ref 30.0–36.0)
MCV: 88.6 fL (ref 78.0–100.0)
Platelets: 228 10*3/uL (ref 150–400)
RBC: 4.66 MIL/uL (ref 3.87–5.11)
RDW: 14 % (ref 11.5–15.5)
WBC: 4.1 10*3/uL (ref 4.0–10.5)

## 2014-02-28 NOTE — Progress Notes (Signed)
Patient ID: Yvette Joyce, female   DOB: June 04, 1961, 53 y.o.   MRN: [id number] History of Present Illness: Yvette Joyce is a 53 year old white female, divorced, in for a pap and physical.She is sp supracervical hysterectomy.Is in stable 4 year relationship.   Current Medications, Allergies, Past Medical History, Past Surgical History, Family History and Social History were reviewed in [employer] record.     Review of Systems: Patient denies any headaches, blurred vision, shortness of breath, chest pain, abdominal pain, problems with bowel movements, urination, or intercourse. Has pain left knee, no mood swings, her Mom died last week from stroke.    Physical Exam:BP 130/76  Pulse 74  Ht 5\' 5"  (1.651 m)  Wt 147 lb 8 oz (66.906 kg)  BMI 24.55 kg/m2  LMP 11/25/2011 General:  Well developed, well nourished, no acute distress Skin:  Warm and dry Neck:  Midline trachea, normal thyroid Lungs; Clear to auscultation bilaterally Breast:  No dominant palpable mass, retraction, or nipple discharge Cardiovascular: Regular rate and rhythm Abdomen:  Soft, non tender, no hepatosplenomegaly Pelvic:  External genitalia is normal in appearance.  The vagina is pale in color with loss of moisture and rugae..The cervix is smooth and atrophic, pap with HPV performed.Marland Kitchen  Uterus is absent. No adnexal masses or tenderness noted. Rectal: Good sphincter tone, no polyps, or hemorrhoids felt.  Hemoccult negative. Extremities:  No varicosities noted, has swelling in left knee, sees Dr Durward Fortes in [facility]:  No mood changes, alert and cooperative, seems happy   Impression: Yearly gyn exam Hypothyroidism     Plan:  Check CBC,CMP,TSH and lipids Physical in  1 year Mammogram yearly Colonoscopy 2023 Continue meds

## 2014-02-28 NOTE — Patient Instructions (Signed)
Physical in  1 year mammogram yearly Colonoscopy 2023

## 2014-03-01 ENCOUNTER — Telehealth: Payer: Self-pay | Admitting: Adult Health

## 2014-03-01 LAB — CYTOLOGY - PAP

## 2014-03-01 LAB — TSH: TSH: 3.057 u[IU]/mL (ref 0.350–4.500)

## 2014-03-01 NOTE — Telephone Encounter (Signed)
Left message labs good, will mail her a copy

## 2014-03-08 ENCOUNTER — Ambulatory Visit
Admission: RE | Admit: 2014-03-08 | Discharge: 2014-03-08 | Disposition: A | Discharge status: Home / Self Care | Payer: Managed Care, Other (non HMO) | Source: Ambulatory Visit | Attending: Obstetrics and Gynecology | Admitting: Obstetrics and Gynecology

## 2014-03-08 DIAGNOSIS — N6009 Solitary cyst of unspecified breast: Secondary | ICD-10-CM

## 2014-05-01 ENCOUNTER — Ambulatory Visit (HOSPITAL_COMMUNITY)
Admission: RE | Admit: 2014-05-01 | Discharge: 2014-05-01 | Disposition: A | Discharge status: Home / Self Care | Payer: Managed Care, Other (non HMO) | Source: Ambulatory Visit | Attending: Orthopaedic Surgery | Admitting: Orthopaedic Surgery

## 2014-05-01 DIAGNOSIS — IMO0001 Reserved for inherently not codable concepts without codable children: Secondary | ICD-10-CM | POA: Diagnosis present

## 2014-05-01 DIAGNOSIS — M25569 Pain in unspecified knee: Secondary | ICD-10-CM | POA: Diagnosis not present

## 2014-05-01 DIAGNOSIS — M6281 Muscle weakness (generalized): Secondary | ICD-10-CM | POA: Insufficient documentation

## 2014-05-01 DIAGNOSIS — R269 Unspecified abnormalities of gait and mobility: Secondary | ICD-10-CM | POA: Diagnosis not present

## 2014-05-01 DIAGNOSIS — M25562 Pain in left knee: Secondary | ICD-10-CM

## 2014-05-01 DIAGNOSIS — R29898 Other symptoms and signs involving the musculoskeletal system: Secondary | ICD-10-CM

## 2014-05-01 NOTE — Evaluation (Signed)
Physical Therapy Evaluation  Patient Details  Name: Yvette Joyce MRN: [id number] Date of Birth: 06-05-1961  Today's Date: 05/01/2014 Time: 0850-0930 PT Time Calculation (min): 40 min Charge:  Evaluation              Visit#: 1 of 12  Re-eval:   Assessment Diagnosis: Lt arthroscopic surgery  Surgical Date: 03/21/14 Next MD Visit: 05/15/2014 Prior Therapy: none   Authorization: [employer]        Past Medical History:  Past Medical History  Diagnosis Date  . Hypothyroidism   . Headache(784.0)   . Constipation   . Complication of anesthesia     "hard to wake up"  . Hematuria 04/12/2013   Past Surgical History:  Past Surgical History  Procedure Laterality Date  . Fibroids removed from uterus    . Cosmetic eye surgery    . Colonoscopy  09/24/2011    Procedure: COLONOSCOPY;  Surgeon: Dorothyann Peng, MD;  [facility];  Service: Endoscopy;  Laterality: N/A;  9:30 AM  . Nasal septum surgery    . Polypectomy  12/03/2011    Procedure: POLYPECTOMY;  Surgeon: Jonnie Kind, MD;  Location: [facility];  Service: Gynecology;  Laterality: N/A;  resection of polyp  . Hysteroscopy w/d&c  12/03/2011    Procedure: DILATATION AND CURETTAGE /HYSTEROSCOPY;  Surgeon: Jonnie Kind, MD;  Location: [facility];  Service: Gynecology;  Laterality: N/A;  . Laparoscopic supracervical hysterectomy  07/11/2012    Procedure: LAPAROSCOPIC SUPRACERVICAL HYSTERECTOMY;  Surgeon: Jonnie Kind, MD;  Location: [facility];  Service: Gynecology;  Laterality: N/A;  . Laparoscopic salpingo oopherectomy  07/11/2012    Procedure: LAPAROSCOPIC SALPINGO OOPHORECTOMY;  Surgeon: Jonnie Kind, MD;  Location: [facility];  Service: Gynecology;  Laterality: Bilateral;  . Abdominal hysterectomy      Subjective Symptoms/Limitations Symptoms: [name] states that she had arthroscopic surgery on her Lt knee on 03/21/2014.  She states that she is better since the surgery but has not returned to normal therefore she is being referred  to therapy.  How long can you sit comfortably?: 10 to 15 minutes  How long can you stand comfortably?: standing is okay How long can you walk comfortably?: able to walk for a long time.  Special Tests: Pt is having difficulty squatting and needs to squat down every 10 minutes at work.  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  Leisure: Hobbies-yes (Comment) Comments: golf      Sensation/Coordination/Flexibility/Functional Tests Functional Tests Functional Tests: foto 38  Assessment LLE AROM (degrees) Left Knee Extension: 5 Left Knee Flexion: 140 LLE Strength Left Hip Flexion: 5/5 Left Hip Extension: 3/5 Left Hip ABduction: 5/5 Left Knee Flexion: 3+/5 Left Knee Extension: 5/5 Left Ankle Dorsiflexion: 5/5 Left Ankle Plantar Flexion: 5/5  Exercise/Treatments Mobility/Balance  Static Standing Balance Single Leg Stance - Right Leg: 60 Single Leg Stance - Left Leg: 22    Standing Lateral Step Up: 10 reps Seated Long Arc Quad: 10 reps;Limitations;Weights Long Arc  Quad Weight: 3 lbs.   Prone  Hamstring Curl: 10 reps;Other (comment) (3#) Hip Extension: 10 reps     Physical Therapy Assessment and Plan PT Assessment and Plan Clinical Impression Statement: Pt is a 53 yo female who has had Lt arthroscopic surgery on her knee on 03/21/2014.  The patient states that she is still not able to return to work secondary to work activity requiring pt to squat once every ten minutes  therefore she has been referred to therapy .  Exam demonstrates decreased hamstring and glut max strength as well as decreased balance.  The patient will benefit from skilled therapy to return her to work status.  Pt will benefit from skilled therapeutic intervention in order to improve on the following deficits: Decreased mobility;Decreased activity tolerance;Pain;Decreased balance Rehab Potential: Good PT Frequency: Min 3X/week PT Duration: 4 weeks PT Treatment/Interventions: Therapeutic activities;Therapeutic exercise;Balance training;Gait training PT Plan: Pt will need higher level activity; wall squats , lunges, forward stop ups, as well as progression with balance     Goals Home Exercise Program Pt/caregiver will Perform Home Exercise Program: For increased strengthening PT Short Term Goals Time to Complete Short Term Goals: 2 weeks PT Short Term Goal 1: Pt pain to be no greater than a 2/10 PT Short Term Goal 2: Pt to be able to squat down without increased pain PT Short Term Goal 3: Pt to be able to sit for 30 minutes to enjoy a meal  PT Long Term Goals Time to Complete Long Term Goals: 4 weeks PT Long Term Goal 1: Pt I in advance HEP  PT Long Term Goal 2: Pt to be able to RTW   Problem List Patient Active Problem List   Diagnosis Date Noted  . Knee pain, acute 05/01/2014  . Left leg weakness 05/01/2014  . Hypothyroid 02/28/2014  . Dilated cbd, acquired 06/22/2013  . Hematuria 04/12/2013    PT Plan of Care PT Home Exercise Plan: given   GP    Charish Schroepfer,[name] 05/01/2014, 12:52 PM  Physician Documentation Your signature is required to indicate approval of the treatment plan as stated above.  Please sign and either send electronically or make a copy of this report for your files and return this physician signed original.   Please mark one 1.__approve of plan  2. ___approve of plan with the following conditions.   ______________________________                                                           _____________________ Physician Signature                                                                                                             Date

## 2014-05-02 ENCOUNTER — Ambulatory Visit (HOSPITAL_COMMUNITY)
Admission: RE | Admit: 2014-05-02 | Discharge: 2014-05-02 | Disposition: A | Discharge status: Home / Self Care | Payer: Managed Care, Other (non HMO) | Source: Ambulatory Visit | Attending: Internal Medicine | Admitting: Internal Medicine

## 2014-05-02 DIAGNOSIS — IMO0001 Reserved for inherently not codable concepts without codable children: Secondary | ICD-10-CM | POA: Diagnosis not present

## 2014-05-02 NOTE — Progress Notes (Signed)
Physical Therapy Treatment Patient Details  Name: Yvette Joyce MRN: [id number] Date of Birth: [birth year]  Today's Date: 05/02/2014 Time: 0902-0929 PT Time Calculation (min): 27 min TE 0902-0929  Visit#: 2 of 12  Re-eval: 05/29/14 Assessment Diagnosis: Lt arthroscopic surgery  Surgical Date: 03/21/14 Next MD Visit: 05/15/2014    Subjective: Symptoms/Limitations Symptoms: Pt reports some pain into her back today from her evaluation, and had to take 1/2 an oxycodone.  Pain in back currenlty 2/10, and pain in Lt knee 4/10.  She reports she has been doing some bike riding and walking some, though she is not back to her normal self.  Pain Assessment Pain Score: 4  Pain Location: Knee Pain Orientation: Left;Medial Pain Type: Surgical pain   Exercise/Treatments Stretches Active Hamstring Stretch: 3 reps;30 seconds;Limitations Active Hamstring Stretch Limitations: 14" Box Quad Stretch: 2 reps;20 seconds Gastroc Stretch: 3 reps;30 seconds;Limitations Gastroc Stretch Limitations: Slantboard Seated Long Arc Quad: 15 reps Long Arc Quad Weight: 3 lbs. Supine Straight Leg Raises: 15 reps;Left Sidelying Hip ABduction: 15 reps;Left Prone  Hamstring Curl: 15 reps;Limitations Hamstring Curl Limitations: 3# Hip Extension: 15 reps;Left      Physical Therapy Assessment and Plan PT Assessment and Plan Clinical Impression Statement: Pt arrived 15 minutes late to treatment session.  Initated therapeutic exercise program per PT POC.  Noted increased c/o stretching sensation with gastoc/HS stretch as pt has a slight limitation in knee extension seen during gait.  Pt was able to complete quad stretch withoug pain and only a slight stretching. Educated pt to continue HEP at home and will progress program as tolerated.  Pt will benefit from skilled therapeutic intervention in order to improve on the following deficits: Decreased mobility;Decreased activity tolerance;Pain;Decreased balance PT  Plan: Pt will need higher level activity; wall squats , lunges, forward stop ups, as well as progression with balance      Problem List Patient Active Problem List   Diagnosis Date Noted  . Knee pain, acute 05/01/2014  . Left leg weakness 05/01/2014  . Hypothyroid 02/28/2014  . Dilated cbd, acquired 06/22/2013  . Hematuria 04/12/2013    PT - End of Session Activity Tolerance: Patient tolerated treatment well   Lyberti Thrush 05/02/2014, 9:31 AM

## 2014-05-06 ENCOUNTER — Ambulatory Visit (HOSPITAL_COMMUNITY)
Admission: RE | Admit: 2014-05-06 | Discharge: 2014-05-06 | Disposition: A | Discharge status: Home / Self Care | Payer: Managed Care, Other (non HMO) | Source: Ambulatory Visit | Attending: Physical Therapy | Admitting: Physical Therapy

## 2014-05-06 DIAGNOSIS — IMO0001 Reserved for inherently not codable concepts without codable children: Secondary | ICD-10-CM | POA: Diagnosis not present

## 2014-05-06 NOTE — Progress Notes (Signed)
Physical Therapy Treatment Patient Details  Name: Yvette Joyce MRN: [id number] Date of Birth: 08-15-1961  Today's Date: 05/06/2014 Time: 1022-1102 PT Time Calculation (min): 40 min TE [id number]  Visit#: 3 of 12  Re-eval: 05/29/14 Assessment Diagnosis: Lt arthroscopic surgery  Surgical Date: 03/21/14 Next MD Visit: 05/15/2014    Subjective: Symptoms/Limitations Symptoms: Pt reports moderate-severe levels of pain in medial knee today Pain Assessment Currently in Pain?: Yes Pain Score: 8  Pain Location: Knee Pain Orientation: Left;Medial Pain Type: Surgical pain   Exercise/Treatments Stretches Active Hamstring Stretch: 3 reps;30 seconds;Limitations Active Hamstring Stretch Limitations: 14" Box Gastroc Stretch: 3 reps;30 seconds;Limitations Gastroc Stretch Limitations: Slantboard Standing Lateral Step Up: 10 reps;Step Height: 8" Forward Step Up: 10 reps;Step Height: 8" Functional Squat: 10 reps SLS: Rt 60", Lt 60" Seated Long Arc Quad: 2 sets;10 reps Long Arc Quad Weight: 3 lbs. Supine Straight Leg Raises: 2 sets;10 reps Sidelying Hip ABduction: 2 sets;10 reps Prone  Hamstring Curl: 2 sets;10 reps Hamstring Curl Limitations: 3# Hip Extension: 2 sets;10 reps;Left      Physical Therapy Assessment and Plan PT Assessment and Plan Clinical Impression Statement: Progressed exercise proram, with increasing reps this session to tolerance as pt demonstrates improving strength.  Pt reprots noticable improvements from last session in regards to ROM (improved extension during gait).   Some extension limations continue, with mild extensor lag noted in SLR flexion though pt is able to maintain a good quad contraction during exercise.  Good balance during assessment today in SLS with balance equal on Rt and Lt side, though some mild reports of pain reported in  medial Lt knee with full WB on that LE by ~30".  Pt will benefit from skilled therapeutic intervention in order to  improve on the following deficits: Decreased mobility;Decreased activity tolerance;Pain;Decreased balance PT Plan: Progress balance onto uneven terrain or with reactive exercises.     Goals PT Short Term Goals PT Short Term Goal 1: Pt pain to be no greater than a 2/10 PT Short Term Goal 1 - Progress: Progressing toward goal PT Short Term Goal 2: Pt to be able to squat down without increased pain PT Short Term Goal 2 - Progress: Progressing toward goal  Problem List Patient Active Problem List   Diagnosis Date Noted  . Knee pain, acute 05/01/2014  . Left leg weakness 05/01/2014  . Hypothyroid 02/28/2014  . Dilated cbd, acquired 06/22/2013  . Hematuria 04/12/2013    PT - End of Session Activity Tolerance: Patient tolerated treatment well   Damean Poffenberger 05/06/2014, 11:09 AM

## 2014-05-08 ENCOUNTER — Ambulatory Visit (HOSPITAL_COMMUNITY)
Admission: RE | Admit: 2014-05-08 | Discharge: 2014-05-08 | Disposition: A | Discharge status: Home / Self Care | Payer: Managed Care, Other (non HMO) | Source: Ambulatory Visit | Attending: Orthopaedic Surgery | Admitting: Orthopaedic Surgery

## 2014-05-08 DIAGNOSIS — IMO0001 Reserved for inherently not codable concepts without codable children: Secondary | ICD-10-CM | POA: Diagnosis not present

## 2014-05-08 DIAGNOSIS — R29898 Other symptoms and signs involving the musculoskeletal system: Secondary | ICD-10-CM

## 2014-05-08 DIAGNOSIS — M25562 Pain in left knee: Secondary | ICD-10-CM

## 2014-05-08 NOTE — Progress Notes (Signed)
Physical Therapy Treatment Patient Details  Name: Yvette Joyce MRN: [id number] Date of Birth: Jul 05, 1961  Today's Date: 05/08/2014 Time: 0803-0848 PT Time Calculation (min): 45 min    Charges: TE [id number], gait [phone] Visit#: 4 of 12  Re-eval:   Assessment Diagnosis: Lt arthroscopic surgery  Surgical Date: 03/21/14 Next MD Visit: 05/15/2014  Authorization: [employer]  Authorization Time Period:    Authorization Visit#:   of     Subjective: Symptoms/Limitations Symptoms: Patient states she felt good after last session but noted limping yesterday due pain along medial Lt knee. Patient states her job requires a lot of squatting and has been having pain with it.  Pain Assessment Currently in Pain?: Yes Pain Score: 4  Pain Location: Knee Pain Orientation: Left;Medial  Exercise/Treatments Mobility/Balance  Static Standing Balance Single Leg Stance - Right Leg: 60 Single Leg Stance - Left Leg: 60 Stretches Active Hamstring Stretch: Limitations;4 reps;20 seconds Active Hamstring Stretch Limitations: 14" Box 2 way, neutral and opposite side rotation Quad Stretch: 2 reps;20 seconds;Limitations Quad Stretch Limitations: prone Hip Flexor Stretch: 4 reps;20 seconds;Limitations Hip Flexor Stretch Limitations: hip flexor and groin stretch to 14" box neutral and opposite side rotation Standing Forward Lunges: Both;10 reps;Limitations Forward Lunges Limitations: with knee high reach to increase glut/hamstring activation Side Lunges: 10 reps;Limitations Side Lunges Limitations: 6" Functional Squat: Limitations;5 reps Functional Squat Limitations: squat matrix (35 sqauts with 3lb dumbbell Stairs: 12 steps reciprocal pattern up and down, notes no pain.  Other Standing Knee Exercises: anterior lateral lunge walks 30 ft.   Physical Therapy Assessment and Plan PT Assessment and Plan Clinical Impression Statement: Progressed to more functional exercises this session to improve gait and  improve patient's ability to perform repetitive squatting as needed for patient's job. Patient noted decreased pain at end of session and demosntrated improved gait with decreased scissoring, though patient conitnued to demosntrate limited hip abduction in transition phase 2 of gait resulting in decreased power/stride length.  PT Plan: Add single leg balance reach matrix with common directions to increase knee strength and stability. .     Goals PT Short Term Goals PT Short Term Goal 1: Pt pain to be no greater than a 2/10 PT Short Term Goal 1 - Progress: Progressing toward goal PT Short Term Goal 2: Pt to be able to squat down without increased pain PT Short Term Goal 2 - Progress: Progressing toward goal PT Short Term Goal 3: Pt to be able to sit for 30 minutes to enjoy a meal  PT Short Term Goal 3 - Progress: Progressing toward goal  Problem List Patient Active Problem List   Diagnosis Date Noted  . Knee pain, acute 05/01/2014  . Left leg weakness 05/01/2014  . Hypothyroid 02/28/2014  . Dilated cbd, acquired 06/22/2013  . Hematuria 04/12/2013    PT - End of Session Activity Tolerance: Patient tolerated treatment well  GP    Serene Kopf R 05/08/2014, 9:00 AM

## 2014-05-10 ENCOUNTER — Ambulatory Visit (HOSPITAL_COMMUNITY)
Admission: RE | Admit: 2014-05-10 | Discharge: 2014-05-10 | Disposition: A | Discharge status: Home / Self Care | Payer: Managed Care, Other (non HMO) | Source: Ambulatory Visit | Attending: Orthopaedic Surgery | Admitting: Orthopaedic Surgery

## 2014-05-10 DIAGNOSIS — M25562 Pain in left knee: Secondary | ICD-10-CM

## 2014-05-10 DIAGNOSIS — IMO0001 Reserved for inherently not codable concepts without codable children: Secondary | ICD-10-CM | POA: Diagnosis not present

## 2014-05-10 DIAGNOSIS — R29898 Other symptoms and signs involving the musculoskeletal system: Secondary | ICD-10-CM

## 2014-05-10 NOTE — Progress Notes (Signed)
Physical Therapy Treatment Patient Details  Name: Yvette Joyce MRN: [id number] Date of Birth: 26-Sep-1960  Today's Date: 05/10/2014 Time: 8916-9450 PT Time Calculation (min): 15 min    Charges: TE [id number] Visit#: 5 of 12  Re-eval: 05/29/14 Assessment Diagnosis: Lt arthroscopic surgery  Surgical Date: 03/21/14 Next MD Visit: 05/15/2014  Authorization: Christella Scheuermann  Subjective: Symptoms/Limitations Symptoms: Pain scale 5/10 medial postion of Lt knee, rainy weather increased pain.patient states she has to leave at noon for work. Pain Assessment Currently in Pain?: Yes Pain Score: 5  Pain Location: Knee Pain Orientation: Left;Medial  Exercise/Treatments Stretches ITB Stretch: 3 reps;20 seconds Piriformis Stretch: 3 reps;20 seconds Standing Functional Squat: 10 reps Other Standing Knee Exercises: Single leg balance reach anterior and lateral 10x  Physical Therapy Assessment and Plan PT Assessment and Plan Clinical Impression Statement: Session limited due to patient's time/scheduling constrants, patient demosntrated squatting at the beginning of the session displaying excessive knee external rotaion as depth increased. FOllwoign performance of piriformis and ITBand stretching patient noted decreased pain and improved depth and form with squatting. Reaming time in session spent educatign patient on single leg banlance reach to increase LE strengh/stability for dynamic activities PT Plan: Add single leg balance reach matrix with common directions to increase knee strength and stability. Review lunging techniques.     Problem List Patient Active Problem List   Diagnosis Date Noted  . Knee pain, acute 05/01/2014  . Left leg weakness 05/01/2014  . Hypothyroid 02/28/2014  . Dilated cbd, acquired 06/22/2013  . Hematuria 04/12/2013   GP    San Lohmeyer R 05/10/2014, 12:15 PM

## 2014-05-13 ENCOUNTER — Ambulatory Visit (HOSPITAL_COMMUNITY)
Admission: RE | Admit: 2014-05-13 | Discharge: 2014-05-13 | Disposition: A | Discharge status: Home / Self Care | Payer: Managed Care, Other (non HMO) | Source: Ambulatory Visit | Attending: Orthopaedic Surgery | Admitting: Orthopaedic Surgery

## 2014-05-13 DIAGNOSIS — IMO0001 Reserved for inherently not codable concepts without codable children: Secondary | ICD-10-CM | POA: Diagnosis not present

## 2014-05-13 DIAGNOSIS — M25562 Pain in left knee: Secondary | ICD-10-CM

## 2014-05-13 DIAGNOSIS — R29898 Other symptoms and signs involving the musculoskeletal system: Secondary | ICD-10-CM

## 2014-05-13 NOTE — Progress Notes (Signed)
Physical Therapy Re-evaluation/Progress Note  Patient Details  Name: Yvette Joyce MRN: [id number] Date of Birth: 12-28-1960  Today's Date: 05/13/2014 Time: 1123-1208 PT Time Calculation (min): 27 min ChargteL TE [id number], MMT/ROM 7867-5449, FOTO no charge              Visit#: 6 of 18  Re-eval: 06/10/14 Assessment Diagnosis: Lt arthroscopic surgery  Surgical Date: 03/21/14 Next MD Visit: [place] 05/15/2014 Prior Therapy: none   Authorization: [employer]    Authorization Time Period:    Authorization Visit#:   of     Subjective Symptoms/Limitations Symptoms: Pt stated pain scale 3/10, biggest problems with squats and stairs on medial portion Lt knee.  Pt does feel she is 40% better than day one. Pt states pain may be from weather and reports increase ease with sleep. How long can you sit comfortably?: Able to sit comfortably through movie in theater with leg propped up (was 10 to 15 minutes) How long can you stand comfortably?: standing is okay How long can you walk comfortably?: able to walk for a long time.  Pain Assessment Currently in Pain?: Yes Pain Score: 3  Pain Location: Knee Pain Orientation: Left;Medial  Sensation/Coordination/Flexibility/Functional Tests Functional Tests Functional Tests: foto 60 (foto was 38)  Assessment LLE AROM (degrees) Left Knee Extension: -2 (was 5) Left Knee Flexion: 140 (was 140) LLE Strength Left Hip Flexion: 5/5 Left Hip Extension: 3+/5 (was 3/5) Left Hip ABduction: 5/5 Left Knee Flexion: 4/5 (was 3+/5) Left Knee Extension: 5/5 Left Ankle Dorsiflexion: 5/5 Left Ankle Plantar Flexion: 5/5  Exercise/Treatments Stretches Active Hamstring Stretch: 3 reps;30 seconds;Limitations Active Hamstring Stretch Limitations: 14" box 3 directions Gastroc Stretch: 3 reps;30 seconds;Limitations Gastroc Stretch Limitations: Slantboard Standing Forward Lunges: Both;10 reps;Limitations Forward Lunges Limitations: with knee high reach to increase  glut/hamstring activation Side Lunges: 10 reps;Limitations Side Lunges Limitations: 6" Functional Squat: 10 reps;5 reps;Limitations Functional Squat Limitations: squat reach matrix Other Standing Knee Exercises: Single leg balance reach common    Physical Therapy Assessment and Plan PT Assessment and Plan Clinical Impression Statement: Reassessment complete prior MD apt on 05/16/2011 with the following findings: Pt independent with HEP and able to demonstrate/verbalize appropriate technique with all exercises Improved tolerance for sitting, standing and walking. Strength improving but pt continues to demonstrate weak hamstrings and gluteal musculature affecting gait and stability with prolonged period of time. Pt job includes tasks with lots of squats, pt stated she does not feel ready for RTW. Pt able to demonstrate approraite form with squats with cueing for proper weight loading. Pt will continued to benefits from skilled intervention to address remaining deicits PT Plan: Recommending continuing OPPT for 4 more weeks to address remaining deficits.  Continue with single leg balance reach matrix and progress strength and stabiltiy.      Goals Home Exercise Program PT Goal: Perform Home Exercise Program - Progress: Met PT Short Term Goals PT Short Term Goal 1: Pt pain to be no greater than a 2/10 PT Short Term Goal 1 - Progress: Progressing toward goal PT Short Term Goal 2: Pt to be able to squat down without increased pain PT Short Term Goal 2 - Progress: Progressing toward goal PT Short Term Goal 3: Pt to be able to sit for 30 minutes to enjoy a meal  PT Short Term Goal 3 - Progress: Met PT Long Term Goals PT Long Term Goal 1: Pt I in advance HEP  PT Long Term Goal 1 - Progress: Progressing toward goal PT Long Term Goal  2: Pt to be able to RTW  PT Long Term Goal 2 - Progress: Not met  Problem List Patient Active Problem List   Diagnosis Date Noted  . Knee pain, acute 05/01/2014  .  Left leg weakness 05/01/2014  . Hypothyroid 02/28/2014  . Dilated cbd, acquired 06/22/2013  . Hematuria 04/12/2013    PT - End of Session Activity Tolerance: Patient tolerated treatment well General Behavior During Therapy: [facility] for tasks assessed/performed  GP    Aldona Lento 05/13/2014, 4:32 PM  Physician Documentation Your signature is required to indicate approval of the treatment plan as stated above.  Please sign and either send electronically or make a copy of this report for your files and return this physician signed original.   Please mark one 1.__approve of plan  2. ___approve of plan with the following conditions.   ______________________________                                                          _____________________ Physician Signature                                                                                                             Date

## 2014-05-14 NOTE — Progress Notes (Signed)
Physical Therapy Re-evaluation/Progress Note  Patient Details  Name: Yvette Joyce MRN: [id number] Date of Birth: 09/08/1960  Today's Date: 05/13/2014 Time: 1123-1208 PT Time Calculation (min): 41 min ChargteL TE [id number], MMT/ROM 7588-3254, FOTO no charge              Visit#: 6 of 18  Re-eval: 06/10/14 Assessment Diagnosis: Lt arthroscopic surgery  Surgical Date: 03/21/14 Next MD Visit: [place] 05/15/2014 Prior Therapy: none   Authorization: [employer]     Subjective Symptoms/Limitations Symptoms: Pt stated pain scale 3/10, biggest problems with squats and stairs on medial portion Lt knee.  Pt does feel she is 40% better than day one. Pt states pain may be from weather and reports increase ease with sleep. How long can you sit comfortably?: Able to sit comfortably through movie in theater with leg propped up (was 10 to 15 minutes) How long can you stand comfortably?: standing is okay How long can you walk comfortably?: able to walk for a long time.  Pain Assessment Currently in Pain?: Yes Pain Score: 3  Pain Location: Knee Pain Orientation: Left;Medial  Sensation/Coordination/Flexibility/Functional Tests Functional Tests Functional Tests: foto 60 (foto was 38)  Assessment LLE AROM (degrees) Left Knee Extension: -2 (was 5) Left Knee Flexion: 140 (was 140) LLE Strength Left Hip Flexion: 5/5 Left Hip Extension: 3+/5 (was 3/5) Left Hip ABduction: 5/5 Left Knee Flexion: 4/5 (was 3+/5) Left Knee Extension: 5/5 Left Ankle Dorsiflexion: 5/5 Left Ankle Plantar Flexion: 5/5  Exercise/Treatments Stretches Active Hamstring Stretch: 3 reps;30 seconds;Limitations Active Hamstring Stretch Limitations: 14" box 3 directions Gastroc Stretch: 3 reps;30 seconds;Limitations Gastroc Stretch Limitations: Slantboard Standing Forward Lunges: Both;10 reps;Limitations Forward Lunges Limitations: with knee high reach to increase glut/hamstring activation Side Lunges: 10  reps;Limitations Side Lunges Limitations: 6" Functional Squat: 10 reps;5 reps;Limitations Functional Squat Limitations: squat reach matrix Other Standing Knee Exercises: Single leg balance reach common    Physical Therapy Assessment and Plan PT Assessment and Plan Clinical Impression Statement: Reassessment complete prior MD apt on 05/16/2011 with the following findings: Pt independent with HEP and able to demonstrate/verbalize appropriate technique with all exercises Improved tolerance for sitting, standing and walking. Strength improving but pt continues to demonstrate weak hamstrings and gluteal musculature affecting gait and stability with prolonged period of time. Pt job includes tasks with lots of squats, pt stated she does not feel ready for RTW. Pt able to demonstrate approraite form with squats with cueing for proper weight loading. Pt will continued to benefits from skilled intervention to address remaining deicits PT Plan: Recommending continuing OPPT for 4 more weeks to address remaining deficits.  Continue with single leg balance reach matrix and progress strength and stabiltiy.      Goals Home Exercise Program PT Goal: Perform Home Exercise Program - Progress: Met PT Short Term Goals PT Short Term Goal 1: Pt pain to be no greater than a 2/10 PT Short Term Goal 1 - Progress: Progressing toward goal PT Short Term Goal 2: Pt to be able to squat down without increased pain PT Short Term Goal 2 - Progress: Progressing toward goal PT Short Term Goal 3: Pt to be able to sit for 30 minutes to enjoy a meal  PT Short Term Goal 3 - Progress: Met PT Long Term Goals PT Long Term Goal 1: Pt I in advance HEP  PT Long Term Goal 1 - Progress: Progressing toward goal PT Long Term Goal 2: Pt to be able to RTW  PT Long Term Goal 2 -  Progress: Not met  Problem List Patient Active Problem List   Diagnosis Date Noted  . Knee pain, acute 05/01/2014  . Left leg weakness 05/01/2014  . Hypothyroid  02/28/2014  . Dilated cbd, acquired 06/22/2013  . Hematuria 04/12/2013    PT - End of Session Activity Tolerance: Patient tolerated treatment well General Behavior During Therapy: [facility] for tasks assessed/performed  GP    Aldona Lento 05/13/2014, 4:32 PM  Devona Konig PT DPT  Physician Documentation Your signature is required to indicate approval of the treatment plan as stated above.  Please sign and either send electronically or make a copy of this report for your files and return this physician signed original.   Please mark one 1.__approve of plan  2. ___approve of plan with the following conditions.   ______________________________                                                          _____________________ Physician Signature                                                                                                             Date

## 2014-05-15 ENCOUNTER — Ambulatory Visit (HOSPITAL_COMMUNITY): Payer: Managed Care, Other (non HMO)

## 2014-05-17 ENCOUNTER — Ambulatory Visit (HOSPITAL_COMMUNITY)
Admission: RE | Admit: 2014-05-17 | Discharge: 2014-05-17 | Disposition: A | Discharge status: Home / Self Care | Payer: Managed Care, Other (non HMO) | Source: Ambulatory Visit | Attending: Internal Medicine | Admitting: Internal Medicine

## 2014-05-17 DIAGNOSIS — R2689 Other abnormalities of gait and mobility: Secondary | ICD-10-CM | POA: Diagnosis not present

## 2014-05-17 DIAGNOSIS — M25562 Pain in left knee: Secondary | ICD-10-CM

## 2014-05-17 DIAGNOSIS — Z5189 Encounter for other specified aftercare: Secondary | ICD-10-CM | POA: Insufficient documentation

## 2014-05-17 DIAGNOSIS — R29898 Other symptoms and signs involving the musculoskeletal system: Secondary | ICD-10-CM

## 2014-05-17 DIAGNOSIS — M6281 Muscle weakness (generalized): Secondary | ICD-10-CM | POA: Insufficient documentation

## 2014-05-17 NOTE — Progress Notes (Signed)
Physical Therapy Treatment Patient Details  Name: FABIENNE NOLASCO MRN: [id number] Date of Birth: 1961-06-16  Today's Date: 05/17/2014 Time: 6767-2094 PT Time Calculation (min): 42 min Charge: TE 7096-2836  Visit#: 7 of 18  Re-eval: 06/10/14 Assessment Diagnosis: Lt arthroscopic surgery  Surgical Date: 03/21/14 Next MD Visit: [place] 3 weeks  Authorization: [employer]  Authorization Time Period:    Authorization Visit#:   of     Subjective: Symptoms/Limitations Symptoms: Pt stated MD happy with progress, approved continued rehab for 4 more weeks.  Pain is reducing in morning.  Pt continued to practice squats and feels her balance and form are improving.  Has been walking and focusing on Pain Assessment Currently in Pain?: Yes Pain Score: 3   Objective:   Exercise/Treatments Stretches Active Hamstring Stretch: 3 reps;30 seconds;Limitations Active Hamstring Stretch Limitations: 14" box 3 directions ITB Stretch: 3 reps;20 seconds;Limitations ITB Stretch Limitations: beside 6in box Gastroc Stretch: 3 reps;30 seconds;Limitations Gastroc Stretch Limitations: Slantboard Standing Forward Lunges: Both;10 reps;Limitations Forward Lunges Limitations: 8in step Side Lunges: 10 reps;Limitations Side Lunges Limitations: 6" Functional Squat: 5 reps;Limitations Functional Squat Limitations: squat matrix 7 positions SLS with Vectors: 3x 5" Gait Training: 2D walking matrix Other Standing Knee Exercises: Single leg balance reach common 10x   Physical Therapy Assessment and Plan PT Assessment and Plan Clinical Impression Statement: Pt progressing well towards PT POC, progressed strength with squat matrix 7 directions for gluteal strengthening, pt able to demonstrate appropriate form with all squats today with min cueing for appropriate loading.  Added SLS reaching with noted instability, 1 finger HHA required for stabiltiy.   PT Plan: Continue gluteal strengthening, balance activities and  progres    Goals PT Short Term Goals PT Short Term Goal 1: Pt pain to be no greater than a 2/10 PT Short Term Goal 2: Pt to be able to squat down without increased pain PT Short Term Goal 2 - Progress: Progressing toward goal PT Short Term Goal 3: Pt to be able to sit for 30 minutes to enjoy a meal  PT Long Term Goals PT Long Term Goal 1: Pt I in advance HEP  PT Long Term Goal 2: Pt to be able to RTW   Problem List Patient Active Problem List   Diagnosis Date Noted  . Knee pain, acute 05/01/2014  . Left leg weakness 05/01/2014  . Hypothyroid 02/28/2014  . Dilated cbd, acquired 06/22/2013  . Hematuria 04/12/2013    PT - End of Session Activity Tolerance: Patient tolerated treatment well General Behavior During Therapy: [facility] for tasks assessed/performed  GP    Aldona Lento 05/17/2014, 3:24 PM

## 2014-05-20 ENCOUNTER — Ambulatory Visit (HOSPITAL_COMMUNITY)
Admission: RE | Admit: 2014-05-20 | Discharge: 2014-05-20 | Disposition: A | Discharge status: Home / Self Care | Payer: Managed Care, Other (non HMO) | Source: Ambulatory Visit | Attending: Orthopaedic Surgery | Admitting: Orthopaedic Surgery

## 2014-05-20 DIAGNOSIS — Z5189 Encounter for other specified aftercare: Secondary | ICD-10-CM | POA: Diagnosis not present

## 2014-05-20 NOTE — Progress Notes (Signed)
Physical Therapy Treatment Patient Details  Name: Yvette Joyce MRN: [id number] Date of Birth: 11-09-1960  Today's Date: 05/20/2014 Time: 0803-0850 PT Time Calculation (min): 47 min  Visit#: 8 of 18  Re-eval: 06/10/14 Authorization: [employer]  Charges:  therex 45  Subjective: Symptoms/Limitations Symptoms: Pt states therapy is really helping.  She can really tell a difference in the mornings.  Statess she is compliant with HEP and is currently with medial Lt knee pain. Pain Assessment Currently in Pain?: Yes Pain Score: 3  Pain Location: Knee Pain Orientation: Left;Medial   Exercise/Treatments Stretches Active Hamstring Stretch: 3 reps;30 seconds;Limitations Active Hamstring Stretch Limitations: 14" box ITB Stretch: 2 reps;30 seconds ITB Stretch Limitations: beside 6in box Gastroc Stretch: 3 reps;30 seconds;Limitations Gastroc Stretch Limitations: Slantboard Standing Forward Lunges: Both;10 reps;Limitations Forward Lunges Limitations: 6in step Side Lunges: 10 reps;Limitations Side Lunges Limitations: 6" Functional Squat: 5 reps;Limitations Functional Squat Limitations: squat matrix 7 positions SLS with Vectors: 3x 5" Other Standing Knee Exercises: Single leg balance reach common 10x sagittal plane only     Physical Therapy Assessment and Plan PT Assessment and Plan Clinical Impression Statement: Pt with most difficulty performing squats and single leg reach in correct form.  Worked on sagittal plane single leg reach only with therapist facilitation to squat instead of lunge.  Pt with noted instability needing UE assist at times to steady self.  No pain reported during session other than slight discomfort when deeper squat performed.  Pt completed all exercises in pain free range of motion.  PT Plan: Continue gluteal strengthening, balance activities and progres     Problem List Patient Active Problem List   Diagnosis Date Noted  . Knee pain, acute 05/01/2014  . Left  leg weakness 05/01/2014  . Hypothyroid 02/28/2014  . Dilated cbd, acquired 06/22/2013  . Hematuria 04/12/2013    PT - End of Session Activity Tolerance: Patient tolerated treatment well General Behavior During Therapy: [facility] for tasks assessed/performed   Teena Irani, PTA/CLT 05/20/2014, 10:06 AM

## 2014-05-22 ENCOUNTER — Ambulatory Visit (HOSPITAL_COMMUNITY)
Admission: RE | Admit: 2014-05-22 | Discharge: 2014-05-22 | Disposition: A | Discharge status: Home / Self Care | Payer: Managed Care, Other (non HMO) | Source: Ambulatory Visit | Attending: Orthopaedic Surgery | Admitting: Orthopaedic Surgery

## 2014-05-22 DIAGNOSIS — M25562 Pain in left knee: Secondary | ICD-10-CM

## 2014-05-22 DIAGNOSIS — Z5189 Encounter for other specified aftercare: Secondary | ICD-10-CM | POA: Diagnosis not present

## 2014-05-22 DIAGNOSIS — R29898 Other symptoms and signs involving the musculoskeletal system: Secondary | ICD-10-CM

## 2014-05-22 NOTE — Progress Notes (Signed)
Physical Therapy Treatment Patient Details  Name: Yvette Joyce MRN: [id number] Date of Birth: 1961-05-28  Today's Date: 05/22/2014 Time: 0800-0840 PT Time Calculation (min): 40 min Charge:  There ex 800-840  Visit#: 9 of 18   Subjective: Symptoms/Limitations Symptoms: Mornings continue to be better.  Pt states she can tell that getting into lower cabinets are easier for her now.  Pain Assessment Pain Score: 3  Pain Location: Knee Pain Orientation: Left;Medial Exercise/Treatments    Stretches Active Hamstring Stretch: 3 reps;30 seconds;Limitations Active Hamstring Stretch Limitations: 14" box Gastroc Stretch: 3 reps;30 seconds;Limitations Gastroc Stretch Limitations: Slantboard   Standing Heel Raises: 10 reps;Limitations Heel Raises Limitations: combine with squat to 6" step lifting ball  Knee Flexion: 10 reps;Limitations Knee Flexion Limitations: 4# Forward Lunges: 10 reps;Left Forward Lunges Limitations: 6in step Side Lunges: 10 reps;Limitations Lateral Step Up: Hand Hold: 0;Step Height: 4";10 reps Forward Step Up: 10 reps;Step Height: 4";Hand Hold: 0 Rocker Board: 2 minutes SLS with Vectors: 3x 10" Other Standing Knee Exercises: Warrior I x 1' B; Warrior III x 30"  Seated Other Seated Knee Exercises: sit to stand x 15 Lt LE in back   Physical Therapy Assessment and Plan PT Assessment and Plan Clinical Impression Statement: Pt continues to have difficulty with balance needing therapist facilitation.  Pt demonstrates improved strength but needs constant cuing in order to complete exercises correctily.  Worked on no UE assist to improve balance.  PT Plan: concentrate on activites that combine balance and strength.     Goals  progressing   Problem List Patient Active Problem List   Diagnosis Date Noted  . Knee pain, acute 05/01/2014  . Left leg weakness 05/01/2014  . Hypothyroid 02/28/2014  . Dilated cbd, acquired 06/22/2013  . Hematuria 04/12/2013        GP    [name],[name] 05/22/2014, 8:46 AM

## 2014-05-24 ENCOUNTER — Ambulatory Visit (HOSPITAL_COMMUNITY): Payer: Managed Care, Other (non HMO) | Admitting: Physical Therapy

## 2014-05-27 ENCOUNTER — Ambulatory Visit (HOSPITAL_COMMUNITY)
Admission: RE | Admit: 2014-05-27 | Discharge: 2014-05-27 | Disposition: A | Discharge status: Home / Self Care | Payer: Managed Care, Other (non HMO) | Source: Ambulatory Visit | Attending: Internal Medicine | Admitting: Internal Medicine

## 2014-05-27 DIAGNOSIS — R29898 Other symptoms and signs involving the musculoskeletal system: Secondary | ICD-10-CM

## 2014-05-27 DIAGNOSIS — Z5189 Encounter for other specified aftercare: Secondary | ICD-10-CM | POA: Diagnosis not present

## 2014-05-27 DIAGNOSIS — M25562 Pain in left knee: Secondary | ICD-10-CM

## 2014-05-27 NOTE — Progress Notes (Signed)
Physical Therapy Treatment Patient Details  Name: Yvette Joyce MRN: [id number] Date of Birth: Mar 26, 1961  Today's Date: 05/27/2014 Time: 7001-7494 PT Time Calculation (min): 34 min Charge: TE [id number]  Visit#: 10 of 18  Re-eval: 06/10/14 Assessment Diagnosis: Lt arthroscopic surgery  Surgical Date: 03/21/14 Next MD Visit: [facility] End of October Prior Therapy: none   Authorization: [employer]  Authorization Time Period:    Authorization Visit#:   of     Subjective: Symptoms/Limitations Symptoms: Pt feels her knee is getting better evert day.  Reports she went to festival over weekend.  Current pain scale 2/10 medial knee Pain Assessment Currently in Pain?: Yes Pain Score: 2  Pain Location: Knee Pain Orientation: Left;Medial  Objective:   Exercise/Treatments Stretches Active Hamstring Stretch: 3 reps;30 seconds;Limitations Active Hamstring Stretch Limitations: 14" box Gastroc Stretch: 3 reps;30 seconds;Limitations Gastroc Stretch Limitations: Slantboard Standing Heel Raises: 15 reps;Limitations Heel Raises Limitations: combine with squat to 6" step lifting ball  Forward Lunges: 15 reps;Left;Limitations Forward Lunges Limitations: 6in step Side Lunges: 10 reps;Limitations Side Lunges Limitations: 6in step Lateral Step Up: Left;10 reps;Hand Hold: 1;Step Height: 6" SLS with Vectors: 3x 10" Other Standing Knee Exercises: Warrior I x 1", II x 30" and III 3x 30"; Tree 3x 30" Other Standing Knee Exercises: Single leg reach common and uncommon on airex 5x each    Physical Therapy Assessment and Plan PT Assessment and Plan Clinical Impression Statement: Session focus on balance activities for strengthening.  Continued with warrior poses and vector stance for stabilty, added tree pose and SLS reaching activities on dynamic surface.  Majority of activities complete wtih no HHA.  Began reciprocal stair training for functional strengthening, pt able to ascend and descend stairs  with 1 HR assistance.  Noted weak eccentric control descending stairs. PT Plan: concentrate on activites that combine balance and strength.     Goals PT Short Term Goals PT Short Term Goal 1: Pt pain to be no greater than a 2/10 PT Short Term Goal 2: Pt to be able to squat down without increased pain PT Short Term Goal 2 - Progress: Progressing toward goal PT Short Term Goal 3: Pt to be able to sit for 30 minutes to enjoy a meal  PT Long Term Goals PT Long Term Goal 1: Pt I in advance HEP  PT Long Term Goal 1 - Progress: Progressing toward goal PT Long Term Goal 2: Pt to be able to RTW  PT Long Term Goal 2 - Progress: Progressing toward goal  Problem List Patient Active Problem List   Diagnosis Date Noted  . Knee pain, acute 05/01/2014  . Left leg weakness 05/01/2014  . Hypothyroid 02/28/2014  . Dilated cbd, acquired 06/22/2013  . Hematuria 04/12/2013    PT - End of Session Activity Tolerance: Patient tolerated treatment well General Behavior During Therapy: [facility] for tasks assessed/performed  GP    Aldona Lento 05/27/2014, 12:12 PM

## 2014-05-29 ENCOUNTER — Ambulatory Visit (HOSPITAL_COMMUNITY)
Admission: RE | Admit: 2014-05-29 | Discharge: 2014-05-29 | Disposition: A | Discharge status: Home / Self Care | Payer: Managed Care, Other (non HMO) | Source: Ambulatory Visit | Attending: Internal Medicine | Admitting: Internal Medicine

## 2014-05-29 DIAGNOSIS — Z5189 Encounter for other specified aftercare: Secondary | ICD-10-CM | POA: Diagnosis not present

## 2014-05-29 NOTE — Progress Notes (Signed)
Physical Therapy Treatment Patient Details  Name: Yvette Joyce MRN: [id number] Date of Birth: Jan 11, 1961  Today's Date: 05/29/2014 Time: 0802-0841 PT Time Calculation (min): 39 min 39' TE  Visit#: 11 of 18  Re-eval:      Authorization:    Authorization Time Period:    Authorization Visit#:   of     Subjective: Symptoms/Limitations Symptoms: L medial knee pain 2/10; medicine not taken patient trying to not take anything     Exercise/Treatments      Stretches Active Hamstring Stretch: 3 reps;30 seconds;Limitations Active Hamstring Stretch Limitations: 14" box Gastroc Stretch: 3 reps;30 seconds;Limitations Gastroc Stretch Limitations: Slantboard   Standing Heel Raises: 15 reps;Limitations Forward Lunges: 15 reps;Left (with yellow lifting ball;flamingo on left ) Forward Lunges Limitations: 6in step Side Lunges: 10 reps;Limitations Side Lunges Limitations: 6in step Lateral Step Up: Left;10 reps;Hand Hold: 1;Step Height: 6" Forward Step Up: 10 reps;Hand Hold: 0;Step Height: 8" Step Down: Left;10 reps;Hand Hold: 0;Step Height: 6" SLS with Vectors: 3x 10" in each direction Other Standing Knee Exercises: Warrior I x 1", II x 30" and III 3x 30"; Tree 3x 30"           Physical Therapy Assessment and Plan PT Assessment and Plan Clinical Impression Statement: Added step downs for weak eccentric control and continued with strengthing exercises while incorporating balance, patient did well able to recover on own with any LOB.  PT Plan: concentrate on activites that combine balance and strength.     Goals    Problem List Patient Active Problem List   Diagnosis Date Noted  . Knee pain, acute 05/01/2014  . Left leg weakness 05/01/2014  . Hypothyroid 02/28/2014  . Dilated cbd, acquired 06/22/2013  . Hematuria 04/12/2013    PT - End of Session Activity Tolerance: Patient tolerated treatment well General Behavior During Therapy: [facility] for tasks  assessed/performed  GP    Irvan Tiedt, [place] 05/29/2014, 9:51 AM

## 2014-05-31 ENCOUNTER — Ambulatory Visit (HOSPITAL_COMMUNITY): Payer: Managed Care, Other (non HMO) | Admitting: Physical Therapy

## 2014-06-03 ENCOUNTER — Ambulatory Visit (HOSPITAL_COMMUNITY)
Admission: RE | Admit: 2014-06-03 | Discharge: 2014-06-03 | Disposition: A | Discharge status: Home / Self Care | Payer: Managed Care, Other (non HMO) | Source: Ambulatory Visit | Attending: Orthopaedic Surgery | Admitting: Orthopaedic Surgery

## 2014-06-03 DIAGNOSIS — Z5189 Encounter for other specified aftercare: Secondary | ICD-10-CM | POA: Diagnosis not present

## 2014-06-03 DIAGNOSIS — R29898 Other symptoms and signs involving the musculoskeletal system: Secondary | ICD-10-CM

## 2014-06-03 NOTE — Progress Notes (Signed)
Physical Therapy Treatment Patient Details  Name: Yvette Joyce MRN: [id number] Date of Birth: 01-May-1961  Today's Date: 06/03/2014 Time: 1515-1600 PT Time Calculation (min): 45 min TE 1515-1600  Visit#: 12 of 18  Re-eval: 06/10/14 Assessment Diagnosis: Lt arthroscopic surgery  Surgical Date: 03/21/14 Next MD Visit: [place] 06/12/14   Subjective: Symptoms/Limitations Symptoms: Pt reports pain/stiffness in the morning upon waking, though no complaints of pain now.  Pain Assessment Currently in Pain?: No/denies   Exercise/Treatments Stretches Active Hamstring Stretch: 3 reps;30 seconds;Limitations Active Hamstring Stretch Limitations: 14" box Gastroc Stretch: 3 reps;30 seconds;Limitations Gastroc Stretch Limitations: Slantboard Standing Heel Raises: 2 sets;5 reps Heel Raises Limitations: Combine with squat (5 squat, 5 sumo squat) with yellow ball lift Forward Lunges: 2 sets;10 reps Forward Lunges Limitations: 6in step Side Lunges: 2 sets;10 reps Side Lunges Limitations: 6in step Lateral Step Up: 10 reps;Both;Hand Hold: 0;Step Height: 8" Forward Step Up: 10 reps;Hand Hold: 0;Step Height: 8" Step Down: 10 reps;Hand Hold: 0;Step Height: 8" SLS: Tree Pose Lt 47" max, Rt 13" max  Other Standing Knee Exercises: Warrior Pose I x30" Other Standing Knee Exercises: Single leg reach common and uncommon on airex 5x each      Physical Therapy Assessment and Plan PT Assessment and Plan Clinical Impression Statement: Focused session on increasing control of Lt LE, with concentric and eccentric controls with step ups and lunges.  Pt with tendency to limit activity, particularly lunges and squats as pt reports "fear" of WB on Lt LE;  limited HHA with VC to decrease reliance on handrails with strengthening and balance exercises.  Noted great improvements in SLS on the Lt LE today to 47" without HHA!  Pt reprots some fear with return to work, as her job requires her to squat and bend  multiple repeititons throughout the 10-12 hour work shift (educated pt to speak to MD regarding any limitations if retrurned to work, or limitaions on time at work as needed with MD visit next week).  Pt will benefit from skilled therapeutic intervention in order to improve on the following deficits: Decreased mobility;Decreased activity tolerance;Pain;Decreased balance PT Plan: concentrate on activites that combine balance and strength.     Goals PT Short Term Goals PT Short Term Goal 1: Pt pain to be no greater than a 2/10 PT Short Term Goal 1 - Progress: Progressing toward goal PT Short Term Goal 2: Pt to be able to squat down without increased pain (Pt reprots "discomfort" with squatting today) PT Short Term Goal 2 - Progress: Progressing toward goal  Problem List Patient Active Problem List   Diagnosis Date Noted  . Knee pain, acute 05/01/2014  . Left leg weakness 05/01/2014  . Hypothyroid 02/28/2014  . Dilated cbd, acquired 06/22/2013  . Hematuria 04/12/2013    PT - End of Session Activity Tolerance: Patient tolerated treatment well General Behavior During Therapy: [facility] for tasks assessed/performed   Kameisha Malicki 06/03/2014, 4:03 PM

## 2014-06-05 ENCOUNTER — Ambulatory Visit (HOSPITAL_COMMUNITY)
Admission: RE | Admit: 2014-06-05 | Discharge: 2014-06-05 | Disposition: A | Discharge status: Home / Self Care | Payer: Managed Care, Other (non HMO) | Source: Ambulatory Visit | Attending: Orthopaedic Surgery | Admitting: Orthopaedic Surgery

## 2014-06-05 DIAGNOSIS — Z5189 Encounter for other specified aftercare: Secondary | ICD-10-CM | POA: Diagnosis not present

## 2014-06-05 NOTE — Progress Notes (Signed)
Physical Therapy Treatment Patient Details  Name: Yvette Joyce MRN: [id number] Date of Birth: [birth year]  Today's Date: 06/05/2014 Time: 0800-0845 PT Time Calculation (min): 45 min Visit#: 13 of 18  Re-eval: 06/10/14 Charges:  therex 45  Subjective: Symptoms/Limitations Symptoms: Pt states she was getting down low into her cabinets last night to clean her tupperware cabinet out and her knee is a little sore today.  No pain today only soreness around her kneecap Pain Assessment Currently in Pain?: No/denies   Exercise/Treatments Stretches Active Hamstring Stretch: 3 reps;30 seconds;Limitations Active Hamstring Stretch Limitations: 14" box Gastroc Stretch: 3 reps;30 seconds;Limitations Gastroc Stretch Limitations: Slantboard Standing Heel Raises: 2 sets;5 reps Heel Raises Limitations: Combine with squat (5 squat, 5 sumo squat) with yellow ball lift Forward Lunges: 2 sets;10 reps Forward Lunges Limitations: 6in step Side Lunges: 2 sets;10 reps Side Lunges Limitations: 6in step Lateral Step Up: 10 reps;Both;Hand Hold: 0;Step Height: 8" Forward Step Up: 10 reps;Hand Hold: 0;Both;Step Height: 8" Step Down: 10 reps;Hand Hold: 0;Step Height: 8" Stairs: 4, 7" steps reciprocally no HR 5 RT SLS: Tree Pose Lt 47" max, Rt 13" max  Other Standing Knee Exercises: Warrior Pose I x30"   Physical Therapy Assessment and Plan PT Assessment and Plan Clinical Impression Statement: Continued  Focus on increasing control of Lt LE, with concentric and eccentric activities.  Pt able to complete mostly without UE asssit, however most difficulty with single leg stance activity.  Pt reprots concern with RTW due to having to squat to replace paper in machine every 10 minutes during her 10-12 hour work shift.   Worked on negotiating stairs reciprocally without difficulty.   PT Plan: concentrate on activites that combine balance and strength. Re-eval next visit for MD appointment (fax report).  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     Problem List Patient Active Problem List   Diagnosis Date Noted  . Knee pain, acute 05/01/2014  . Left leg weakness 05/01/2014  . Hypothyroid 02/28/2014  . Dilated cbd, acquired 06/22/2013  . Hematuria 04/12/2013    PT - End of Session Activity Tolerance: Patient tolerated treatment well General Behavior During Therapy: [facility] for tasks assessed/performed   Teena Irani, PTA/CLT 06/05/2014, 8:47 AM

## 2014-06-07 ENCOUNTER — Ambulatory Visit (HOSPITAL_COMMUNITY)
Admission: RE | Admit: 2014-06-07 | Discharge: 2014-06-07 | Disposition: A | Discharge status: Home / Self Care | Payer: Managed Care, Other (non HMO) | Source: Ambulatory Visit | Attending: Orthopaedic Surgery | Admitting: Orthopaedic Surgery

## 2014-06-07 DIAGNOSIS — Z5189 Encounter for other specified aftercare: Secondary | ICD-10-CM | POA: Diagnosis not present

## 2014-06-07 NOTE — Progress Notes (Signed)
Physical Therapy Treatment Patient Details  Name: Yvette Joyce MRN: [id number] Date of Birth: 1961-07-12  Today's Date: 06/07/2014 Time: 0802-0847 PT Time Calculation (min): 45 min TE 0272-5366  Visit#: 14 of 18  Re-eval: 06/10/14 Assessment Diagnosis: Lt arthroscopic surgery  Surgical Date: 03/21/14 Next MD Visit: [facility] 06/12/14   Subjective: Symptoms/Limitations Symptoms: Pt reports she was bending a lot yesterday to pick up sticks, with increases in pain to 6-7/10 last night; pt reports she laid in a hot bath last night to decrease pain.  Pt reports mild complaints of pain today.   Pain Assessment Currently in Pain?: Yes Pain Score: 2  Pain Location: Knee Pain Orientation: Left;Medial Pain Type: Surgical pain   Exercise/Treatments Stretches Active Hamstring Stretch: 3 reps;20 seconds Active Hamstring Stretch Limitations: 3 way, 14" box Quad Stretch: 2 reps;30 seconds Quad Stretch Limitations: Knee propped up, prone with rope ITB Stretch: 2 reps;30 seconds ITB Stretch Limitations: Supine with rope Gastroc Stretch: 3 reps;30 seconds;Limitations Gastroc Stretch Limitations: Retail buyer for Strengthening Cybex Knee Flexion: (B) LE 5 plates, Lt LE 2 plates x10 each Standing Side Lunges: 10 reps;Both Side Lunges Limitations: yellow ball to floor Lateral Step Up: 10 reps;Hand Hold: 0 (12" Step) Forward Step Up: 10 reps;Hand Hold: 0 (12" Step) Functional Squat: 20 reps Functional Squat Limitations: yellow ball to floor Stairs: 7" ascend every other x5, no HHA SLS: Tree Pose Lt 60" max, Rt 47" max   Physical Therapy Assessment and Plan PT Assessment and Plan Clinical Impression Statement: Pt brought steel toes shoes to clinic to complete exercises in as wt of shoes is heavier than regular shoes. Multiple reptitions of squatting exercises and step up exercises performed today to build up endurance for work related activites.  Increased height of step up box  to level pt reports she completes at work; VC and encouragement provided to decrease use of UE during step ups and rely on strength of Lt LE.   PT Plan:  Re-eval next visit for MD appointment (fax report).  Add lifting/carrying with box to simulate work duty.     Goals PT Short Term Goals PT Short Term Goal 1: Pt pain to be no greater than a 2/10 (Pt reports pain typically at a 2/10, though pain did increase lat night to 6-7/10 after bending multiple times to pick up sticks. ) PT Short Term Goal 1 - Progress: Progressing toward goal PT Short Term Goal 2: Pt to be able to squat down without increased pain PT Short Term Goal 2 - Progress: Progressing toward goal PT Long Term Goals PT Long Term Goal 1: Pt I in advance HEP  PT Long Term Goal 1 - Progress: Progressing toward goal  Problem List Patient Active Problem List   Diagnosis Date Noted  . Knee pain, acute 05/01/2014  . Left leg weakness 05/01/2014  . Hypothyroid 02/28/2014  . Dilated cbd, acquired 06/22/2013  . Hematuria 04/12/2013    PT - End of Session Activity Tolerance: Patient tolerated treatment well General Behavior During Therapy: [facility] for tasks assessed/performed   Yvette Joyce 06/07/2014, 8:53 AM

## 2014-06-10 ENCOUNTER — Ambulatory Visit (HOSPITAL_COMMUNITY)
Admission: RE | Admit: 2014-06-10 | Discharge: 2014-06-10 | Disposition: A | Discharge status: Home / Self Care | Payer: Managed Care, Other (non HMO) | Source: Ambulatory Visit | Attending: Orthopaedic Surgery | Admitting: Orthopaedic Surgery

## 2014-06-10 DIAGNOSIS — Z5189 Encounter for other specified aftercare: Secondary | ICD-10-CM | POA: Diagnosis not present

## 2014-06-10 NOTE — Progress Notes (Signed)
Physical Therapy  Re-evaluation  Patient Details  Name: Yvette Joyce MRN: [id number] Date of Birth: Jun 16, 1961  Today's Date: 06/10/2014 Time: 6979-4801 PT Time Calculation (min): 49 min              Visit#: 15 of 18  Re-eval: 06/10/14 Assessment Diagnosis: Lt arthroscopic surgery  Surgical Date: 03/21/14 Next MD Visit: [facility] 06/12/14 Charges:  therex [phone], MMT [phone], self care [phone]   Subjective Symptoms/Limitations Symptoms: Pt comes today with steel toed work boots to simulate work actvities.  Reports she is overall improved with pain no greater than 6/10 and only with repetitive activites.  Majority of time pain averages 2/10.  No difficulty or increased pain with walking or biking. Pain Assessment Currently in Pain?: No/denies   Sensation/Coordination/Flexibility/Functional Tests Functional Tests Functional Tests: foto 62% FS (was 60%)  Assessment LLE Strength Left Hip Flexion: 5/5 (was 3+/5) Left Hip Extension: 4/5 (was 3+/5) Left Hip ABduction: 5/5 Left Knee Flexion: 5/5 (was 4/5) Left Knee Extension: 5/5 Left Ankle Dorsiflexion: 5/5 Left Ankle Plantar Flexion: 5/5  Exercise/Treatments Stretches Active Hamstring Stretch: 3 reps;20 seconds Active Hamstring Stretch Limitations: 3 way, 14" box Gastroc Stretch: 3 reps;30 seconds;Limitations Gastroc Stretch Limitations: Slantboard Standing Functional Squat: 20 reps Functional Squat Limitations: yellow ball to floor Other Standing Knee Exercises: work sim actvities stepping up on 10" box X 10 reps     Physical Therapy Assessment and Plan PT Assessment and Plan Clinical Impression Statement: Pt has completed 15 PT visits following Lt knee arthroscopy. Pt has overall improved with decreased pain and increased function and feels she is ready to RTW part time or decreased shift.   Pt has met 1 STG and 1 LTG and continues to progress toward other goals.   Pt has began to complete work simulation actvities  in therapy wearing her steel toe boots. Pt with overall good functional mobility with minimal increase in pain. Pt would benefit from continued therapy to progress towards all unment goals whille RTW. PT Frequency: Min 2X/week PT Duration: 4 weeks PT Plan:  Recommend continuation of therapy with decreased frequency to 2X week.  Progress work simulation activities.    Goals PT Short Term Goals PT Short Term Goal 1: Pt pain to be no greater than a 2/10 (Pt reports pain typically at a 2/10 with light activites, walking.  Increases to 6/10 after bending/squatting multiple times or repetitively) PT Short Term Goal 1 - Progress: Progressing toward goal PT Short Term Goal 2: Pt to be able to squat down without increased pain PT Short Term Goal 2 - Progress: Progressing toward goal PT Short Term Goal 3 - Progress: Met PT Long Term Goals PT Long Term Goal 1: Pt I in advance HEP  PT Long Term Goal 1 - Progress: Met PT Long Term Goal 2 - Progress: Progressing toward goal  Problem List Patient Active Problem List   Diagnosis Date Noted  . Knee pain, acute 05/01/2014  . Left leg weakness 05/01/2014  . Hypothyroid 02/28/2014  . Dilated cbd, acquired 06/22/2013  . Hematuria 04/12/2013    PT - End of Session Activity Tolerance: Patient tolerated treatment well General Behavior During Therapy: [facility] for tasks assessed/performed  Teena Irani, PTA/CLT 06/10/2014, 9:59 AM  Physician Documentation Your signature is required to indicate approval of the treatment plan as stated above.  Please sign and either send electronically or make a copy of this report for your files and return this physician signed original.   Please mark  one 1.__approve of plan  2. ___approve of plan with the following conditions.   ______________________________                                                          _____________________ Physician Signature                                                                                                              Date

## 2014-06-12 ENCOUNTER — Ambulatory Visit (HOSPITAL_COMMUNITY): Payer: Managed Care, Other (non HMO) | Admitting: Physical Therapy

## 2014-06-14 ENCOUNTER — Ambulatory Visit (HOSPITAL_COMMUNITY): Payer: Managed Care, Other (non HMO) | Admitting: Physical Therapy

## 2014-06-18 ENCOUNTER — Ambulatory Visit (HOSPITAL_COMMUNITY)
Admission: RE | Admit: 2014-06-18 | Discharge: 2014-06-18 | Disposition: A | Discharge status: Home / Self Care | Payer: Managed Care, Other (non HMO) | Source: Ambulatory Visit | Attending: Internal Medicine | Admitting: Internal Medicine

## 2014-06-18 DIAGNOSIS — M6281 Muscle weakness (generalized): Secondary | ICD-10-CM | POA: Diagnosis not present

## 2014-06-18 DIAGNOSIS — R29898 Other symptoms and signs involving the musculoskeletal system: Secondary | ICD-10-CM

## 2014-06-18 DIAGNOSIS — R2689 Other abnormalities of gait and mobility: Secondary | ICD-10-CM | POA: Diagnosis not present

## 2014-06-18 DIAGNOSIS — Z5189 Encounter for other specified aftercare: Secondary | ICD-10-CM | POA: Diagnosis present

## 2014-06-18 DIAGNOSIS — M25562 Pain in left knee: Secondary | ICD-10-CM | POA: Diagnosis not present

## 2014-06-18 NOTE — Therapy (Addendum)
Physical Therapy Treatment  Patient Details  Name: Yvette Joyce MRN: [id number] Date of Birth: [birth year]  Encounter Date: 06/18/2014      PT End of Session - 06/18/14 0848    Visit Number 16   Number of Visits 23   Date for PT Re-Evaluation 07/08/14   PT Start Time 0802   PT Stop Time 0845   PT Time Calculation (min) 43 min   PT Charge Details TE [phone]   Activity Tolerance Patient tolerated treatment well      Past Medical History  Diagnosis Date  . Hypothyroidism   . Headache(784.0)   . Constipation   . Complication of anesthesia     "hard to wake up"  . Hematuria 04/12/2013    Past Surgical History  Procedure Laterality Date  . Fibroids removed from uterus    . Cosmetic eye surgery    . Colonoscopy  09/24/2011    Procedure: COLONOSCOPY;  Surgeon: Dorothyann Peng, MD;  Location: [facility];  Service: Endoscopy;  Laterality: N/A;  9:30 AM  . Nasal septum surgery    . Polypectomy  12/03/2011    Procedure: POLYPECTOMY;  Surgeon: Jonnie Kind, MD;  Location: [facility] ORS;  Service: Gynecology;  Laterality: N/A;  resection of polyp  . Hysteroscopy w/d&c  12/03/2011    Procedure: DILATATION AND CURETTAGE /HYSTEROSCOPY;  Surgeon: Jonnie Kind, MD;  Location: [facility] OR[facility];  Service: Gynecology;  Laterality: N/A;  . Laparoscopic supracervical hysterectomy  07/11/2012    Procedure: LAPAROSCOPIC SUPRACERVICAL HYSTERECTOMY;  Surgeon: Jonnie Kind, MD;  Location: [facility];  Service: Gynecology;  Laterality: N/A;  . Laparoscopic salpingo oopherectomy  07/11/2012    Procedure: LAPAROSCOPIC SALPINGO OOPHORECTOMY;  Surgeon: Jonnie Kind, MD;  Location: [facility];  Service: Gynecology;  Laterality: Bilateral;  . Abdominal hysterectomy      LMP 11/25/2011  Visit Diagnosis:  Left leg weakness  Knee pain, acute, left   Subjective: Returned to work 8 hr day yesterday, knee is a little sore today following.  Current pain scale 3-4/10 Pain scale 4/10 Lt knee      Adult PT  Treatment/Exercise - 06/18/14 0700    Active Hamstring Stretch 3 reps;30 seconds;Limitations   Active Hamstring Stretch Limitations 3 way, 14" box   ITB Stretch 1 rep;2 reps;30 seconds;Limitations   ITB Stretch Limitations supine with rope; standing by 6 in   Gastroc Stretch 3 reps;30 seconds;Limitations   Gastroc Stretch Limitations Slantboard   Cybex Knee Extension 2Pl Bil extend with Lt eccentric control    Cybex Knee Flexion (B) LE 3 PL 2x 10 (reduced due to soreness from RTW)   Cybex Leg Press 4 PL Bil LE   Functional Squat 20 reps   Functional Squat Limitations yellow ball to floor   Other Standing Knee Exercises pick up and reach with 3 and 4#, UE ground matrix   Other Standing Knee Exercises work sim actvities stepping up on 10" box X 10 reps   Other Prone Exercises UE ground matrix 2 sets each UE            PT Short Term Goals - 06/18/14 0816    Title Pt pain to be no greater than a 2/10   Status On-going   Title Pt to be able to squat down without increased pain   Status On-going   Title Pt to be able to sit for 30 minutes to enjoy a meal    Status Achieved  PT Long Term Goals - 06/18/14 0818    Title Pt I in advance HEP     Status On-going   Title Pt to be able to RTW     Status Partially Met          Plan - 06/18/14 0854    Clinical Impression Statement Session focus on RTW activities for knee strengthening including proper lifting, stair training and rotation for RTW.  Pt limited by fatigue at end of session.  Therapist facilitation initially for proper form with squats.  Began ground matrix to improve core strengthening with rotation.  Stated pain reduced at end of session.  Plans to apply ice at home for pain and edema control.     PT Plan Continue with current PT POC for RTW simulation activities.  Next session begin LE ground matrix.          Problem List Patient Active Problem List   Diagnosis Date Noted  . Knee pain, acute 05/01/2014   . Left leg weakness 05/01/2014  . Hypothyroid 02/28/2014  . Dilated cbd, acquired 06/22/2013  . Hematuria 04/12/2013    Aldona Lento, PTA  Aldona Lento 06/18/2014, 9:04 AM

## 2014-06-24 ENCOUNTER — Ambulatory Visit (HOSPITAL_COMMUNITY): Payer: Managed Care, Other (non HMO) | Admitting: Physical Therapy

## 2014-06-26 ENCOUNTER — Ambulatory Visit (HOSPITAL_COMMUNITY): Payer: Managed Care, Other (non HMO) | Admitting: Physical Therapy

## 2014-07-01 ENCOUNTER — Ambulatory Visit (HOSPITAL_COMMUNITY): Payer: Managed Care, Other (non HMO) | Admitting: Physical Therapy

## 2014-07-03 ENCOUNTER — Ambulatory Visit (HOSPITAL_COMMUNITY): Payer: Managed Care, Other (non HMO) | Admitting: Physical Therapy

## 2014-07-03 ENCOUNTER — Ambulatory Visit (HOSPITAL_COMMUNITY): Payer: Managed Care, Other (non HMO)

## 2014-07-08 ENCOUNTER — Ambulatory Visit (HOSPITAL_COMMUNITY): Payer: Managed Care, Other (non HMO) | Admitting: Physical Therapy

## 2014-07-09 NOTE — Addendum Note (Signed)
Encounter addended by: Leeroy Cha, PT on: 07/09/2014  4:46 PM<BR>     Documentation filed: Clinical Notes

## 2014-07-09 NOTE — Therapy (Signed)
  Patient Details  Name: IZZABELL KLASEN MRN: [id number] Date of Birth: Dec 15, 1960  Encounter Date: 06/18/2014 PHYSICAL THERAPY DISCHARGE SUMMARY  Visits from Start of Care:16 Current functional level related to goals / functional outcomes: I    Remaining deficits: pain   Pt cx all remaining visits secondary to going back to work full time   Plan: Patient agrees to discharge.  Patient goals were partially met. Patient is being discharged due to being pleased with the current functional level.  ?????      RUSSELL,[name]  PT  07/09/2014, 4:27 PM

## 2014-07-10 ENCOUNTER — Ambulatory Visit (HOSPITAL_COMMUNITY): Payer: Managed Care, Other (non HMO) | Admitting: Physical Therapy

## 2014-07-10 ENCOUNTER — Encounter (HOSPITAL_COMMUNITY): Payer: Managed Care, Other (non HMO) | Admitting: Physical Therapy

## 2014-08-05 ENCOUNTER — Other Ambulatory Visit: Payer: Self-pay

## 2014-08-05 ENCOUNTER — Other Ambulatory Visit: Payer: Self-pay | Admitting: Adult Health

## 2014-08-05 DIAGNOSIS — Z1231 Encounter for screening mammogram for malignant neoplasm of breast: Secondary | ICD-10-CM

## 2014-09-06 ENCOUNTER — Ambulatory Visit: Payer: Managed Care, Other (non HMO)

## 2014-09-13 ENCOUNTER — Ambulatory Visit
Admission: RE | Admit: 2014-09-13 | Discharge: 2014-09-13 | Disposition: A | Discharge status: Home / Self Care | Payer: Managed Care, Other (non HMO) | Source: Ambulatory Visit

## 2014-09-13 DIAGNOSIS — Z1231 Encounter for screening mammogram for malignant neoplasm of breast: Secondary | ICD-10-CM

## 2014-11-13 ENCOUNTER — Ambulatory Visit (INDEPENDENT_AMBULATORY_CARE_PROVIDER_SITE_OTHER): Payer: Managed Care, Other (non HMO) | Admitting: Nurse Practitioner

## 2014-11-13 ENCOUNTER — Encounter: Payer: Self-pay | Admitting: Nurse Practitioner

## 2014-11-13 VITALS — BP 122/79 | HR 76 | Temp 97.3°F | Ht 67.0 in | Wt 132.2 lb

## 2014-11-13 DIAGNOSIS — K649 Unspecified hemorrhoids: Secondary | ICD-10-CM | POA: Insufficient documentation

## 2014-11-13 MED ORDER — HYDROCORTISONE ACETATE 25 MG RE SUPP: 25.0000 mg | Freq: Two times a day (BID) | RECTAL | Status: DC

## 2014-11-13 NOTE — Progress Notes (Signed)
cc'ed to pcp °

## 2014-11-13 NOTE — Assessment & Plan Note (Signed)
54 year old female with a history of internal hemorrhoids on last colonoscopy. Has recently had a flare up of her hemorrhoid symptoms including rectal irritation and pain for about the past 4 days. Symptoms are slowly improving however the pain remains. Has not had any recent bouts of constipation. Discussed long-term options for internal hemorrhoids including in office banding, but at this time is most prudent to try suppository medications to relieve symptoms. If symptoms are persistent or more frequent and recurrent consider banding at that time. Able to this. We'll: Anusol suppositories to take twice a day for 7 days with one refill for any recurrences. Instructed to call if no improvement or worsening of symptoms despite treatment.

## 2014-11-13 NOTE — Progress Notes (Signed)
Referring Provider: Redmond School, MD Primary Care Physician:  Glo Herring., MD Primary GI:  Dr. Oneida Alar  Chief Complaint  Patient presents with  . Hemorrhoids    HPI:   54 year old female presents for evaluation of hemorrhoids. Last colonoscopy 09/24/2011 where a sessile polyp was found in the ascending colon, diverticular from the sigmoid colon, internal hemorrhoids found. Pathology of the sessile polyp should tubular adenoma 1, negative for high-grade dysplasia or malignancy. Recommended repeat colonoscopy in 10 years.  Today she states she has a history of hemorrhoids. She went to the beach and held her 30 lb dog in her lap for 4 ways each way. Since then she's been having rectal pain. The pain started about 4 days ago and is described as sharp and intermittent. Occurs about 5 times a day as well as when she uses the bathroom. Has a bowel movement about every day and her stools are typically [place] 4 on the stool scale. Has not had any constipation issues in the past 1-2 weeks. Takes MiraLAX daily. Denies hematochezia or toilet tissue hematochezia, abdominal pain, N/V. Denies fever, chills, unintentional weight loss, change in appetite or bowel habits. Denies any other upper ot lower GI symptoms.  Past Medical History  Diagnosis Date  . Hypothyroidism   . Headache(784.0)   . Constipation   . Complication of anesthesia     "hard to wake up"  . Hematuria 04/12/2013    Past Surgical History  Procedure Laterality Date  . Fibroids removed from uterus    . Cosmetic eye surgery    . Colonoscopy  09/24/2011    BJS:[id number] polyps in the ascending colon/diverticula in the sigmoid colon/internal hemorrhoids  . Nasal septum surgery    . Polypectomy  12/03/2011    Procedure: POLYPECTOMY;  Surgeon: Jonnie Kind, MD;  Location: [facility];  Service: Gynecology;  Laterality: N/A;  resection of polyp  . Hysteroscopy w/d&c  12/03/2011    Procedure: DILATATION AND CURETTAGE /HYSTEROSCOPY;   Surgeon: Jonnie Kind, MD;  Location: [facility];  Service: Gynecology;  Laterality: N/A;  . Laparoscopic supracervical hysterectomy  07/11/2012    Procedure: LAPAROSCOPIC SUPRACERVICAL HYSTERECTOMY;  Surgeon: Jonnie Kind, MD;  Location: [facility];  Service: Gynecology;  Laterality: N/A;  . Laparoscopic salpingo oopherectomy  07/11/2012    Procedure: LAPAROSCOPIC SALPINGO OOPHORECTOMY;  Surgeon: Jonnie Kind, MD;  Location: [facility];  Service: Gynecology;  Laterality: Bilateral;  . Abdominal hysterectomy      Current Outpatient Prescriptions  Medication Sig Dispense Refill  . amphetamine-dextroamphetamine (ADDERALL) 20 MG tablet Take 20 mg by mouth daily.    . Bimatoprost (LATISSE EX) Apply 1 application topically daily.    . Cholecalciferol (VITAMIN D PO) Take 1 tablet by mouth daily. Unsure of strength    . CYANOCOBALAMIN PO Take 1 tablet by mouth daily. Unsure of strength    . fish oil-omega-3 fatty acids 1000 MG capsule Take 2 g by mouth daily.    Marland Kitchen ibuprofen (ADVIL,MOTRIN) 200 MG tablet Take 400 mg by mouth every 6 (six) hours as needed. For pain    . levothyroxine (SYNTHROID, LEVOTHROID) 75 MCG tablet TAKE 1 TABLET BY MOUTH ONCE DAILY FOR THYROID. 30 tablet 6  . Multiple Vitamin (MULITIVITAMIN WITH MINERALS) TABS Take 1 tablet by mouth daily.    . polyethylene glycol powder (GLYCOLAX/MIRALAX) powder Take 17 g by mouth daily. 255 g 0  . Specialty Vitamins Products (MAGNESIUM, AMINO ACID CHELATE,) 133 MG tablet Take 1 tablet by  mouth daily.    . traMADol (ULTRAM) 50 MG tablet Take 1 tablet (50 mg total) by mouth every 6 (six) hours as needed. 15 tablet 0   No current facility-administered medications for this visit.    Allergies as of 11/13/2014  . (No Known Allergies)    Family History  Problem Relation Age of Onset  . Colon cancer Maternal Uncle   . Cancer Sister 69    breast   . Heart disease Father   . Stroke Mother     History   Social History  . Marital Status:  Legally Separated    Spouse Name: N/A  . Number of Children: N/A  . Years of Education: N/A   Social History Main Topics  . Smoking status: Never Smoker   . Smokeless tobacco: Never Used  . Alcohol Use: 0.6 oz/week    1 Shots of liquor per week     Comment: 1-2 times a week  . Drug Use: No  . Sexual Activity: Yes    Birth Control/ Protection: Surgical   Other Topics Concern  . None   Social History Narrative    Review of Systems: Gen: Denies fever, chills, anorexia. Denies fatigue, weakness, weight loss.  CV: Denies chest pain, palpitations, syncope, peripheral edema. Resp: Denies dyspnea at rest, wheezing, coughing up blood. GI: See HPI. Derm: Denies rash, itching, dry skin Psych: Denies depression, anxiety, memory loss, confusion. Heme: Denies bruising, bleeding, and enlarged lymph nodes.  Physical Exam: BP 122/79 mmHg  Pulse 76  Temp(Src) 97.3 F (36.3 C)  Ht 5\' 7"  (1.702 m)  Wt 132 lb 3.2 oz (59.966 kg)  BMI 20.70 kg/m2  LMP 11/25/2011 General:   Alert and oriented. No distress noted. Pleasant and cooperative.  Head:  Normocephalic and atraumatic. Eyes:  Conjuctiva clear without scleral icterus. Lungs:  Clear to auscultation bilaterally. No wheezes, rales, or rhonchi. No distress.  Heart:  S1, S2 present without murmurs, rubs, or gallops. Regular rate and rhythm. Abdomen:  +BS, soft, non-tender and non-distended. No rebound or guarding. No HSM or masses noted. Msk:  Symmetrical without gross deformities. Normal posture. Extremities:  Without edema. Neurologic:  Alert and  oriented x4;  grossly normal neurologically. Skin:  Intact without significant lesions or rashes. Psych:  Alert and cooperative. Normal mood and affect.    11/13/2014 9:18 AM

## 2014-11-13 NOTE — Patient Instructions (Signed)
1. I send in prescription to your pharmacy for Anusol rectal suppositories. Insert one suppository twice a day for 6 days. I called him one refill in case you have a recurrent flare in the future. 2. I'll notify us in 1 week if he did have an improvement in her symptoms, if there is any worsening of her symptoms, or you start having rectal bleeding despite treatment. 3. Otherwise follow up as needed for any GI symptoms.

## 2015-02-26 ENCOUNTER — Other Ambulatory Visit: Payer: Self-pay | Admitting: Adult Health

## 2015-03-07 ENCOUNTER — Ambulatory Visit (INDEPENDENT_AMBULATORY_CARE_PROVIDER_SITE_OTHER): Payer: Managed Care, Other (non HMO) | Admitting: Adult Health

## 2015-03-07 ENCOUNTER — Encounter: Payer: Self-pay | Admitting: Adult Health

## 2015-03-07 VITALS — BP 126/78 | HR 76 | Ht 64.5 in | Wt 134.0 lb

## 2015-03-07 DIAGNOSIS — Z1212 Encounter for screening for malignant neoplasm of rectum: Secondary | ICD-10-CM

## 2015-03-07 DIAGNOSIS — E039 Hypothyroidism, unspecified: Secondary | ICD-10-CM

## 2015-03-07 DIAGNOSIS — Z01419 Encounter for gynecological examination (general) (routine) without abnormal findings: Secondary | ICD-10-CM | POA: Diagnosis not present

## 2015-03-07 DIAGNOSIS — Z780 Asymptomatic menopausal state: Secondary | ICD-10-CM

## 2015-03-07 DIAGNOSIS — G479 Sleep disorder, unspecified: Secondary | ICD-10-CM

## 2015-03-07 DIAGNOSIS — R5383 Other fatigue: Secondary | ICD-10-CM

## 2015-03-07 HISTORY — DX: Asymptomatic menopausal state: Z78.0

## 2015-03-07 HISTORY — DX: Other fatigue: R53.83

## 2015-03-07 HISTORY — DX: Sleep disorder, unspecified: G47.9

## 2015-03-07 LAB — HEMOCCULT GUIAC POC 1CARD (OFFICE): Fecal Occult Blood, POC: NEGATIVE

## 2015-03-07 NOTE — Progress Notes (Addendum)
Patient ID: Yvette Joyce, female   DOB: 10-14-1960, 54 y.o.   MRN: [id number] History of Present Illness: Yvette Joyce is a 54 year old white female, single in for well woman gyn exam ,she is sp hysterectomy. She complains of being tired, not sleeping well and memory trouble in on adderall now for that.She thinks her voice sounds hoarse at times, no trouble swallowing or heartburn.She snores and was supposed to get sleep study by Dr Yvette Joyce but cancelled it.She works 2nd shift at [employer] and will drink 5 hour energy some times.   Current Medications, Allergies, Past Medical History, Past Surgical History, Family History and Social History were reviewed in [employer] record.     Review of Systems: Patient denies any headaches, hearing loss,  blurred vision, shortness of breath, chest pain, abdominal pain, problems with bowel movements, urination, or intercourse. No joint pain or mood swings.See HPI for positives.   Physical Exam:BP 126/78 mmHg  Pulse 76  Ht 5' 4.5" (1.638 m)  Wt 134 lb (60.782 kg)  BMI 22.65 kg/m2  LMP 11/25/2011 General:  Well developed, well nourished, no acute distress Skin:  Warm and dry,tan Neck:  Midline trachea, normal thyroid, good ROM, no lymphadenopathy Lungs; Clear to auscultation bilaterally Breast:  No dominant palpable mass, retraction, or nipple discharge Cardiovascular: Regular rate and rhythm Abdomen:  Soft, non tender, no hepatosplenomegaly Pelvic:  External genitalia is normal in appearance, no lesions.  The vagina is normal in appearance. Urethra has no lesions or masses. The cervix and uterus are absent  No adnexal masses or tenderness noted.Bladder is non tender, no masses felt. Rectal: Good sphincter tone, no polyps, or hemorrhoids felt.  Hemoccult negative. Extremities/musculoskeletal:  No swelling or varicosities noted, no clubbing or cyanosis Psych:  No mood changes, alert and cooperative,seems happy   Impression: Well  woman gyn exam no pap Fatigue Sleep disturbance PM Hypothyroid     Plan: Take with Yvette Blase PA at [facility] next month, and get him to order sleep study Check CBC,CMP,TSH  Mammogram yearly Physical in 1 year Colonoscopy per GI  Has refills on synthroid

## 2015-03-07 NOTE — Patient Instructions (Signed)
Physical in 1 year Mammogram yearly Colonoscopy per GI 

## 2015-03-08 LAB — COMPREHENSIVE METABOLIC PANEL
ALT: 24 IU/L (ref 0–32)
AST: 26 IU/L (ref 0–40)
Albumin/Globulin Ratio: 1.9 (ref 1.1–2.5)
Albumin: 4.3 g/dL (ref 3.5–5.5)
Alkaline Phosphatase: 120 IU/L — ABNORMAL HIGH (ref 39–117)
BUN / CREAT RATIO: 17 (ref 9–23)
BUN: 12 mg/dL (ref 6–24)
Bilirubin Total: 0.4 mg/dL (ref 0.0–1.2)
CHLORIDE: 97 mmol/L (ref 97–108)
CO2: 25 mmol/L (ref 18–29)
Calcium: 9.5 mg/dL (ref 8.7–10.2)
Creatinine, Ser: 0.71 mg/dL (ref 0.57–1.00)
GFR calc Af Amer: 112 mL/min/{1.73_m2} (ref >59)
GFR calc non Af Amer: 97 mL/min/{1.73_m2} (ref >59)
Globulin, Total: 2.3 g/dL (ref 1.5–4.5)
Glucose: 70 mg/dL (ref 65–99)
POTASSIUM: 4 mmol/L (ref 3.5–5.2)
Sodium: 141 mmol/L (ref 134–144)
TOTAL PROTEIN: 6.6 g/dL (ref 6.0–8.5)

## 2015-03-08 LAB — CBC
HEMATOCRIT: 42.8 % (ref 34.0–46.6)
Hemoglobin: 14 g/dL (ref 11.1–15.9)
MCH: 30.6 pg (ref 26.6–33.0)
MCHC: 32.7 g/dL (ref 31.5–35.7)
MCV: 93 fL (ref 79–97)
PLATELETS: 203 10*3/uL (ref 150–379)
RBC: 4.58 x10E6/uL (ref 3.77–5.28)
RDW: 13.6 % (ref 12.3–15.4)
WBC: 4.5 10*3/uL (ref 3.4–10.8)

## 2015-03-08 LAB — TSH: TSH: 6 u[IU]/mL — AB (ref 0.450–4.500)

## 2015-03-10 ENCOUNTER — Telehealth: Payer: Self-pay | Admitting: Adult Health

## 2015-03-10 DIAGNOSIS — E039 Hypothyroidism, unspecified: Secondary | ICD-10-CM

## 2015-03-10 MED ORDER — LEVOTHYROXINE SODIUM 88 MCG PO TABS: 88.0000 ug | ORAL_TABLET | Freq: Every day | ORAL | Status: DC

## 2015-03-10 NOTE — Telephone Encounter (Signed)
Pt aware of labs, will increase synthroid to 88 mcg and recheck TSH, October 24

## 2015-05-04 ENCOUNTER — Encounter (HOSPITAL_COMMUNITY): Payer: Self-pay | Admitting: Emergency Medicine

## 2015-05-04 ENCOUNTER — Emergency Department (HOSPITAL_COMMUNITY)
Admission: EM | Admit: 2015-05-04 | Discharge: 2015-05-04 | Disposition: A | Discharge status: Home / Self Care | Payer: Managed Care, Other (non HMO) | Attending: Physician Assistant | Admitting: Physician Assistant

## 2015-05-04 DIAGNOSIS — Z79899 Other long term (current) drug therapy: Secondary | ICD-10-CM | POA: Diagnosis not present

## 2015-05-04 DIAGNOSIS — Z8669 Personal history of other diseases of the nervous system and sense organs: Secondary | ICD-10-CM | POA: Insufficient documentation

## 2015-05-04 DIAGNOSIS — E039 Hypothyroidism, unspecified: Secondary | ICD-10-CM | POA: Diagnosis not present

## 2015-05-04 DIAGNOSIS — H5712 Ocular pain, left eye: Secondary | ICD-10-CM | POA: Diagnosis present

## 2015-05-04 DIAGNOSIS — R51 Headache: Secondary | ICD-10-CM | POA: Diagnosis not present

## 2015-05-04 DIAGNOSIS — K5900 Constipation, unspecified: Secondary | ICD-10-CM | POA: Insufficient documentation

## 2015-05-04 DIAGNOSIS — H109 Unspecified conjunctivitis: Secondary | ICD-10-CM | POA: Diagnosis not present

## 2015-05-04 MED ORDER — TETRACAINE HCL 0.5 % OP SOLN
1.0000 [drp] | Freq: Once | OPHTHALMIC | Status: DC
  Filled 2015-05-04: qty 2

## 2015-05-04 MED ORDER — ERYTHROMYCIN 5 MG/GM OP OINT
TOPICAL_OINTMENT | Freq: Once | OPHTHALMIC | Status: AC
  Administered 2015-05-04: 1 via OPHTHALMIC
  Filled 2015-05-04: qty 3.5

## 2015-05-04 MED ORDER — FLUORESCEIN SODIUM 1 MG OP STRP
1.0000 | ORAL_STRIP | Freq: Once | OPHTHALMIC | Status: DC
Start: 2015-05-04 — End: 2015-05-04
  Filled 2015-05-04: qty 1

## 2015-05-04 NOTE — ED Notes (Signed)
Patient c/o ? FB in left eye. Per patient using eye drops (systane). Per patient blurred vision in eye until she uses eye drops.

## 2015-05-04 NOTE — Discharge Instructions (Signed)
We do not see a foreign body or corneal abrasion of the left eye today. There is a lot of irritation and redness. We are treating you with an antibiotic eye ointment until you can see the eye doctor. Call Dr. Iona Hansen office in the morning and tell them you were seen in the ED and need follow up with him tomorrow if possible. Take ibuprofen as needed for discomfort. Use the eye ointment three times a day.

## 2015-05-04 NOTE — ED Provider Notes (Signed)
CSN: [id number]     Arrival date & time 05/04/15  1444 History  This chart was scribed for non-physician practitioner, [facility] M. Janit Bern, NP working with Macarthur Critchley, MD by Hansel Feinstein, ED scribe. This patient was seen in room APFT24/APFT24 and the patient's care was started at 4:39 PM   Chief Complaint  Patient presents with  . Foreign Body in Eye   Patient is a 54 y.o. female presenting with foreign body in eye. The history is provided by the patient. No language interpreter was used.  Foreign Body in Eye This is a new problem. The current episode started 2 days ago. The problem occurs constantly. The problem has been gradually worsening. Associated symptoms include headaches. Exacerbated by: blinking. Nothing relieves the symptoms. Treatments tried: eye drops. The treatment provided no relief.    HPI Comments: Yvette Joyce is a 54 y.o. female who presents to the Emergency Department complaining of moderate left eye scratching pain onset 2 days ago while washing her face. She states an associated feeling of sand in her eye, generalized HA. She also notes a feeling of dry eyes at baseline. Pt reports increased pain with blinking. She notes no relief with eye drops, rinses or Claritin. She notes that she was outside frequently that day, but does not remember any specific injury. She states a hx of laser eye surgery and has had no issues since. Her normal optometrist is at [facility]. She has also been seen by Dr. Gershon Crane. Pt takes thyroid medication and adderal. No other medical problems. She denies right eye pain, otalgia.   Past Medical History  Diagnosis Date  . Hypothyroidism   . Headache(784.0)   . Constipation   . Complication of anesthesia     "hard to wake up"  . Hematuria 04/12/2013  . Fatigue 03/07/2015  . Sleep disturbance 03/07/2015  . Postmenopause 03/07/2015   Past Surgical History  Procedure Laterality Date  . Fibroids removed from uterus    . Cosmetic eye surgery    .  Colonoscopy  09/24/2011    BMW:[id number] polyps in the ascending colon/diverticula in the sigmoid colon/internal hemorrhoids  . Nasal septum surgery    . Polypectomy  12/03/2011    Procedure: POLYPECTOMY;  Surgeon: Jonnie Kind, MD;  Location: [facility];  Service: Gynecology;  Laterality: N/A;  resection of polyp  . Hysteroscopy w/d&c  12/03/2011    Procedure: DILATATION AND CURETTAGE /HYSTEROSCOPY;  Surgeon: Jonnie Kind, MD;  Location: [facility];  Service: Gynecology;  Laterality: N/A;  . Laparoscopic supracervical hysterectomy  07/11/2012    Procedure: LAPAROSCOPIC SUPRACERVICAL HYSTERECTOMY;  Surgeon: Jonnie Kind, MD;  Location: [facility];  Service: Gynecology;  Laterality: N/A;  . Laparoscopic salpingo oopherectomy  07/11/2012    Procedure: LAPAROSCOPIC SALPINGO OOPHORECTOMY;  Surgeon: Jonnie Kind, MD;  Location: [facility];  Service: Gynecology;  Laterality: Bilateral;  . Abdominal hysterectomy    . Knee arthroscopy w/ debridement     Family History  Problem Relation Age of Onset  . Colon cancer Maternal Uncle   . Cancer Sister 37    breast   . Heart disease Father   . Stroke Mother    Social History  Substance Use Topics  . Smoking status: Never Smoker   . Smokeless tobacco: Never Used  . Alcohol Use: 0.6 oz/week    1 Shots of liquor per week     Comment: 1-2 times a week   OB History    Gravida Para Term  Preterm AB TAB SAB Ectopic Multiple Living            0     Review of Systems  HENT: Negative for ear pain.   Eyes: Positive for pain.  Neurological: Positive for headaches.  All other systems reviewed and are negative.  Allergies  Review of patient's allergies indicates no known allergies.  Home Medications   Prior to Admission medications   Medication Sig Start Date End Date Taking? Authorizing Provider  amphetamine-dextroamphetamine (ADDERALL) 20 MG tablet Take 20 mg by mouth daily.    Historical Provider, MD  Bimatoprost (LATISSE EX) Apply 1 application  topically daily.    Historical Provider, MD  Cholecalciferol (VITAMIN D PO) Take 1 tablet by mouth daily. Unsure of strength    Historical Provider, MD  CYANOCOBALAMIN PO Take 1 tablet by mouth daily. Unsure of strength    Historical Provider, MD  fish oil-omega-3 fatty acids 1000 MG capsule Take 2 g by mouth daily.    Historical Provider, MD  hydrocortisone (ANUSOL-HC) 25 MG suppository Place 1 suppository (25 mg total) rectally every 12 (twelve) hours. Patient not taking: Reported on 03/07/2015 11/13/14   Carlis Stable, NP  ibuprofen (ADVIL,MOTRIN) 200 MG tablet Take 400 mg by mouth every 6 (six) hours as needed. For pain    Historical Provider, MD  levothyroxine (SYNTHROID, LEVOTHROID) 88 MCG tablet Take 1 tablet (88 mcg total) by mouth daily before breakfast. 03/10/15   Estill Dooms, NP  Multiple Vitamin (MULITIVITAMIN WITH MINERALS) TABS Take 1 tablet by mouth daily.    Historical Provider, MD  polyethylene glycol powder (GLYCOLAX/MIRALAX) powder Take 17 g by mouth daily. 07/12/12   Jonnie Kind, MD  Specialty Vitamins Products (MAGNESIUM, AMINO ACID CHELATE,) 133 MG tablet Take 1 tablet by mouth daily.    Historical Provider, MD  traMADol (ULTRAM) 50 MG tablet Take 1 tablet (50 mg total) by mouth every 6 (six) hours as needed. Patient not taking: Reported on 03/07/2015 02/13/14   Tammy Triplett, PA-C   BP 128/76 mmHg  Pulse 78  Temp(Src) 98.6 F (37 C) (Oral)  Resp 16  Ht 5\' 7"  (1.702 m)  Wt 128 lb (58.06 kg)  BMI 20.04 kg/m2  SpO2 100%  LMP 11/25/2011 Physical Exam  Constitutional: She is oriented to person, place, and time. She appears well-developed and well-nourished. No distress.  HENT:  Head: Normocephalic and atraumatic.  Eyes: EOM are normal. Pupils are equal, round, and reactive to light. Lids are everted and swept, no foreign bodies found. Left eye exhibits no discharge. No foreign body present in the left eye. Left conjunctiva is injected. Left eye exhibits normal  extraocular motion and no nystagmus.  Fundoscopic exam:      The left eye shows no hemorrhage and no papilledema.  Slit lamp exam:      The left eye shows no corneal abrasion, no corneal ulcer, no foreign body, no hyphema and no fluorescein uptake.  There is dryness and erythema to the lower left lid. There is one area to the lower lid that is slightly raised and possibly an early stye. The sclera is irritated.   Neck: Normal range of motion. Neck supple.  Cardiovascular: Normal rate.   Pulmonary/Chest: Effort normal. No respiratory distress.  Abdominal: She exhibits no distension.  Musculoskeletal: Normal range of motion.  Neurological: She is alert and oriented to person, place, and time.  Skin: Skin is warm and dry.  Psychiatric: She has a normal mood and affect.  Her behavior is normal.  Nursing note and vitals reviewed.   ED Course  Procedures (including critical care time) Dr. Nolon Nations  Examined the patient's eye with the slit lamp in addition to my exam and agrees with findings.   DIAGNOSTIC STUDIES: Oxygen Saturation is 100% on RA, normal by my interpretation.    COORDINATION OF CARE: 4:46 PM Discussed treatment plan with pt at bedside and pt agreed to plan.   MDM  54 y.o. female with left eye irritation, dryness and feeling of foreign body to the eye. Erythromycin Opth. Ointment with first dose instilled prior to d/c. Stable for d/c to follow up with Dr. Iona Hansen tomorrow. Discussed with the patient and all questioned fully answered. She will return here as needed for any problems. Final diagnoses:  Conjunctivitis of left eye   I personally performed the services described in this documentation, which was scribed in my presence. The recorded information has been reviewed and is accurate.    [facility] Bunnie Pion, NP 05/04/15 [place], MD 05/04/15 2320

## 2015-05-12 ENCOUNTER — Ambulatory Visit (INDEPENDENT_AMBULATORY_CARE_PROVIDER_SITE_OTHER): Payer: Managed Care, Other (non HMO) | Admitting: Neurology

## 2015-05-12 ENCOUNTER — Encounter: Payer: Self-pay | Admitting: Neurology

## 2015-05-12 VITALS — BP 118/78 | HR 70 | Resp 16 | Ht 66.0 in | Wt 136.0 lb

## 2015-05-12 DIAGNOSIS — G478 Other sleep disorders: Secondary | ICD-10-CM

## 2015-05-12 DIAGNOSIS — G4710 Hypersomnia, unspecified: Secondary | ICD-10-CM

## 2015-05-12 DIAGNOSIS — R0683 Snoring: Secondary | ICD-10-CM

## 2015-05-12 NOTE — Progress Notes (Signed)
Subjective:    Patient ID: Yvette Joyce is a 54 y.o. female.  HPI     Star Age, MD, PhD [facility] [address] [place], [place] [address]  Dear Marland Kitchen,  I saw your patient, Yvette Joyce, upon your kind request in my neurologic clinic today for initial consultation of her sleep disorder, in particular, her excessive daytime somnolence and snoring. The patient is unaccompanied today. As you know, Ms. Pyon is a very pleasant 54 year old right-handed woman with an underlying medical history of allergic rhinitis, hypothyroidism and ADHD, who reports snoring and excessive daytime somnolence. She has not felt rested in years, she can fall asleep anytime and fall asleep at the drop of a hat. She does not need to have the TV on at night. She works from 4 PM to 2 AM, M-Th, off on Friday through Sun. She has had this work schedule for 6 years. She works in [employer], running a machine, never had issues performing at works, never fallen asleep at the wheel. She denies episodes of sleep paralysis, hypnagogic or hypnopompic hallucinations, or cataplexy. She feels that she rarely dreams. When she dreams these are not good dreams. She denies restless leg symptoms. She is a restless sleeper. She does not wake up rested. She has had sleepiness during the day for years. She goes to bed around 3 AM but may not fall asleep until 4 or 4:30. Rise time is between 10 AM and noon. On Fridays and Saturdays as well as Sundays she goes to bed around 11 PM. She does not have a family history of obstructive sleep apnea. She has allergy symptoms. She had nasal surgery years ago for deviated septum. She denies morning headaches. She has nocturia, typically once or twice per night. Her Epworth sleepiness score is 14 out of 24 today, her fatigue scores 46 out of 63. She does not smoke. She drinks alcohol occasionally. She drinks caffeine in the form of 5 hour energy drinks  once a day. She has been on Adderall XR 20 mg once daily for about a year. It has helped her concentration but not necessarily her sleepiness. She reports memory issues. She feels that she is becoming forgetful. She has a family history of heart disease and stroke. She lives alone. Her boyfriend has commented on her snoring that has not told her that she has pauses in her breathing. She has woken herself up with a sense of gagging at times. I reviewed your office note from 03/21/15, which you kindly included. She had blood work in July through her [place], and her synthroid was increased.   Her Past Medical History Is Significant For: Past Medical History  Diagnosis Date  . Hypothyroidism   . Headache(784.0)   . Constipation   . Complication of anesthesia     "hard to wake up"  . Hematuria 04/12/2013  . Fatigue 03/07/2015  . Sleep disturbance 03/07/2015  . Postmenopause 03/07/2015  . Snoring   . Adult attention deficit disorder     Her Past Surgical History Is Significant For: Past Surgical History  Procedure Laterality Date  . Fibroids removed from uterus    . Cosmetic eye surgery    . Colonoscopy  09/24/2011    TGG:YIRSWNI polyps in the ascending colon/diverticula in the sigmoid colon/internal hemorrhoids  . Nasal septum surgery    . Polypectomy  12/03/2011    Procedure: POLYPECTOMY;  Surgeon: Jonnie Kind, MD;  Location: [facility];  Service: Gynecology;  Laterality: N/A;  resection of polyp  . Hysteroscopy w/d&c  12/03/2011    Procedure: DILATATION AND CURETTAGE /HYSTEROSCOPY;  Surgeon: Jonnie Kind, MD;  Location: [facility];  Service: Gynecology;  Laterality: N/A;  . Laparoscopic supracervical hysterectomy  07/11/2012    Procedure: LAPAROSCOPIC SUPRACERVICAL HYSTERECTOMY;  Surgeon: Jonnie Kind, MD;  Location: [facility];  Service: Gynecology;  Laterality: N/A;  . Laparoscopic salpingo oopherectomy  07/11/2012    Procedure: LAPAROSCOPIC SALPINGO OOPHORECTOMY;  Surgeon: Jonnie Kind, MD;   Location: [facility];  Service: Gynecology;  Laterality: Bilateral;  . Abdominal hysterectomy    . Knee arthroscopy w/ debridement    . Lasik      Her Family History Is Significant For: Family History  Problem Relation Age of Onset  . Colon cancer Maternal Uncle   . Cancer Sister 2    breast   . Heart disease Father   . Stroke Mother     Her Social History Is Significant For: Social History   Social History  . Marital Status: Divorced    Spouse Name: N/A  . Number of Children: 0  . Years of Education: College   Social History Main Topics  . Smoking status: Never Smoker   . Smokeless tobacco: Never Used  . Alcohol Use: 0.6 oz/week    1 Shots of liquor per week     Comment: 1-2 times a week  . Drug Use: No  . Sexual Activity: Yes    Birth Control/ Protection: Surgical   Other Topics Concern  . None   Social History Narrative   Drinks about 5- 5 Hour [employer] a week     Her Allergies Are:  No Known Allergies:   Her Current Medications Are:  Outpatient Encounter Prescriptions as of 05/12/2015  Medication Sig  . ADDERALL XR 20 MG 24 hr capsule   . Cholecalciferol (VITAMIN D PO) Take 1 tablet by mouth daily. Unsure of strength  . fish oil-omega-3 fatty acids 1000 MG capsule Take 2 g by mouth daily.  Marland Kitchen levothyroxine (SYNTHROID, LEVOTHROID) 75 MCG tablet   . Multiple Vitamin (MULITIVITAMIN WITH MINERALS) TABS Take 1 tablet by mouth daily.  . polyethylene glycol powder (GLYCOLAX/MIRALAX) powder Take 17 g by mouth daily.  . [DISCONTINUED] Bimatoprost (LATISSE EX) Apply 1 application topically daily.  . [DISCONTINUED] CYANOCOBALAMIN PO Take 1 tablet by mouth daily. Unsure of strength  . [DISCONTINUED] hydrocortisone (ANUSOL-HC) 25 MG suppository Place 1 suppository (25 mg total) rectally every 12 (twelve) hours. (Patient not taking: Reported on 03/07/2015)  . [DISCONTINUED] ibuprofen (ADVIL,MOTRIN) 200 MG tablet Take 400 mg by mouth every 6 (six) hours as needed. For pain  .  [DISCONTINUED] levothyroxine (SYNTHROID, LEVOTHROID) 88 MCG tablet   . [DISCONTINUED] Specialty Vitamins Products (MAGNESIUM, AMINO ACID CHELATE,) 133 MG tablet Take 1 tablet by mouth daily.  . [DISCONTINUED] traMADol (ULTRAM) 50 MG tablet Take 1 tablet (50 mg total) by mouth every 6 (six) hours as needed. (Patient not taking: Reported on 03/07/2015)   No facility-administered encounter medications on file as of 05/12/2015.  :  Review of Systems:  Out of a complete 14 point review of systems, all are reviewed and negative with the exception of these symptoms as listed below:  Review of Systems  Constitutional: Positive for fatigue.  Eyes: Positive for pain.       Blurred vision  Gastrointestinal: Positive for constipation.  Endocrine:       Feeling hot  Allergic/Immunologic: Positive for environmental  allergies.  Neurological: Positive for dizziness and weakness.       Memory loss, sleepiness,   No trouble falling asleep or staying asleep, falls asleep when sitting still, moves a lot during sleep, snoring, wakes up choking or gasping for air, wakes up feeling tired in the morning, daytime tiredness, sometimes takes a nap before work, works 2nd shift.   Hematological: Bruises/bleeds easily.  Psychiatric/Behavioral:       Not enough sleep, too much sleep, decreased energy, disinterest in activities     Objective:  Neurologic Exam  Physical Exam Physical Examination:   Filed Vitals:   05/12/15 0904  BP: 118/78  Pulse: 70  Resp: 16   General Examination: The patient is a very pleasant 54 y.o. female in no acute distress. She appears well-developed and well-nourished and very well groomed.   HEENT: Normocephalic, atraumatic, pupils are equal, round and reactive to light and accommodation. Funduscopic exam is normal with sharp disc margins noted. Extraocular tracking is good without limitation to gaze excursion or nystagmus noted. Normal smooth pursuit is noted. Hearing is grossly  intact. Tympanic membranes are clear bilaterally. Face is symmetric with normal facial animation and normal facial sensation. Speech is clear with no dysarthria noted. There is no hypophonia. There is no lip, neck/head, jaw or voice tremor. Neck is supple with full range of passive and active motion. There are no carotid bruits on auscultation. Oropharynx exam reveals: mild mouth dryness, good dental hygiene and mild airway crowding, due to narrow airway entry. Mallampati is class II. Tongue protrudes centrally and palate elevates symmetrically. Tonsils are small or absent (?). Neck size is 12.5 inches. She has a Mild overbite. Nasal inspection reveals no significant nasal mucosal bogginess or redness and no septal deviation.   Chest: Clear to auscultation without wheezing, rhonchi or crackles noted.  Heart: S1+S2+0, regular and normal without murmurs, rubs or gallops noted.   Abdomen: Soft, non-tender and non-distended with normal bowel sounds appreciated on auscultation.  Extremities: There is no pitting edema in the distal lower extremities bilaterally. Pedal pulses are intact.  Skin: Warm and dry without trophic changes noted. There are no varicose veins.  Musculoskeletal: exam reveals no obvious joint deformities, tenderness or joint swelling or erythema.   Neurologically:  Mental status: The patient is awake, alert and oriented in all 4 spheres. Her immediate and remote memory, attention, language skills and fund of knowledge are appropriate. There is no evidence of aphasia, agnosia, apraxia or anomia. Speech is clear with normal prosody and enunciation. Thought process is linear. Mood is normal and affect is normal.  Cranial nerves II - XII are as described above under HEENT exam. In addition: shoulder shrug is normal with equal shoulder height noted. Motor exam: Normal bulk, strength and tone is noted. There is no drift, tremor or rebound. Romberg is negative. Reflexes are 2+ throughout.  Babinski: Toes are flexor bilaterally. Fine motor skills and coordination: intact with normal finger taps, normal hand movements, normal rapid alternating patting, normal foot taps and normal foot agility.  Cerebellar testing: No dysmetria or intention tremor on finger to nose testing. Heel to shin is unremarkable bilaterally. There is no truncal or gait ataxia.  Sensory exam: intact to light touch, pinprick, vibration, temperature sense in the upper and lower extremities.  Gait, station and balance: She stands easily. No veering to one side is noted. No leaning to one side is noted. Posture is age-appropriate and stance is narrow based. Gait shows normal stride length and  normal pace. No problems turning are noted. She turns en bloc. Tandem walk is unremarkable.   Assessment and Plan:   In summary, RAJANEE SCHUELKE is a very pleasant 54 y.o.-year old female with an underlying medical history of allergic rhinitis, hypothyroidism and ADHD, who reports snoring and excessive daytime somnolence. Her history and physical exam are not telltale for obstructive sleep apnea. She has a slender built and neck size. She does endorse snoring and occasionally waking up with a sense of gasping or gagging. Nevertheless, her airway exam does not suggest a crowded airway but she does have a narrow airway. Her excessive daytime somnolence seems to stand out of proportion. She does not give a telltale history for narcolepsy, nevertheless, she does endorse significant somnolence despite taking a stimulant medication at this time. I would like to bring her back for sleep study testing as well as nap testing for concern for hypersomnolence disorder. She is advised to not increase her caffeine intake and be off of her stimulant medication about 2 weeks prior to sleep testing. She voiced understanding and agreement. Her physical exam is nonfocal. She has cognitive complaints but is able to give a very concise history. At this juncture,  we will do a nocturnal polysomnogram followed by a nap study called MSLT, and explained sleep study testing to her. Of note, her twin sister was advised to have a sleep study in the past but could not go through with it. I will see her back after his sleep studies are completed. Thank you very much for allowing me to participate in the care of this nice patient. If I can be of any further assistance to you please do not hesitate to call me at [phone].  Sincerely,   Star Age, MD, PhD

## 2015-05-12 NOTE — Patient Instructions (Addendum)
We will do sleep testing for your sleepiness with a night time sleep testing and daytime sleep testing.  I am not convinced you have sleep apnea, but we will look.  Do not increase your caffeine intake for about 2 weeks before sleep testing and you will need to be off of your stimulant medicine, Adderall XR 20 mg, 2 weeks before sleep testing.

## 2015-06-07 ENCOUNTER — Other Ambulatory Visit: Payer: Self-pay | Admitting: Adult Health

## 2015-06-08 ENCOUNTER — Ambulatory Visit (INDEPENDENT_AMBULATORY_CARE_PROVIDER_SITE_OTHER): Payer: Managed Care, Other (non HMO) | Admitting: Neurology

## 2015-06-08 DIAGNOSIS — G4720 Circadian rhythm sleep disorder, unspecified type: Secondary | ICD-10-CM

## 2015-06-08 DIAGNOSIS — G4761 Periodic limb movement disorder: Secondary | ICD-10-CM

## 2015-06-08 DIAGNOSIS — G4710 Hypersomnia, unspecified: Secondary | ICD-10-CM

## 2015-06-08 DIAGNOSIS — G479 Sleep disorder, unspecified: Secondary | ICD-10-CM

## 2015-06-09 ENCOUNTER — Encounter (INDEPENDENT_AMBULATORY_CARE_PROVIDER_SITE_OTHER): Payer: Managed Care, Other (non HMO)

## 2015-06-09 DIAGNOSIS — G4710 Hypersomnia, unspecified: Secondary | ICD-10-CM

## 2015-06-09 DIAGNOSIS — R0683 Snoring: Secondary | ICD-10-CM

## 2015-06-09 DIAGNOSIS — G47429 Narcolepsy in conditions classified elsewhere without cataplexy: Secondary | ICD-10-CM | POA: Diagnosis not present

## 2015-06-09 DIAGNOSIS — G478 Other sleep disorders: Secondary | ICD-10-CM

## 2015-06-09 NOTE — Sleep Study (Signed)
Please see the scanned sleep study interpretation located in the Procedure tab within the Chart Review section. 

## 2015-06-13 ENCOUNTER — Telehealth: Payer: Self-pay | Admitting: Neurology

## 2015-06-13 NOTE — Telephone Encounter (Signed)
Patient referred by Dr. Gerarda Fraction, seen by me on 05/12/15, diagnostic PSG and MSLT on 10/23 and 09/08/14:   Please call and notify the patient that the recent sleep studies did not show any significant obstructive sleep apnea and overall mild sleepiness, not concerning for narcolepsy. Please inform patient that I would like to go over the details of the study during a follow up appointment. Arrange a followup appointment. Also, route or fax report to PCP and referring MD, if other than PCP.  Once you have spoken to patient, you can close this encounter.   Thanks,  Star Age, MD, PhD [facility])

## 2015-06-18 NOTE — Telephone Encounter (Signed)
I spoke to patient and gave results of sleep studies. We were able to make f/u appt for Monday. I will fax report to PCP.

## 2015-06-23 ENCOUNTER — Ambulatory Visit (INDEPENDENT_AMBULATORY_CARE_PROVIDER_SITE_OTHER): Payer: Managed Care, Other (non HMO) | Admitting: Neurology

## 2015-06-23 ENCOUNTER — Encounter: Payer: Self-pay | Admitting: Neurology

## 2015-06-23 VITALS — BP 124/82 | HR 80 | Resp 16 | Ht 66.0 in | Wt 139.0 lb

## 2015-06-23 DIAGNOSIS — G4726 Circadian rhythm sleep disorder, shift work type: Secondary | ICD-10-CM | POA: Diagnosis not present

## 2015-06-23 DIAGNOSIS — G4710 Hypersomnia, unspecified: Secondary | ICD-10-CM

## 2015-06-23 MED ORDER — ARMODAFINIL 150 MG PO TABS: 150.0000 mg | ORAL_TABLET | Freq: Every day | ORAL | Status: DC

## 2015-06-23 NOTE — Progress Notes (Signed)
Subjective:    Patient ID: Yvette Joyce is a 54 y.o. female.  HPI     Interim history:   Yvette Joyce is a very pleasant 54 year old right-handed woman with an underlying medical history of allergic rhinitis, hypothyroidism and ADHD, who presents for follow-up consultation of her sleep disorder, in particular her complaint of daytime somnolence, after her recent overnight sleep study and next a nap study. The patient is unaccompanied today. I first met her on 05/12/2015 at the request of her primary care provider, at which time she reported snoring, excessive daytime somnolence, nonrestorative sleep and shift work for 6 years. I invited her back for sleep study at night, followed by a daytime MSLT. She had a baseline sleep study on 06/08/2015 and a next a nap study on 06/09/2015 and I went over her test results with her in detail today. Her nighttime sleep study showed a sleep efficiency of reduced at 77.7% with a normal sleep latency of 16.5 minutes and wake after sleep onset of 88.5 minutes with mild to moderate sleep fragmentation noted. The arousal index was 13.8 per hour, primarily secondary to spontaneous arousals. She had an increased percentage of stage I and stage II sleep at 14.1 and 73.5% respectively, absence of slow-wave sleep and a decreased percentage of REM sleep at 12.4% with a prolonged REM latency of 164 minutes. She had mild PLMS with an index of 6.2 per hour, resulting in 1.5 arousals per hour. She had no significant EKG changes. She had mild intermittent snoring. Total AHI was 1 per hour, average oxygen saturation was 95%, nadir was 90%. She had a next a MSLT on 06/09/2015 which showed a mean sleep latency of 12.5 minutes indicating mild sleepiness. REM sleep was noted during nap #1 and 2. I felt that her studies did not support the diagnosis of narcolepsy or idiopathic hypersomnolence based on a mean sleep latency of over 8 minutes, the fact that she had decreased REM sleep and delay  and REM sleep during the nighttime study and the fact that she had REM sleep during nap #1 and 2 only, would support a circadian rhythm disorder such as delayed sleep phase syndrome, and could reflect shift work disorder based on her work schedule and sleep schedule typically.   Today, 06/23/2015: She reports fairly unchanged symptoms of daytime tiredness. She works second shift and has done so for a long time, including third shift. She has done 2nd shift for about 6 years. Her mother had trouble with sleep maintenance. She uses energy drinks. She had vertigo in the past. She had a neg. [facility] in 2006 after a fall.   Previously:  05/12/2015: She reports snoring and excessive daytime somnolence. She has not felt rested in years, she can fall asleep anytime and fall asleep at the drop of a hat. She does not need to have the TV on at night. She works from 4 PM to 2 AM, M-Th, off on Friday through Sun. She has had this work schedule for 6 years. She works in [employer], running a machine, never had issues performing at works, never fallen asleep at the wheel. She denies episodes of sleep paralysis, hypnagogic or hypnopompic hallucinations, or cataplexy. She feels that she rarely dreams. When she dreams these are not good dreams. She denies restless leg symptoms. She is a restless sleeper. She does not wake up rested. She has had sleepiness during the day for years. She goes to bed around 3 AM but may  not fall asleep until 4 or 4:30. Rise time is between 10 AM and noon. On Fridays and Saturdays as well as Sundays she goes to bed around 11 PM. She does not have a family history of obstructive sleep apnea. She has allergy symptoms. She had nasal surgery years ago for deviated septum. She denies morning headaches. She has nocturia, typically once or twice per night. Her Epworth sleepiness score is 14 out of 24 today, her fatigue scores 46 out of 63. She does not smoke. She drinks alcohol occasionally. She  drinks caffeine in the form of 5 hour energy drinks once a day. She has been on Adderall XR 20 mg once daily for about a year. It has helped her concentration but not necessarily her sleepiness. She reports memory issues. She feels that she is becoming forgetful. She has a family history of heart disease and stroke. She lives alone. Her boyfriend has commented on her snoring that has not told her that she has pauses in her breathing. She has woken herself up with a sense of gagging at times. I reviewed your office note from 03/21/15, which you kindly included. She had blood work in July through her [place], and her synthroid was increased.    Her Past Medical History Is Significant For: Past Medical History  Diagnosis Date  . Hypothyroidism   . Headache(784.0)   . Constipation   . Complication of anesthesia     "hard to wake up"  . Hematuria 04/12/2013  . Fatigue 03/07/2015  . Sleep disturbance 03/07/2015  . Postmenopause 03/07/2015  . Snoring   . Adult attention deficit disorder     Her Past Surgical History Is Significant For: Past Surgical History  Procedure Laterality Date  . Fibroids removed from uterus    . Cosmetic eye surgery    . Colonoscopy  09/24/2011    YTK:PTWSFKC polyps in the ascending colon/diverticula in the sigmoid colon/internal hemorrhoids  . Nasal septum surgery    . Polypectomy  12/03/2011    Procedure: POLYPECTOMY;  Surgeon: Jonnie Kind, MD;  Location: [facility];  Service: Gynecology;  Laterality: N/A;  resection of polyp  . Hysteroscopy w/d&c  12/03/2011    Procedure: DILATATION AND CURETTAGE /HYSTEROSCOPY;  Surgeon: Jonnie Kind, MD;  Location: [facility];  Service: Gynecology;  Laterality: N/A;  . Laparoscopic supracervical hysterectomy  07/11/2012    Procedure: LAPAROSCOPIC SUPRACERVICAL HYSTERECTOMY;  Surgeon: Jonnie Kind, MD;  Location: [facility];  Service: Gynecology;  Laterality: N/A;  . Laparoscopic salpingo oopherectomy  07/11/2012    Procedure: LAPAROSCOPIC  SALPINGO OOPHORECTOMY;  Surgeon: Jonnie Kind, MD;  Location: [facility];  Service: Gynecology;  Laterality: Bilateral;  . Abdominal hysterectomy    . Knee arthroscopy w/ debridement    . Lasik      Her Family History Is Significant For: Family History  Problem Relation Age of Onset  . Colon cancer Maternal Uncle   . Cancer Sister 29    breast   . Heart disease Father   . Stroke Mother     Her Social History Is Significant For: Social History   Social History  . Marital Status: Divorced    Spouse Name: N/A  . Number of Children: 0  . Years of Education: College   Social History Main Topics  . Smoking status: Never Smoker   . Smokeless tobacco: Never Used  . Alcohol Use: 0.6 oz/week    1 Shots of liquor per week     Comment:  1-2 times a week  . Drug Use: No  . Sexual Activity: Yes    Birth Control/ Protection: Surgical   Other Topics Concern  . None   Social History Narrative   Drinks about 5- 5 Hour [employer] a week     Her Allergies Are:  No Known Allergies:   Her Current Medications Are:  Outpatient Encounter Prescriptions as of 06/23/2015  Medication Sig  . ADDERALL XR 20 MG 24 hr capsule   . Cholecalciferol (VITAMIN D PO) Take 1 tablet by mouth daily. Unsure of strength  . fish oil-omega-3 fatty acids 1000 MG capsule Take 2 g by mouth daily.  Marland Kitchen levothyroxine (SYNTHROID, LEVOTHROID) 88 MCG tablet TAKE 1 TABLET BY MOUTH ONCE DAILY FOR THYROID.  . Multiple Vitamin (MULITIVITAMIN WITH MINERALS) TABS Take 1 tablet by mouth daily.  . polyethylene glycol powder (GLYCOLAX/MIRALAX) powder Take 17 g by mouth daily.  Marland Kitchen terbinafine (LAMISIL) 250 MG tablet    No facility-administered encounter medications on file as of 06/23/2015.  :  Review of Systems:  Out of a complete 14 point review of systems, all are reviewed and negative with the exception of these symptoms as listed below:   Review of Systems  Neurological:       Patient is here to discuss her daytime  sleepiness and results of sleep study     Objective:  Neurologic Exam  Physical Exam Physical Examination:   Filed Vitals:   06/23/15 1008  BP: 124/82  Pulse: 80  Resp: 16   General Examination: The patient is a very pleasant 54 y.o. female in no acute distress. She appears well-developed and well-nourished and very well groomed. She is in good spirits.    HEENT: Normocephalic, atraumatic, pupils are equal, round and reactive to light and accommodation. Extraocular tracking is good without limitation to gaze excursion or nystagmus noted. Normal smooth pursuit is noted. Hearing is grossly intact. Face is symmetric with normal facial animation and normal facial sensation. Speech is clear with no dysarthria noted. There is no hypophonia. There is no lip, neck/head, jaw or voice tremor. Neck is supple with full range of passive and active motion. There are no carotid bruits on auscultation. Oropharynx exam reveals: mild mouth dryness, good dental hygiene and mild airway crowding, due to narrow airway entry. Mallampati is class II. Tongue protrudes centrally and palate elevates symmetrically. Tonsils are small or absent (?).   Chest: Clear to auscultation without wheezing, rhonchi or crackles noted.  Heart: S1+S2+0, regular and normal without murmurs, rubs or gallops noted.   Abdomen: Soft, non-tender and non-distended with normal bowel sounds appreciated on auscultation.  Extremities: There is no pitting edema in the distal lower extremities bilaterally. Pedal pulses are intact.  Skin: Warm and dry without trophic changes noted. There are no varicose veins.  Musculoskeletal: exam reveals no obvious joint deformities, tenderness or joint swelling or erythema.   Neurologically:  Mental status: The patient is awake, alert and oriented in all 4 spheres. Her immediate and remote memory, attention, language skills and fund of knowledge are appropriate. There is no evidence of aphasia, agnosia,  apraxia or anomia. Speech is clear with normal prosody and enunciation. Thought process is linear. Mood is normal and affect is normal.  Cranial nerves II - XII are as described above under HEENT exam. In addition: shoulder shrug is normal with equal shoulder height noted. Motor exam: Normal bulk, strength and tone is noted. There is no drift, tremor or rebound. Romberg is negative.  Reflexes are 2+ throughout. Fine motor skills and coordination: intact with normal finger taps, normal hand movements, normal rapid alternating patting, normal foot taps and normal foot agility.  Cerebellar testing: No dysmetria or intention tremor on finger to nose testing. Heel to shin is unremarkable bilaterally. There is no truncal or gait ataxia.  Sensory exam: intact to light touch, pinprick, vibration, temperature sense in the upper and lower extremities.  Gait, station and balance: She stands easily. No veering to one side is noted. No leaning to one side is noted. Posture is age-appropriate and stance is narrow based. Gait shows normal stride length and normal pace. No problems turning are noted. She turns en bloc. Tandem walk is unremarkable.   Assessment and Plan:   In summary, TISH BEGIN is a very pleasant 54 year old female with an underlying medical history of allergic rhinitis, hypothyroidism and ADHD, who presents for follow-up consultation of her sleep disturbance, after her recent baseline sleep study followed by a nap study. I went over her test results with her in detail today. Her nighttime sleep study showed a decreased sleep efficiency and sleep fragmentation, primarily secondary to spontaneous arousals. No significant intrinsic sleep disorder was seen. She had a decreased percentage of REM sleep and a prolonged REM latency. She had an increased percentage of light stage sleep. She had no significant sleep disordered breathing. She had mild PLMS at 6.2 per hour but no significant arousals from this. She  denies any Her nap study showed a mean sleep latency of 12.1 minutes, which is not in keeping with a significant hypersomnolence disorder such as narcolepsy or idiopathic hypersomnolence. She did have 2 REM onset naps out of 4 but these occurred in napreports #1 and 2. She seems to have a milder degree of sleepiness,  which could be secondary to shift work disorder. She does not give a telltale history for narcolepsy, nevertheless, she does endorse significant somnolence despite taking a stimulant medication at this time. I would like to try her on Nuvigil starting with 150 mg strength, once daily before she goes to work. In addition, I would like for her to try bright light when she first wakes up and melatonin when she goes to sleep. She was advised about potential side effects with the Nuvigil and advised to not combine it with energy drinks as she can potentiate side effects such as anxiety, jitteriness, tremors and palpitations. I provided her with written instructions and a new prescription for Nuvigil. I would like to see her back in 3 months, sooner if needed. Her exam is stable and she was reassured that she does not have any obstructive sleep apnea.   I answered all her questions today and she was in agreement. She was advised to call or email with any interim questions or concerns.  I spent 25 minutes in total face-to-face time with the patient, more than 50% of which was spent in counseling and coordination of care, reviewing test results, reviewing medication and discussing or reviewing the diagnosis of hypersomnolence and shift work disorder, the prognosis and treatment options.

## 2015-06-23 NOTE — Patient Instructions (Addendum)
Try Melatonin 3 to 5 mg 1 hour before your projected bedtime to help you sleep and exposure yourself to bright light for about 20-30 minutes when you first wake up. Look online for a treatment bright light; they make desk lamps that work as treatment lights as well. I do think that your shift work schedule has direct implications on your sleep disorder.   Let's try Nuvigil, 150 mg strength: take 1 pill once daily before going to work. Side effects include (but are not limited to): high blood pressure, headache, nervousness, palpitations, GI upset, tremor and insomnia.

## 2015-07-08 ENCOUNTER — Telehealth: Payer: Self-pay | Admitting: Adult Health

## 2015-07-08 LAB — TSH: TSH: 4.32 u[IU]/mL (ref 0.450–4.500)

## 2015-07-08 MED ORDER — LEVOTHYROXINE SODIUM 100 MCG PO TABS: 100.0000 ug | ORAL_TABLET | Freq: Every day | ORAL | Status: DC

## 2015-07-08 NOTE — Telephone Encounter (Signed)
Pt aware of labs,still tired will increase dose to 100 mcg and recheck labs in 2 months

## 2015-07-15 ENCOUNTER — Telehealth: Payer: Self-pay | Admitting: Neurology

## 2015-07-15 DIAGNOSIS — G4726 Circadian rhythm sleep disorder, shift work type: Secondary | ICD-10-CM

## 2015-07-15 DIAGNOSIS — G4710 Hypersomnia, unspecified: Secondary | ICD-10-CM

## 2015-07-15 NOTE — Telephone Encounter (Signed)
Left message to call back  

## 2015-07-15 NOTE — Telephone Encounter (Signed)
I would like to add low dose generic ritalin 5 mg to her regimen, rather than increasing the Nuvigil at this time. Pls check if okay with patient and we will put in Rx, she has to pick up though.

## 2015-07-15 NOTE — Telephone Encounter (Signed)
Pt called sts Armodafinil (NUVIGIL) 150 MG tablet is helping, she does not feel like she is in a fog and not jittery. But after 6-7 hrs it wears off and she is inquiring if dose could be increased some. She works from [employer] to [employer]. Can leave message on her phone

## 2015-07-16 MED ORDER — ARMODAFINIL 250 MG PO TABS: 250.0000 mg | ORAL_TABLET | Freq: Every day | ORAL | Status: DC

## 2015-07-16 NOTE — Telephone Encounter (Signed)
Patient is returning a call. °

## 2015-07-16 NOTE — Telephone Encounter (Signed)
Let's keep the Adderall XR the same, increase the Nuvigil to 250 mg once daily. Rx done, pls notify patient.

## 2015-07-16 NOTE — Telephone Encounter (Signed)
I left a message for the patient letting her know the advice below. I faxed into [place].

## 2015-07-16 NOTE — Telephone Encounter (Signed)
I spoke to patient and clarified medication regimen. She states that she wakes up at 11am, takes her nuvigil, takes her adderall er at 2-3pm and then goes to work at [employer]. She thinks that the [place] helps just seems to "fizzle out" on her. What do you recommend?

## 2015-07-18 NOTE — Telephone Encounter (Addendum)
Pt called sts she called [facility] and does not have RX for Nuvigil 250mg . Please call pt and advise at [phone], if she doesn't answer can leave VM

## 2015-07-18 NOTE — Telephone Encounter (Signed)
I called the pharmacy and spoke with Ovid Curd.  He verified they did receive the Rx.  Says they saved it to file instead of filling it in error.  They will proceed with order today.  I called back and spoke with patient.  Relayed this info.  She expressed understanding and appreciation.

## 2015-07-22 ENCOUNTER — Telehealth: Payer: Self-pay | Admitting: *Deleted

## 2015-07-22 NOTE — Telephone Encounter (Signed)
Left message x 1. JSY 

## 2015-07-24 ENCOUNTER — Telehealth: Payer: Self-pay | Admitting: Adult Health

## 2015-07-24 NOTE — Telephone Encounter (Signed)
Spoke with pt. Pt is not due to have bloodwork until after 09/06/15. Pt to call closer to when labs are due. [place]

## 2015-07-24 NOTE — Telephone Encounter (Signed)
Spoke with pt. Pt is not due to have bloodwork until after 09/06/15. Pt to call closer to when it's time for labs. [place]

## 2015-07-24 NOTE — Telephone Encounter (Signed)
Left message x 2. JSY 

## 2015-08-14 ENCOUNTER — Other Ambulatory Visit: Payer: Self-pay

## 2015-08-14 DIAGNOSIS — Z1231 Encounter for screening mammogram for malignant neoplasm of breast: Secondary | ICD-10-CM

## 2015-09-22 ENCOUNTER — Encounter: Payer: Self-pay | Admitting: Neurology

## 2015-09-22 ENCOUNTER — Ambulatory Visit (INDEPENDENT_AMBULATORY_CARE_PROVIDER_SITE_OTHER): Payer: 59 | Admitting: Neurology

## 2015-09-22 VITALS — BP 128/72 | HR 70 | Resp 16 | Ht 66.0 in | Wt 138.0 lb

## 2015-09-22 DIAGNOSIS — G4710 Hypersomnia, unspecified: Secondary | ICD-10-CM

## 2015-09-22 DIAGNOSIS — G4726 Circadian rhythm sleep disorder, shift work type: Secondary | ICD-10-CM

## 2015-09-22 MED ORDER — ARMODAFINIL 250 MG PO TABS: 250.0000 mg | ORAL_TABLET | Freq: Every day | ORAL | Status: DC

## 2015-09-22 NOTE — Patient Instructions (Signed)
Try to phase out of Adderall if you can.  We will keep you on Nuvigil 250 mg once daily.  I am glad you are feeling better!

## 2015-09-22 NOTE — Progress Notes (Signed)
Subjective:    Patient ID: Yvette Joyce is a 55 y.o. female.  HPI     Interim history:  Yvette Joyce is a very pleasant 55 year old right-handed woman with an underlying medical history of allergic rhinitis, hypothyroidism and ADHD, who presents for follow-up consultation of Yvette Joyce sleep disorder, in particular Yvette Joyce complaint of daytime somnolence, after Yvette Joyce recent overnight sleep study and next a nap study. The patient is unaccompanied today. I last saw Yvette Joyce on 06/23/2015, at which time we talked about Yvette Joyce PSG and MSLT from 06/08/2015 as well as 06/09/2015, respectively. Yvette Joyce had no significant sleep disordered breathing. Yvette Joyce had mild PLMS during the nighttime sleep study without significant arousals. Yvette Joyce had no significant RLS symptoms. Yvette Joyce nap study showed a mean sleep latency of 12.1 minutes as well as to REM onset naps out of 4 naps, in nap #1 and 2. Yvette Joyce gave no telltale history of narcolepsy and Yvette Joyce nighttime sleep study showed a decreased percentage of REM sleep in a prolonged REM latency as well as an increased percentage of light stage sleep. I felt that in light of Yvette Joyce shift work disorder this was most likely a circadian rhythm disorder of his shift work type. Yvette Joyce was advised to try Nuvigil 150 mg strength once daily before going to work. Yvette Joyce was also advised to try to bright light therapy when Yvette Joyce first wakes up and melatonin which he goes to bed. On 07/15/15 Yvette Joyce called and reported residual sleepiness. We increased Yvette Joyce Nuvigil to 250 mg once daily.    Today, 09/22/2015: Yvette Joyce reports that the increase in Nuvigil made all the difference. Yvette Joyce feels much improved. Yvette Joyce is much more alert at work and has no side effects with it. In fact, Yvette Joyce stopped using the 5 hour energy drinks that Yvette Joyce has been using before. Yvette Joyce also feels that Yvette Joyce can do without the Adderall. Yvette Joyce is in fact reducing it to every other day at this point.  Yvette Joyce uses melatonin occasionally. Yvette Joyce had recent blood work with Yvette Joyce primary care  provider, Collene Mares, [place]. It showed normal results, with the exception of borderline cholesterol values per Yvette Joyce report.   Previously:  I first met Yvette Joyce on 05/12/2015 at the request of Yvette Joyce primary care provider, at which time Yvette Joyce reported snoring, excessive daytime somnolence, nonrestorative sleep and shift work for 6 years. I invited Yvette Joyce back for sleep study at night, followed by a daytime MSLT. Yvette Joyce had a baseline sleep study on 06/08/2015 and a next a nap study on 06/09/2015 and I went over Yvette Joyce test results with Yvette Joyce in detail today. Yvette Joyce nighttime sleep study showed a sleep efficiency of reduced at 77.7% with a normal sleep latency of 16.5 minutes and wake after sleep onset of 88.5 minutes with mild to moderate sleep fragmentation noted. The arousal index was 13.8 per hour, primarily secondary to spontaneous arousals. Yvette Joyce had an increased percentage of stage I and stage II sleep at 14.1 and 73.5% respectively, absence of slow-wave sleep and a decreased percentage of REM sleep at 12.4% with a prolonged REM latency of 164 minutes. Yvette Joyce had mild PLMS with an index of 6.2 per hour, resulting in 1.5 arousals per hour. Yvette Joyce had no significant EKG changes. Yvette Joyce had mild intermittent snoring. Total AHI was 1 per hour, average oxygen saturation was 95%, nadir was 90%. Yvette Joyce had a next a MSLT on 06/09/2015 which showed a mean sleep latency of 12.5 minutes indicating mild sleepiness. REM sleep was noted during nap #1 and 2.  I felt that Yvette Joyce studies did not support the diagnosis of narcolepsy or idiopathic hypersomnolence based on a mean sleep latency of over 8 minutes, the fact that Yvette Joyce had decreased REM sleep and delay and REM sleep during the nighttime study and the fact that Yvette Joyce had REM sleep during nap #1 and 2 only, would support a circadian rhythm disorder such as delayed sleep phase syndrome, and could reflect shift work disorder based on Yvette Joyce work schedule and sleep schedule typically.   05/12/2015: Yvette Joyce reports  snoring and excessive daytime somnolence. Yvette Joyce has not felt rested in years, Yvette Joyce can fall asleep anytime and fall asleep at the drop of a hat. Yvette Joyce does not need to have the TV on at night. Yvette Joyce works from 4 PM to 2 AM, M-Th, off on Friday through Sun. Yvette Joyce has had this work schedule for 6 years. 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 Yvette Joyce denies episodes of sleep paralysis, hypnagogic or hypnopompic hallucinations, or cataplexy. Yvette Joyce feels that Yvette Joyce rarely dreams. When Yvette Joyce dreams these are not good dreams. Yvette Joyce denies restless leg symptoms. Yvette Joyce is a restless sleeper. Yvette Joyce does not wake up rested. Yvette Joyce has had sleepiness during the day for years. Yvette Joyce goes to bed around 3 AM but may not fall asleep until 4 or 4:30. Rise time is between 10 AM and noon. On Fridays and Saturdays as well as Sundays Yvette Joyce goes to bed around 11 PM. Yvette Joyce does not have a family history of obstructive sleep apnea. Yvette Joyce has allergy symptoms. Yvette Joyce had nasal surgery years ago for deviated septum. Yvette Joyce denies morning headaches. Yvette Joyce has nocturia, typically once or twice per night. Yvette Joyce Epworth sleepiness score is 14 out of 24 today, Yvette Joyce fatigue scores 46 out of 63. Yvette Joyce does not smoke. Yvette Joyce drinks alcohol occasionally. Yvette Joyce drinks caffeine in the form of 5 hour energy drinks once a day. Yvette Joyce has been on Adderall XR 20 mg once daily for about a year. It has helped Yvette Joyce concentration but not necessarily Yvette Joyce sleepiness. Yvette Joyce reports memory issues. Yvette Joyce feels that Yvette Joyce is becoming forgetful. Yvette Joyce has a family history of heart disease and stroke. Yvette Joyce lives alone. Yvette Joyce boyfriend has commented on Yvette Joyce snoring that has not told Yvette Joyce that Yvette Joyce has pauses in Yvette Joyce breathing. Yvette Joyce has woken herself up with a sense of gagging at times. I reviewed your office note from 03/21/15, which you kindly included. Yvette Joyce had blood work in July through Yvette Joyce [place], and Yvette Joyce synthroid was increased.     Yvette Joyce Past Medical  History Is Significant For: Past Medical History  Diagnosis Date  . Hypothyroidism   . Headache(784.0)   . Constipation   . Complication of anesthesia     "hard to wake up"  . Hematuria 04/12/2013  . Fatigue 03/07/2015  . Sleep disturbance 03/07/2015  . Postmenopause 03/07/2015  . Snoring   . Adult attention deficit disorder     Yvette Joyce Past Surgical History Is Significant For: Past Surgical History  Procedure Laterality Date  . Fibroids removed from uterus    . Cosmetic eye surgery    . Colonoscopy  09/24/2011    LEX:NTZGYFV polyps in the ascending colon/diverticula in the sigmoid colon/internal hemorrhoids  . Nasal septum surgery    . Polypectomy  12/03/2011    Procedure: POLYPECTOMY;  Surgeon: Jonnie Kind, MD;  Location: [facility];  Service: Gynecology;  Laterality: N/A;  resection of polyp  .  Hysteroscopy w/d&c  12/03/2011    Procedure: DILATATION AND CURETTAGE /HYSTEROSCOPY;  Surgeon: Jonnie Kind, MD;  Location: [facility];  Service: Gynecology;  Laterality: N/A;  . Laparoscopic supracervical hysterectomy  07/11/2012    Procedure: LAPAROSCOPIC SUPRACERVICAL HYSTERECTOMY;  Surgeon: Jonnie Kind, MD;  Location: [facility];  Service: Gynecology;  Laterality: N/A;  . Laparoscopic salpingo oopherectomy  07/11/2012    Procedure: LAPAROSCOPIC SALPINGO OOPHORECTOMY;  Surgeon: Jonnie Kind, MD;  Location: [facility];  Service: Gynecology;  Laterality: Bilateral;  . Abdominal hysterectomy    . Knee arthroscopy w/ debridement    . Lasik      Yvette Joyce Family History Is Significant For: Family History  Problem Relation Age of Onset  . Colon cancer Maternal Uncle   . Cancer Sister 44    breast   . Heart disease Father   . Stroke Mother     Yvette Joyce Social History Is Significant For: Social History   Social History  . Marital Status: Divorced    Spouse Name: N/A  . Number of Children: 0  . Years of Education: College   Social History Main Topics  . Smoking status: Never Smoker   . Smokeless  tobacco: Never Used  . Alcohol Use: 0.6 oz/week    1 Shots of liquor per week     Comment: 1-2 times a week  . Drug Use: No  . Sexual Activity: Yes    Birth Control/ Protection: Surgical   Other Topics Concern  . None   Social History Narrative   Drinks about 5- 5 Hour [employer] a week     Yvette Joyce Allergies Are:  No Known Allergies:   Yvette Joyce Current Medications Are:  Outpatient Encounter Prescriptions as of 09/22/2015  Medication Sig  . ADDERALL XR 20 MG 24 hr capsule   . Armodafinil (NUVIGIL) 250 MG tablet Take 1 tablet (250 mg total) by mouth daily.  . Cholecalciferol (VITAMIN D PO) Take 1 tablet by mouth daily. Unsure of strength  . fish oil-omega-3 fatty acids 1000 MG capsule Take 2 g by mouth daily.  Marland Kitchen levothyroxine (SYNTHROID, LEVOTHROID) 100 MCG tablet Take 1 tablet (100 mcg total) by mouth daily before breakfast.  . Multiple Vitamin (MULITIVITAMIN WITH MINERALS) TABS Take 1 tablet by mouth daily.  . polyethylene glycol powder (GLYCOLAX/MIRALAX) powder Take 17 g by mouth daily.  Marland Kitchen terbinafine (LAMISIL) 250 MG tablet    No facility-administered encounter medications on file as of 09/22/2015.  :  Review of Systems:  Out of a complete 14 point review of systems, all are reviewed and negative with the exception of these symptoms as listed below:   Review of Systems  Neurological:       Patient feels like the Nuvigil has helped Yvette Joyce enormously.     Objective:  Neurologic Exam  Physical Exam Physical Examination:   Filed Vitals:   09/22/15 1307  BP: 128/72  Pulse: 70  Resp: 16   General Examination: The patient is a very pleasant 55 y.o. female in no acute distress. Yvette Joyce appears well-developed and well-nourished and very well groomed. Yvette Joyce is in good spirits today.    HEENT: Normocephalic, atraumatic, pupils are equal, round and reactive to light and accommodation. Extraocular tracking is good without limitation to gaze excursion or nystagmus noted. Normal smooth pursuit is  noted. Hearing is grossly intact. Face is symmetric with normal facial animation and normal facial sensation. Speech is clear with no dysarthria noted. There is no hypophonia. There is no lip,  neck/head, jaw or voice tremor. Neck is supple with full range of passive and active motion. There are no carotid bruits on auscultation. Oropharynx exam reveals: mild mouth dryness, good dental hygiene and mild airway crowding, due to narrow airway entry. Mallampati is class II. Tongue protrudes centrally and palate elevates symmetrically. Tonsils are small or absent (?).   Chest: Clear to auscultation without wheezing, rhonchi or crackles noted.  Heart: S1+S2+0, regular and normal without murmurs, rubs or gallops noted.   Abdomen: Soft, non-tender and non-distended with normal bowel sounds appreciated on auscultation.  Extremities: There is no pitting edema in the distal lower extremities bilaterally. Pedal pulses are intact.  Skin: Warm and dry without trophic changes noted. There are no varicose veins.  Musculoskeletal: exam reveals no obvious joint deformities, tenderness or joint swelling or erythema, with the exception of mild left knee swelling and mild tenderness.   Neurologically:  Mental status: The patient is awake, alert and oriented in all 4 spheres. Yvette Joyce immediate and remote memory, attention, language skills and fund of knowledge are appropriate. There is no evidence of aphasia, agnosia, apraxia or anomia. Speech is clear with normal prosody and enunciation. Thought process is linear. Mood is normal and affect is normal.  Cranial nerves II - XII are as described above under HEENT exam. In addition: shoulder shrug is normal with equal shoulder height noted. Motor exam: Normal bulk, strength and tone is noted. There is no drift, tremor or rebound. Romberg is negative. Reflexes are 2+ throughout. Fine motor skills and coordination: intact with normal finger taps, normal hand movements, normal rapid  alternating patting, normal foot taps and normal foot agility.  Cerebellar testing: No dysmetria or intention tremor on finger to nose testing. Heel to shin is unremarkable on the right and slightly more difficult on the left secondary to knee stiffness reported. There is no truncal or gait ataxia.  Sensory exam: intact to light touch in the upper and lower extremities.  Gait, station and balance: Yvette Joyce stands easily. No veering to one side is noted. No leaning to one side is noted. Posture is age-appropriate and stance is narrow based. Gait shows normal stride length and normal pace. No problems turning are noted. Yvette Joyce turns en bloc.    Assessment and Plan:   In summary, KONNOR JORDEN is a very pleasant 55 year old female with an underlying medical history of allergic rhinitis, hypothyroidism and ADHD, who presents for follow-up consultation of Yvette Joyce sleep disturbance, most likely circadian rhythm disorder, shift work type. Yvette Joyce had a baseline sleep study and a nap study in October 2016. We briefly reviewed Yvette Joyce test results again today. Yvette Joyce has done rather well with Nuvigil 250 mg once daily. Yvette Joyce has decreased Yvette Joyce caffeine intake particularly Yvette Joyce energy drinks and would like to taper off Adderall XR. Yvette Joyce has reduce it to once every other day at this point. Yvette Joyce has no significant RLS symptoms. Yvette Joyce had very mild PLMS during Yvette Joyce nighttime sleep study without much in the way of arousals. Yvette Joyce has no significant sleep disordered breathing. Yvette Joyce history is not in keeping with narcolepsy or idiopathic hypersomnolence. Yvette Joyce sleep study results were not in keeping with narcolepsy. Yvette Joyce nap study showed a mean sleep latency of 12.1 minutes, which is not in keeping with a significant hypersomnolence disorder such as narcolepsy or idiopathic hypersomnolence. Yvette Joyce did have 2 REM onset naps out of 4 naps, but these occurred in naps #1 and 2, this in the context of a decreased REM percentage  and delayed REM onset during the nighttime  sleep study. I suggested we continue with Nuvigil starting with 250 mg strength, once daily before Yvette Joyce goes to work. Yvette Joyce has utilized melatonin as needed. Yvette Joyce is encouraged to stay well-hydrated. Yvette Joyce had recent blood work with Yvette Joyce primary care provider. Yvette Joyce denies any side effects in particular, no anxiety, jitteriness, tremors, HAs or palpitations. I provided Yvette Joyce with written instructions and a  refill on Yvette Joyce prescription for Nuvigil. I would like to see Yvette Joyce back in 6 months, sooner if needed. Yvette Joyce exam is stable and Yvette Joyce was reassured that Yvette Joyce does not have any obstructive sleep apnea.  I answered all Yvette Joyce questions today and Yvette Joyce was in agreement. Yvette Joyce was advised to call or email with any interim questions or concerns.  I spent 15 minutes in total face-to-face time with the patient, more than 50% of which was spent in counseling and coordination of care, reviewing test results, reviewing medication and discussing or reviewing the diagnosis of hypersomnolence and shift work disorder, the prognosis and treatment options.

## 2015-09-26 ENCOUNTER — Ambulatory Visit
Admission: RE | Admit: 2015-09-26 | Discharge: 2015-09-26 | Disposition: A | Discharge status: Home / Self Care | Payer: 59 | Source: Ambulatory Visit

## 2015-09-26 DIAGNOSIS — Z1231 Encounter for screening mammogram for malignant neoplasm of breast: Secondary | ICD-10-CM

## 2015-10-01 ENCOUNTER — Telehealth: Payer: Self-pay | Admitting: Adult Health

## 2015-10-01 NOTE — Telephone Encounter (Signed)
Yvette Joyce had labs with Dr Gerarda Fraction, PCP in 09/15/15 and TSH 3.210, glucose 100, TC 224,trig 28 HDL 91 LDL 127. Left message to continue synthroid

## 2015-10-06 ENCOUNTER — Other Ambulatory Visit: Payer: Self-pay | Admitting: Adult Health

## 2016-01-27 ENCOUNTER — Other Ambulatory Visit: Payer: Self-pay | Admitting: Neurology

## 2016-01-28 ENCOUNTER — Other Ambulatory Visit: Payer: Self-pay | Admitting: Neurology

## 2016-01-28 NOTE — Telephone Encounter (Signed)
I spoke to patient who says that [facility] states that she does not have any refills left.  I called [facility], they state that they are not showing anymore refills. They need new Rx. Will fax in new one to last her until next appt.

## 2016-01-28 NOTE — Telephone Encounter (Signed)
Pt called said pharmacy has requested refill for Armodafinil (NUVIGIL) 250 MG tablet . She has 1 tab left for today. She is very confused as to why she can't have the medication. Pt said sometimes on the weekends she doesn't take any of this medication. Please call her at [phone]

## 2016-02-04 ENCOUNTER — Other Ambulatory Visit (HOSPITAL_COMMUNITY): Payer: Self-pay | Admitting: Orthopedic Surgery

## 2016-02-04 DIAGNOSIS — M25562 Pain in left knee: Secondary | ICD-10-CM

## 2016-02-05 ENCOUNTER — Other Ambulatory Visit (HOSPITAL_COMMUNITY): Payer: Self-pay | Admitting: Orthopedic Surgery

## 2016-02-05 ENCOUNTER — Other Ambulatory Visit (HOSPITAL_COMMUNITY): Payer: Self-pay | Admitting: Orthopaedic Surgery

## 2016-02-05 DIAGNOSIS — M25562 Pain in left knee: Secondary | ICD-10-CM

## 2016-02-11 ENCOUNTER — Ambulatory Visit (HOSPITAL_COMMUNITY)
Admission: RE | Admit: 2016-02-11 | Discharge: 2016-02-11 | Disposition: A | Discharge status: Home / Self Care | Payer: 59 | Source: Ambulatory Visit | Attending: Orthopedic Surgery | Admitting: Orthopedic Surgery

## 2016-02-11 DIAGNOSIS — M25562 Pain in left knee: Secondary | ICD-10-CM

## 2016-02-18 ENCOUNTER — Other Ambulatory Visit: Payer: 59 | Admitting: Adult Health

## 2016-03-12 ENCOUNTER — Encounter: Payer: Self-pay | Admitting: Adult Health

## 2016-03-12 ENCOUNTER — Ambulatory Visit (INDEPENDENT_AMBULATORY_CARE_PROVIDER_SITE_OTHER): Payer: 59 | Admitting: Adult Health

## 2016-03-12 VITALS — BP 124/76 | HR 82 | Ht 64.5 in | Wt 144.5 lb

## 2016-03-12 DIAGNOSIS — Z01411 Encounter for gynecological examination (general) (routine) with abnormal findings: Secondary | ICD-10-CM | POA: Diagnosis not present

## 2016-03-12 DIAGNOSIS — E039 Hypothyroidism, unspecified: Secondary | ICD-10-CM | POA: Diagnosis not present

## 2016-03-12 DIAGNOSIS — Z01419 Encounter for gynecological examination (general) (routine) without abnormal findings: Secondary | ICD-10-CM

## 2016-03-12 DIAGNOSIS — Z1211 Encounter for screening for malignant neoplasm of colon: Secondary | ICD-10-CM

## 2016-03-12 LAB — HEMOCCULT GUIAC POC 1CARD (OFFICE): FECAL OCCULT BLD: NEGATIVE

## 2016-03-12 MED ORDER — IBUPROFEN 800 MG PO TABS: 800.0000 mg | ORAL_TABLET | Freq: Three times a day (TID) | ORAL | 1 refills | Status: DC | PRN

## 2016-03-12 NOTE — Patient Instructions (Signed)
Physical in 1 year Mammogram yearly  Labs with PCP  Colonoscopy per GI

## 2016-03-12 NOTE — Progress Notes (Addendum)
Patient ID: Yvette Joyce, female   DOB: 1961-07-14, 55 y.o.   MRN: [id number] History of Present Illness: Yvette Joyce is a 55 year old white female, single in for a well woman gyn exam,she had a normal pap with negative HPV 02/28/14.She is having pain in left knee and has seen Dr Durward Fortes. She is tired a lot but works nights at [employer].  PCP is Dr Gerarda Fraction.   Current Medications, Allergies, Past Medical History, Past Surgical History, Family History and Social History were reviewed in [facility] record.     Review of Systems: Patient denies any headaches, hearing loss, blurred vision, shortness of breath, chest pain, abdominal pain, problems with bowel movements, urination, or intercourse. No mood swings.See HPI for positives.     Physical Exam:BP 124/76 (BP Location: Left Arm, Patient Position: Sitting, Cuff Size: Normal)   Pulse 82   Ht 5' 4.5" (1.638 m)   Wt 144 lb 8 oz (65.5 kg)   LMP 11/25/2011   BMI 24.42 kg/m  General:  Well developed, well nourished, no acute distress Skin:  Warm and dry.tan Neck:  Midline trachea, normal thyroid, good ROM, no lymphadenopathy Lungs; Clear to auscultation bilaterally Breast:  No dominant palpable mass, retraction, or nipple discharge Cardiovascular: Regular rate and rhythm Abdomen:  Soft, non tender, no hepatosplenomegaly,navel pierced  Pelvic:  External genitalia is normal in appearance, no lesions.  The vagina is normal in appearance. Urethra has no lesions or masses. The cervix is atrophic.  Uterus is absent.  No adnexal masses or tenderness noted.Bladder is non tender, no masses felt. Rectal: Good sphincter tone, no polyps, or hemorrhoids felt.  Hemoccult negative. Extremities/musculoskeletal:  No swelling or varicosities noted, no clubbing or cyanosis Psych:  No mood changes, alert and cooperative,seems happy. She requests refill on motrin.   Impression: Well woman gyn exam no pap Hypothyroid     Plan: Pap  and physical in 1 year Mammogram yearly Colonoscopy per GI Labs with PCP  Rx motrin 800 mg #60 take 1 every 8 hours prn pain with 1 refill

## 2016-03-22 ENCOUNTER — Encounter: Payer: Self-pay | Admitting: Neurology

## 2016-03-22 ENCOUNTER — Telehealth: Payer: Self-pay | Admitting: Neurology

## 2016-03-22 ENCOUNTER — Ambulatory Visit (INDEPENDENT_AMBULATORY_CARE_PROVIDER_SITE_OTHER): Payer: 59 | Admitting: Neurology

## 2016-03-22 VITALS — BP 128/82 | HR 86 | Resp 16 | Ht 66.0 in | Wt 146.0 lb

## 2016-03-22 DIAGNOSIS — G4726 Circadian rhythm sleep disorder, shift work type: Secondary | ICD-10-CM | POA: Diagnosis not present

## 2016-03-22 DIAGNOSIS — G4710 Hypersomnia, unspecified: Secondary | ICD-10-CM | POA: Diagnosis not present

## 2016-03-22 MED ORDER — ARMODAFINIL 250 MG PO TABS: 250.0000 mg | ORAL_TABLET | Freq: Every day | ORAL | 5 refills | Status: DC

## 2016-03-22 NOTE — Progress Notes (Signed)
Subjective:    Patient ID: Yvette Joyce is a 55 y.o. female.  HPI     Interim history:    Yvette Joyce is a very pleasant 55 year old right-handed woman with an underlying medical history of allergic rhinitis, hypothyroidism and ADHD, who presents for follow-up consultation of her sleep disorder, including hypersomnolence in the context of shift work. The patient is unaccompanied today. I last saw her on 09/22/2015, at which time she reported that the increase in Nuvigil helped. She felt much improved. She was more alert at work and had no side effects. She stopped using her energy drinks that she was utilizing before. In fact, she also felt she could do without Adderall. She was in the process of reducing the Adderall. She had recent blood work with her PCP. She had a borderline cholesterol. We mutually agreed to continue with Nuvigil 250 mg once daily before she would go to work. We talked about her sleep study results at the time. She had a mean sleep latency of 12.1 minute, but did achieve REM sleep in 2 out of 4 naps, nap #1 and 2, in the context of decreased REM percentage and delayed REM onset during the nighttime sleep study, findings in conjunction with her clinical history suggestive of delayed sleep phase syndrome and shift work sleep disorder.   Today, 03/22/2016: She reports that she has been off the Adderall for the past 3 months but she's not doing as well as before. To be just on the Nuvigil does not seem to help as well. She took herself off of it in the hope, that she could do without it, but has had more issues with attention, and focus and misplacing things, especially her cell phone.  He has some pills left and is wondering, if she should go back on it. She has had more L knee pain, cannot exercise as well. She still thinks, overall, she has done well with the Nuvigil. She reports no side effects. In particular, no nervousness, no jitteriness, no headaches, has gained a little bit  overweight, which could be due to being off of Adderall but also less exercise because of left knee pain.   Previously:  I saw her on 06/23/2015, at which time we talked about her PSG and MSLT from 06/08/2015 as well as 06/09/2015, respectively. She had no significant sleep disordered breathing. She had mild PLMS during the nighttime sleep study without significant arousals. She had no significant RLS symptoms. Her nap study showed a mean sleep latency of 12.1 minutes as well as to REM onset naps out of 4 naps, in nap #1 and 2. She gave no telltale history of narcolepsy and her nighttime sleep study showed a decreased percentage of REM sleep in a prolonged REM latency as well as an increased percentage of light stage sleep. I felt that in light of her shift work disorder this was most likely a circadian rhythm disorder of his shift work type. She was advised to try Nuvigil 150 mg strength once daily before going to work. She was also advised to try to bright light therapy when she first wakes up and melatonin which he goes to bed. On 07/15/15 she called and reported residual sleepiness. We increased her Nuvigil to 250 mg once daily.      I first met her on 05/12/2015 at the request of her primary care provider, at which time she reported snoring, excessive daytime somnolence, nonrestorative sleep and shift work for 6 years. I invited  her back for sleep study at night, followed by a daytime MSLT. She had a baseline sleep study on 06/08/2015 and a next a nap study on 06/09/2015 and I went over her test results with her in detail today. Her nighttime sleep study showed a sleep efficiency of reduced at 77.7% with a normal sleep latency of 16.5 minutes and wake after sleep onset of 88.5 minutes with mild to moderate sleep fragmentation noted. The arousal index was 13.8 per hour, primarily secondary to spontaneous arousals. She had an increased percentage of stage I and stage II sleep at 14.1 and 73.5% respectively,  absence of slow-wave sleep and a decreased percentage of REM sleep at 12.4% with a prolonged REM latency of 164 minutes. She had mild PLMS with an index of 6.2 per hour, resulting in 1.5 arousals per hour. She had no significant EKG changes. She had mild intermittent snoring. Total AHI was 1 per hour, average oxygen saturation was 95%, nadir was 90%. She had a next a MSLT on 06/09/2015 which showed a mean sleep latency of 12.5 minutes indicating mild sleepiness. REM sleep was noted during nap #1 and 2. I felt that her studies did not support the diagnosis of narcolepsy or idiopathic hypersomnolence based on a mean sleep latency of over 8 minutes, the fact that she had decreased REM sleep and delay and REM sleep during the nighttime study and the fact that she had REM sleep during nap #1 and 2 only, would support a circadian rhythm disorder such as delayed sleep phase syndrome, and could reflect shift work disorder based on her work schedule and sleep schedule typically.    05/12/2015: She reports snoring and excessive daytime somnolence. She has not felt rested in years, she can fall asleep anytime and fall asleep at the drop of a hat. She does not need to have the TV on at night. She works from 4 PM to 2 AM, M-Th, off on Friday through Sun. She has had this work schedule for 6 years. She works in [employer], running a machine, never had issues performing at works, never fallen asleep at the wheel. She denies episodes of sleep paralysis, hypnagogic or hypnopompic hallucinations, or cataplexy. She feels that she rarely dreams. When she dreams these are not good dreams. She denies restless leg symptoms. She is a restless sleeper. She does not wake up rested. She has had sleepiness during the day for years. She goes to bed around 3 AM but may not fall asleep until 4 or 4:30. Rise time is between 10 AM and noon. On Fridays and Saturdays as well as Sundays she goes to bed around 11 PM. She does not have a  family history of obstructive sleep apnea. She has allergy symptoms. She had nasal surgery years ago for deviated septum. She denies morning headaches. She has nocturia, typically once or twice per night. Her Epworth sleepiness score is 14 out of 24 today, her fatigue scores 46 out of 63. She does not smoke. She drinks alcohol occasionally. She drinks caffeine in the form of 5 hour energy drinks once a day. She has been on Adderall XR 20 mg once daily for about a year. It has helped her concentration but not necessarily her sleepiness. She reports memory issues. She feels that she is becoming forgetful. She has a family history of heart disease and stroke. She lives alone. Her boyfriend has commented on her snoring that has not told her that she has pauses in her  breathing. She has woken herself up with a sense of gagging at times. I reviewed your office note from 03/21/15, which you kindly included. She had blood work in July through her [place], and her synthroid was increased.       Her Past Medical History Is Significant For: Past Medical History:  Diagnosis Date  . Adult attention deficit disorder   . Complication of anesthesia    "hard to wake up"  . Constipation   . Fatigue 03/07/2015  . Headache(784.0)   . Hematuria 04/12/2013  . Hypothyroidism   . Postmenopause 03/07/2015  . Sleep disturbance 03/07/2015  . Snoring     Her Past Surgical History Is Significant For: Past Surgical History:  Procedure Laterality Date  . ABDOMINAL HYSTERECTOMY    . COLONOSCOPY  09/24/2011   [id number] polyps in the ascending colon/diverticula in the sigmoid colon/internal hemorrhoids  . cosmetic eye surgery    . fibroids removed from uterus    . HYSTEROSCOPY W/D&C  12/03/2011   Procedure: DILATATION AND CURETTAGE /HYSTEROSCOPY;  Surgeon: Jonnie Kind, MD;  Location: [facility];  Service: Gynecology;  Laterality: N/A;  . KNEE ARTHROSCOPY W/ DEBRIDEMENT    . LAPAROSCOPIC SALPINGO OOPHERECTOMY  07/11/2012    Procedure: LAPAROSCOPIC SALPINGO OOPHORECTOMY;  Surgeon: Jonnie Kind, MD;  Location: [facility];  Service: Gynecology;  Laterality: Bilateral;  . LAPAROSCOPIC SUPRACERVICAL HYSTERECTOMY  07/11/2012   Procedure: LAPAROSCOPIC SUPRACERVICAL HYSTERECTOMY;  Surgeon: Jonnie Kind, MD;  Location: [facility];  Service: Gynecology;  Laterality: N/A;  . LASIK    . NASAL SEPTUM SURGERY    . POLYPECTOMY  12/03/2011   Procedure: POLYPECTOMY;  Surgeon: Jonnie Kind, MD;  Location: [facility];  Service: Gynecology;  Laterality: N/A;  resection of polyp  . VEIN SURGERY     Varicose veins    Her Family History Is Significant For: Family History  Problem Relation Age of Onset  . Colon cancer Maternal Uncle   . Cancer Sister 49    breast   . Heart disease Father   . Stroke Mother     Her Social History Is Significant For: Social History   Social History  . Marital status: Divorced    Spouse name: N/A  . Number of children: 0  . Years of education: College   Social History Main Topics  . Smoking status: Never Smoker  . Smokeless tobacco: Never Used  . Alcohol use 0.6 oz/week    1 Shots of liquor per week     Comment: 1-2 times a week  . Drug use: No  . Sexual activity: Yes    Birth control/ protection: Surgical     Comment: supracervical hyst   Other Topics Concern  . None   Social History Narrative   Drinks about 5- 5 Hour Energys a week     Her Allergies Are:  No Known Allergies:   Her Current Medications Are:  Outpatient Encounter Prescriptions as of 03/22/2016  Medication Sig  . Armodafinil 250 MG tablet TAKE ONE TABLET BY MOUTH ONCE DAILY.  Marland Kitchen Cholecalciferol (VITAMIN D PO) Take 1 tablet by mouth daily. Unsure of strength  . fish oil-omega-3 fatty acids 1000 MG capsule Take 2 g by mouth daily.  Marland Kitchen ibuprofen (ADVIL,MOTRIN) 800 MG tablet Take 1 tablet (800 mg total) by mouth every 8 (eight) hours as needed.  Marland Kitchen levothyroxine (SYNTHROID, LEVOTHROID) 100 MCG tablet TAKE 1 TABLET BY  MOUTH DAILY BEFORE BREAKFAST.  . Multiple Vitamin (MULITIVITAMIN WITH MINERALS) TABS  Take 1 tablet by mouth daily.  . polyethylene glycol powder (GLYCOLAX/MIRALAX) powder Take 17 g by mouth daily.  Marland Kitchen XIIDRA 5 % SOLN Place into both eyes 2 (two) times daily.    No facility-administered encounter medications on file as of 03/22/2016.   :  Review of Systems:  Out of a complete 14 point review of systems, all are reviewed and negative with the exception of these symptoms as listed below: Review of Systems  Neurological:       Patient has been off of Adderall for about 3+ months. States that she feels very "scattered brain". She does feel that the Armodafinil helps keep her awake during the day.     Objective:  Neurologic Exam  Physical Exam Physical Examination:   Vitals:   03/22/16 1035  BP: 128/82  Pulse: 86  Resp: 16   General Examination: The patient is a very pleasant 55 y.o. female in no acute distress. She appears well-developed and well-nourished and very well groomed. She is in good spirits today, as is her usual.    HEENT: Normocephalic, atraumatic, pupils are equal, round and reactive to light and accommodation. Extraocular tracking is good without limitation to gaze excursion or nystagmus noted. Normal smooth pursuit is noted. Hearing is grossly intact. Face is symmetric with normal facial animation and normal facial sensation. Speech is clear with no dysarthria noted. There is no hypophonia. There is no lip, neck/head, jaw or voice tremor. Neck is supple with full range of passive and active motion. There are no carotid bruits on auscultation. Oropharynx exam reveals: mild mouth dryness, good dental hygiene and mild airway crowding, due to narrow airway entry. Mallampati is class II. Tongue protrudes centrally and palate elevates symmetrically.   Chest: Clear to auscultation without wheezing, rhonchi or crackles noted.  Heart: S1+S2+0, regular and normal without murmurs, rubs  or gallops noted.   Abdomen: Soft, non-tender and non-distended with normal bowel sounds appreciated on auscultation.  Extremities: There is no pitting edema in the distal lower extremities bilaterally. Pedal pulses are intact.  Skin: Warm and dry without trophic changes noted. There are no varicose veins.  Musculoskeletal: exam reveals no obvious joint deformities, tenderness or joint swelling or erythema, with the exception of mild left knee swelling and mild tenderness, appears stable.   Neurologically:  Mental status: The patient is awake, alert and oriented in all 4 spheres. Her immediate and remote memory, attention, language skills and fund of knowledge are appropriate. There is no evidence of aphasia, agnosia, apraxia or anomia. Speech is clear with normal prosody and enunciation. Thought process is linear. Mood is normal and affect is normal.  Cranial nerves II - XII are as described above under HEENT exam. In addition: shoulder shrug is normal with equal shoulder height noted. Motor exam: Normal bulk, strength and tone is noted. There is no drift, tremor or rebound. Romberg is negative. Reflexes are 2+ throughout. Fine motor skills and coordination: intact with normal finger taps, normal hand movements, normal rapid alternating patting, normal foot taps and normal foot agility.  Cerebellar testing: No dysmetria or intention tremor on finger to nose testing. Heel to shin is unremarkable on the right and slightly more difficult on the left secondary to knee stiffness reported. There is no truncal or gait ataxia.  Sensory exam: intact to light touch in the upper and lower extremities.  Gait, station and balance: She stands easily. No veering to one side is noted. No leaning to one side is noted.  Posture is age-appropriate and stance is narrow based. Gait shows normal stride length and normal pace.    Assessment and Plan:   In summary, Yvette Joyce is a very pleasant 55 year old female  with an underlying medical history of allergic rhinitis, hypothyroidism and ADHD, who presents for follow-up consultation of her Hypersomnolence disorder, in conjunction with circadian rhythm disorder, shift work type. She has done overall quite well with Nuvigil which is currently generic 250 mg strength once daily, she continues to do well on it without side effects reported good results overall. Nevertheless, after she stopped her Adderall long-acting for ADHD/ADD, she feels, she has had more difficulty focusing and with attention, has misplaced things such as her cell phone. I asked her to restart her Adderall and request prescription from her primary care provider as before. From my end of things, I suggested we continue with generic armodafinil 250 mg strength once daily and I renewed her prescription. Overall, she feels she has done much better with it and has been able to reduce her caffeine intake. She has no significant RLS symptoms. She had very mild PLMS during her nighttime sleep study without much in the way of arousals. She has no significant sleep disordered breathing. Her history is not in keeping with narcolepsy. Her sleep study results were not in keeping with narcolepsy. Her nap study showed a mean sleep latency of 12.1 minutes, which is not in keeping with narcolepsy or idiopathic hypersomnolence. She did have 2 REM onset naps out of 4 naps, but these occurred in naps #1 and 2, this in the context of a decreased REM percentage and delayed REM onset during the nighttime sleep study. She has utilized melatonin as needed. She is encouraged to stay well-hydrated. I suggested a 6 month follow-up, since she is doing reasonably well, she is advised that she can follow-up with one of her nurse practitioners at the time, I will see her back after that. I answered all her questions today and she was in agreement. Physical exam and neurological exam are stable. I spent 25 minutes in total face-to-face  time with the patient, more than 50% of which was spent in counseling and coordination of care, reviewing test results, reviewing medication and discussing or reviewing the diagnosis of hypersomnolence and shift work disorder, the prognosis and treatment options.

## 2016-03-22 NOTE — Telephone Encounter (Signed)
Patient called 10:17:29am to advise she is stuck in traffic on [address], near [place] ([place]), should be here in a few minutes for 10:30am appointment with Dr. Rexene Alberts.

## 2016-03-22 NOTE — Telephone Encounter (Signed)
Patient was able to get here on time and is checked in.

## 2016-03-22 NOTE — Patient Instructions (Addendum)
Please restart your Adderall XR and your PCP can continue to prescribe it as before for your ADD/ADHD.  We will continue with your Nuvigil generic 250 mg. We will see you back in 6 months, you can see one of our nurse practitioners at the time, either [place] or [place].

## 2016-04-03 ENCOUNTER — Other Ambulatory Visit: Payer: Self-pay | Admitting: Neurology

## 2016-04-08 ENCOUNTER — Ambulatory Visit (INDEPENDENT_AMBULATORY_CARE_PROVIDER_SITE_OTHER): Payer: 59 | Admitting: Orthopaedic Surgery

## 2016-04-08 ENCOUNTER — Ambulatory Visit (INDEPENDENT_AMBULATORY_CARE_PROVIDER_SITE_OTHER): Payer: 59

## 2016-04-08 ENCOUNTER — Encounter: Payer: Self-pay | Admitting: Orthopaedic Surgery

## 2016-04-08 VITALS — BP 116/70 | HR 72 | Ht 64.25 in | Wt 139.0 lb

## 2016-04-08 DIAGNOSIS — M67431 Ganglion, right wrist: Secondary | ICD-10-CM

## 2016-04-08 DIAGNOSIS — M25531 Pain in right wrist: Secondary | ICD-10-CM | POA: Diagnosis not present

## 2016-04-08 NOTE — Progress Notes (Signed)
Subjective: I have a cyst on my right wrist    Patient ID: Yvette Joyce, female    DOB: Jul 04, 1961, 55 y.o.   MRN: [id number]  HPI Over the last month she has developed a small cyst over the right radial styloid area.  Last weekend she hit her wrist and had increased pain and noticed a new smaller cyst.  She has tenderness with moving her wrist.  She is concerned she has two small cysts on her wrist.  She has no numbness or bruising.  She has tried heat and ice.  She has a small splint and has used it.  She has no other joint problems.  She was seen by family doctor at [facility] and given prednisone pack to try.  She has not taken the medicine.   Review of Systems  HENT: Negative for congestion.   Respiratory: Negative for cough and shortness of breath.   Cardiovascular: Negative for chest pain and leg swelling.  Endocrine: Positive for cold intolerance.  Musculoskeletal: Positive for arthralgias.  Allergic/Immunologic: Positive for environmental allergies.  Neurological: Positive for headaches.  Psychiatric/Behavioral: Positive for sleep disturbance.   Past Medical History:  Diagnosis Date  . Adult attention deficit disorder   . Arthritis   . Complication of anesthesia    "hard to wake up"  . Constipation   . Fatigue 03/07/2015  . Headache(784.0)   . Hematuria 04/12/2013  . Hypothyroidism   . Postmenopause 03/07/2015  . Sleep disturbance 03/07/2015  . Snoring   . Thyroid disease     Past Surgical History:  Procedure Laterality Date  . ABDOMINAL HYSTERECTOMY    . COLONOSCOPY  09/24/2011   [id number] polyps in the ascending colon/diverticula in the sigmoid colon/internal hemorrhoids  . cosmetic eye surgery    . fibroids removed from uterus    . HYSTEROSCOPY W/D&C  12/03/2011   Procedure: DILATATION AND CURETTAGE /HYSTEROSCOPY;  Surgeon: Jonnie Kind, MD;  Location: [facility];  Service: Gynecology;  Laterality: N/A;  . KNEE ARTHROSCOPY W/ DEBRIDEMENT    . LAPAROSCOPIC  SALPINGO OOPHERECTOMY  07/11/2012   Procedure: LAPAROSCOPIC SALPINGO OOPHORECTOMY;  Surgeon: Jonnie Kind, MD;  Location: [facility];  Service: Gynecology;  Laterality: Bilateral;  . LAPAROSCOPIC SUPRACERVICAL HYSTERECTOMY  07/11/2012   Procedure: LAPAROSCOPIC SUPRACERVICAL HYSTERECTOMY;  Surgeon: Jonnie Kind, MD;  Location: [facility];  Service: Gynecology;  Laterality: N/A;  . LASIK    . NASAL SEPTUM SURGERY    . POLYPECTOMY  12/03/2011   Procedure: POLYPECTOMY;  Surgeon: Jonnie Kind, MD;  Location: [facility];  Service: Gynecology;  Laterality: N/A;  resection of polyp  . VEIN SURGERY     Varicose veins    Current Outpatient Prescriptions on File Prior to Visit  Medication Sig Dispense Refill  . Armodafinil 250 MG tablet Take 1 tablet (250 mg total) by mouth daily. 30 tablet 5  . Cholecalciferol (VITAMIN D PO) Take 1 tablet by mouth daily. Unsure of strength    . fish oil-omega-3 fatty acids 1000 MG capsule Take 2 g by mouth daily.    Marland Kitchen ibuprofen (ADVIL,MOTRIN) 800 MG tablet Take 1 tablet (800 mg total) by mouth every 8 (eight) hours as needed. 60 tablet 1  . levothyroxine (SYNTHROID, LEVOTHROID) 100 MCG tablet TAKE 1 TABLET BY MOUTH DAILY BEFORE BREAKFAST. 30 tablet 6  . Multiple Vitamin (MULITIVITAMIN WITH MINERALS) TABS Take 1 tablet by mouth daily.    . polyethylene glycol powder (GLYCOLAX/MIRALAX) powder Take 17 g by  mouth daily. 255 g 0  . XIIDRA 5 % SOLN Place into both eyes 2 (two) times daily.      No current facility-administered medications on file prior to visit.     Social History   Social History  . Marital status: Divorced    Spouse name: N/A  . Number of children: 0  . Years of education: College   Occupational History  . Not on file.   Social History Main Topics  . Smoking status: Never Smoker  . Smokeless tobacco: Never Used  . Alcohol use 0.6 oz/week    1 Shots of liquor per week     Comment: 1-2 times a week  . Drug use: No  . Sexual activity: Yes     Birth control/ protection: Surgical     Comment: supracervical hyst   Other Topics Concern  . Not on file   Social History Narrative   Drinks about 5- 5 Hour Energ[employer] a week     Family History  Problem Relation Age of Onset  . Colon cancer Maternal Uncle   . Cancer Sister   . Thyroid disease Sister   . Heart disease Father   . Hypertension Father   . Stroke Mother   . Asthma Mother   . Hypertension Mother     BP 116/70   Pulse 72   Ht 5' 4.25" (1.632 m)   Wt 139 lb (63 kg)   LMP 11/25/2011   BMI 23.67 kg/m      Objective:   Physical Exam  Constitutional: She is oriented to person, place, and time. She appears well-developed and well-nourished.  HENT:  Head: Normocephalic and atraumatic.  Eyes: Conjunctivae and EOM are normal. Pupils are equal, round, and reactive to light.  Neck: Normal range of motion. Neck supple.  Cardiovascular: Normal rate, regular rhythm and intact distal pulses.   Pulmonary/Chest: Effort normal.  Abdominal: Soft.  Musculoskeletal: She exhibits tenderness (Right wrist tender over radial styloid area and she has two very small grape seed sized ganglion cysts present, ROM full, Grip normal, NV intact.  Left wrist negative.).  Neurological: She is alert and oriented to person, place, and time. She displays normal reflexes. No cranial nerve deficit. She exhibits normal muscle tone. Coordination normal.  Skin: Skin is warm and dry.  Psychiatric: She has a normal mood and affect. Her behavior is normal. Judgment and thought content normal.   X-rays were done of the right wrist and reported separately.      Assessment & Plan:   Encounter Diagnoses  Name Primary?  . Right wrist pain Yes  . Ganglion cyst of wrist, right    I have gone over the x-rays with her.  I have asked that she just observe and watch the cysts.  If they get bigger, aspiration can be attempted.  They are just too small to do that today.  Often the cyst may disappear.   Surgery is a possibility if the cysts get large and recur after aspiration.  She is agreeable to this approach.  Call if any problem.  Precautions discussed.  Return in three weeks.  Electronically Signed Sanjuana Kava, MD 8/24/20179:19 PM

## 2016-04-29 ENCOUNTER — Encounter: Payer: Self-pay | Admitting: Orthopaedic Surgery

## 2016-04-29 ENCOUNTER — Ambulatory Visit (INDEPENDENT_AMBULATORY_CARE_PROVIDER_SITE_OTHER): Payer: 59 | Admitting: Orthopaedic Surgery

## 2016-04-29 VITALS — BP 135/68 | HR 81 | Temp 97.3°F | Ht 65.0 in | Wt 137.0 lb

## 2016-04-29 DIAGNOSIS — M25531 Pain in right wrist: Secondary | ICD-10-CM | POA: Diagnosis not present

## 2016-04-29 DIAGNOSIS — M67431 Ganglion, right wrist: Secondary | ICD-10-CM | POA: Diagnosis not present

## 2016-04-29 NOTE — Progress Notes (Signed)
[name], female DOB:April 11, 1961, 55 y.o. [id number]  Chief Complaint  Patient presents with  . Wrist Problem    right wrist cyst    HPI  Yvette Joyce is a 55 y.o. female who has ganglion cyst of the right wrist area over the radial artery area.  The cysts gets big then goes away.  It varies.  It bothers her but she has no numbness.  She says it has "gone away" the last several days and it is not there today.  I have told her to come in when it is larger and I can attempt an aspiration of it then.  She is agreeable to this.  HPI  Body mass index is 22.8 kg/m.  ROS  Review of Systems  HENT: Negative for congestion.   Respiratory: Negative for cough and shortness of breath.   Cardiovascular: Negative for chest pain and leg swelling.  Endocrine: Positive for cold intolerance.  Musculoskeletal: Positive for arthralgias.  Allergic/Immunologic: Positive for environmental allergies.  Neurological: Positive for headaches.  Psychiatric/Behavioral: Positive for sleep disturbance.    Past Medical History:  Diagnosis Date  . Adult attention deficit disorder   . Arthritis   . Complication of anesthesia    "hard to wake up"  . Constipation   . Fatigue 03/07/2015  . Headache(784.0)   . Hematuria 04/12/2013  . Hypothyroidism   . Postmenopause 03/07/2015  . Sleep disturbance 03/07/2015  . Snoring   . Thyroid disease     Past Surgical History:  Procedure Laterality Date  . ABDOMINAL HYSTERECTOMY    . COLONOSCOPY  09/24/2011   [id number] polyps in the ascending colon/diverticula in the sigmoid colon/internal hemorrhoids  . cosmetic eye surgery    . fibroids removed from uterus    . HYSTEROSCOPY W/D&C  12/03/2011   Procedure: DILATATION AND CURETTAGE /HYSTEROSCOPY;  Surgeon: Jonnie Kind, MD;  Location: [facility];  Service: Gynecology;  Laterality: N/A;  . KNEE ARTHROSCOPY W/ DEBRIDEMENT    . LAPAROSCOPIC SALPINGO OOPHERECTOMY  07/11/2012   Procedure: LAPAROSCOPIC  SALPINGO OOPHORECTOMY;  Surgeon: Jonnie Kind, MD;  Location: [facility];  Service: Gynecology;  Laterality: Bilateral;  . LAPAROSCOPIC SUPRACERVICAL HYSTERECTOMY  07/11/2012   Procedure: LAPAROSCOPIC SUPRACERVICAL HYSTERECTOMY;  Surgeon: Jonnie Kind, MD;  Location: [facility];  Service: Gynecology;  Laterality: N/A;  . LASIK    . NASAL SEPTUM SURGERY    . POLYPECTOMY  12/03/2011   Procedure: POLYPECTOMY;  Surgeon: Jonnie Kind, MD;  Location: [facility];  Service: Gynecology;  Laterality: N/A;  resection of polyp  . VEIN SURGERY     Varicose veins    Family History  Problem Relation Age of Onset  . Colon cancer Maternal Uncle   . Cancer Sister   . Thyroid disease Sister   . Heart disease Father   . Hypertension Father   . Stroke Mother   . Asthma Mother   . Hypertension Mother     Social History Social History  Substance Use Topics  . Smoking status: Never Smoker  . Smokeless tobacco: Never Used  . Alcohol use 0.6 oz/week    1 Shots of liquor per week     Comment: 1-2 times a week    No Known Allergies  Current Outpatient Prescriptions  Medication Sig Dispense Refill  . Armodafinil 250 MG tablet Take 1 tablet (250 mg total) by mouth daily. 30 tablet 5  . Cholecalciferol (VITAMIN D PO) Take 1 tablet by mouth daily. Unsure of  strength    . fish oil-omega-3 fatty acids 1000 MG capsule Take 2 g by mouth daily.    Marland Kitchen ibuprofen (ADVIL,MOTRIN) 800 MG tablet Take 1 tablet (800 mg total) by mouth every 8 (eight) hours as needed. 60 tablet 1  . levothyroxine (SYNTHROID, LEVOTHROID) 100 MCG tablet TAKE 1 TABLET BY MOUTH DAILY BEFORE BREAKFAST. 30 tablet 6  . Multiple Vitamin (MULITIVITAMIN WITH MINERALS) TABS Take 1 tablet by mouth daily.    . polyethylene glycol powder (GLYCOLAX/MIRALAX) powder Take 17 g by mouth daily. 255 g 0  . XIIDRA 5 % SOLN Place into both eyes 2 (two) times daily.      No current facility-administered medications for this visit.      Physical Exam  Blood  pressure 135/68, pulse 81, temperature 97.3 F (36.3 C), height 5\' 5"  (1.651 m), weight 137 lb (62.1 kg), last menstrual period 11/25/2011.  Constitutional: overall normal hygiene, normal nutrition, well developed, normal grooming, normal body habitus. Assistive device:none  Musculoskeletal: gait and station Limp none, muscle tone and strength are normal, no tremors or atrophy is present.  .  Neurological: coordination overall normal.  Deep tendon reflex/nerve stretch intact.  Sensation normal.  Cranial nerves II-XII intact.   Skin:   Normal overall no scars, lesions, ulcers or rashes. No psoriasis.  Psychiatric: Alert and oriented x 3.  Recent memory intact, remote memory unclear.  Normal mood and affect. Well groomed.  Good eye contact.  Cardiovascular: overall no swelling, no varicosities, no edema bilaterally, normal temperatures of the legs and arms, no clubbing, cyanosis and good capillary refill.  Lymphatic: palpation is normal.  She has slight tenderness of the right dominant wrist over the radial artery area but I cannot appreciate a ganglion cyst there today.  NV intact.  Grips are normal.  The patient has been educated about the nature of the problem(s) and counseled on treatment options.  The patient appeared to understand what I have discussed and is in agreement with it.  Encounter Diagnoses  Name Primary?  . Right wrist pain Yes  . Ganglion cyst of wrist, right     PLAN Call if any problems.  Precautions discussed.  Continue current medications.   Return to clinic prn   Electronically Signed Sanjuana Kava, MD 9/14/201710:39 AM

## 2016-05-03 ENCOUNTER — Ambulatory Visit (INDEPENDENT_AMBULATORY_CARE_PROVIDER_SITE_OTHER): Payer: 59 | Admitting: Otolaryngology

## 2016-05-03 DIAGNOSIS — J310 Chronic rhinitis: Secondary | ICD-10-CM | POA: Diagnosis not present

## 2016-05-03 DIAGNOSIS — R490 Dysphonia: Secondary | ICD-10-CM | POA: Diagnosis not present

## 2016-05-03 DIAGNOSIS — G500 Trigeminal neuralgia: Secondary | ICD-10-CM

## 2016-05-10 ENCOUNTER — Ambulatory Visit (INDEPENDENT_AMBULATORY_CARE_PROVIDER_SITE_OTHER): Payer: 59 | Admitting: Neurology

## 2016-05-10 ENCOUNTER — Encounter: Payer: Self-pay | Admitting: Neurology

## 2016-05-10 VITALS — BP 148/80 | HR 80 | Resp 14 | Ht 65.0 in | Wt 142.0 lb

## 2016-05-10 DIAGNOSIS — G4726 Circadian rhythm sleep disorder, shift work type: Secondary | ICD-10-CM | POA: Diagnosis not present

## 2016-05-10 DIAGNOSIS — G4710 Hypersomnia, unspecified: Secondary | ICD-10-CM

## 2016-05-10 DIAGNOSIS — F419 Anxiety disorder, unspecified: Secondary | ICD-10-CM | POA: Diagnosis not present

## 2016-05-10 DIAGNOSIS — G500 Trigeminal neuralgia: Secondary | ICD-10-CM

## 2016-05-10 MED ORDER — ALPRAZOLAM 0.5 MG PO TABS: ORAL_TABLET | ORAL | 0 refills | Status: DC

## 2016-05-10 MED ORDER — CARBAMAZEPINE 100 MG PO CHEW
100.0000 mg | CHEWABLE_TABLET | Freq: Two times a day (BID) | ORAL | 5 refills | Status: DC
Start: 2016-05-10 — End: 2016-09-07

## 2016-05-10 NOTE — Patient Instructions (Addendum)
We will do 2 brain scans, called MRI and MRA and we will call you with the test results. We will have to schedule you for this on a separate date. This test requires authorization from your insurance, and we will take care of the insurance process.  We will continue carbamazepine 100 mg 2 times a day.  Keep your appointment next year.

## 2016-05-10 NOTE — Progress Notes (Signed)
Subjective:    Patient ID: Yvette Joyce is a 55 y.o. female.  HPI     Interim history:   Dear Dr. Seward Grater,   I saw your patient, Yvette Joyce, upon your kind request in my neurologic clinic today for initial consultation for concern for trigeminal neuralgia. The patient is unaccompanied today. As you know, Ms. Mccutcheon is a 55 year old right-handed woman with an underlying medical history of allergic rhinitis, hypothyroidism, ADHD and idiopathic hypersomnolence who have been following for her sleep disorder. She now presents with a history of a L facial pain, which started about 3 weeks ago.   I last saw her on 03/22/2016, at which time she reported that she had been off of Adderall for the past 3 months but was not doing as well as before. Chest being on Nuvigil was not helpful as much. She had taken herself off of the Adderall in the hope of getting by without it. She had also more issues with attention and focus and misplacing things.  She had some knee pain. She was tolerating the Nuvigil.  Today, 05/10/2016: She reports a fall in March 2017, knee gave out, while walking her dog, fell on cement, broke her sun glasses and sprained her right ankle, fell on L side of face and teeth touched the dirt. She went to see dentist, felt sore in the teeth, but no injuries were seen, no loose crowns. She had a tooth X ray on 04/10/16, after she saw Dr. Loyal Gambler. She has a short lived severe lightning like pain, sometimes burning, seems to be triggered by cold air, opening mouth or sudden turn of head. Of note, the pain severity was very severe but very brief, not easy to locate exactly if it's closer to upper jaw or lower jaw but severe enough to make her cry she states. Saw Dr. Marcene Duos who is a friend of the family, and was told this could be TGN and she was prescribed carbamazepine 100 mg bid. She feels improved. Of note, she saw her ENT, Dr. Benjamine Mola recently tool. Of note, she had an MRI brain w/wo contrast on  10/10/09: IMPRESSION: Abnormal, asymmetric fluid collection in the right petrous apex as compared to the left; the lesion displays a prolonged T2, an intermediate to short T1, displays peripheral greater than central post contrast enhancement, and could represent an inflammatory process.  CT temporal bone recommend further evaluation.   No acute or focal intracranial findings.   Previously:   I saw her on 09/22/2015, at which time she reported that the increase in Nuvigil helped. She felt much improved. She was more alert at work and had no side effects. She stopped using her energy drinks that she was utilizing before. In fact, she also felt she could do without Adderall. She was in the process of reducing the Adderall. She had recent blood work with her PCP. She had a borderline cholesterol. We mutually agreed to continue with Nuvigil 250 mg once daily before she would go to work. We talked about her sleep study results at the time. She had a mean sleep latency of 12.1 minute, but did achieve REM sleep in 2 out of 4 naps, nap #1 and 2, in the context of decreased REM percentage and delayed REM onset during the nighttime sleep study, findings in conjunction with her clinical history suggestive of delayed sleep phase syndrome and shift work sleep disorder.    I saw her on 06/23/2015, at which time we talked about  her PSG and MSLT from 06/08/2015 as well as 06/09/2015, respectively. She had no significant sleep disordered breathing. She had mild PLMS during the nighttime sleep study without significant arousals. She had no significant RLS symptoms. Her nap study showed a mean sleep latency of 12.1 minutes as well as to REM onset naps out of 4 naps, in nap #1 and 2. She gave no telltale history of narcolepsy and her nighttime sleep study showed a decreased percentage of REM sleep in a prolonged REM latency as well as an increased percentage of light stage sleep. I felt that in light of her shift work  disorder this was most likely a circadian rhythm disorder of his shift work type. She was advised to try Nuvigil 150 mg strength once daily before going to work. She was also advised to try to bright light therapy when she first wakes up and melatonin which he goes to bed. On 07/15/15 she called and reported residual sleepiness. We increased her Nuvigil to 250 mg once daily.     I first met her on 05/12/2015 at the request of her primary care provider, at which time she reported snoring, excessive daytime somnolence, nonrestorative sleep and shift work for 6 years. I invited her back for sleep study at night, followed by a daytime MSLT. She had a baseline sleep study on 06/08/2015 and a next a nap study on 06/09/2015 and I went over her test results with her in detail today. Her nighttime sleep study showed a sleep efficiency of reduced at 77.7% with a normal sleep latency of 16.5 minutes and wake after sleep onset of 88.5 minutes with mild to moderate sleep fragmentation noted. The arousal index was 13.8 per hour, primarily secondary to spontaneous arousals. She had an increased percentage of stage I and stage II sleep at 14.1 and 73.5% respectively, absence of slow-wave sleep and a decreased percentage of REM sleep at 12.4% with a prolonged REM latency of 164 minutes. She had mild PLMS with an index of 6.2 per hour, resulting in 1.5 arousals per hour. She had no significant EKG changes. She had mild intermittent snoring. Total AHI was 1 per hour, average oxygen saturation was 95%, nadir was 90%. She had a next a MSLT on 06/09/2015 which showed a mean sleep latency of 12.5 minutes indicating mild sleepiness. REM sleep was noted during nap #1 and 2. I felt that her studies did not support the diagnosis of narcolepsy or idiopathic hypersomnolence based on a mean sleep latency of over 8 minutes, the fact that she had decreased REM sleep and delay and REM sleep during the nighttime study and the fact that she had REM  sleep during nap #1 and 2 only, would support a circadian rhythm disorder such as delayed sleep phase syndrome, and could reflect shift work disorder based on her work schedule and sleep schedule typically.    05/12/2015: She reports snoring and excessive daytime somnolence. She has not felt rested in years, she can fall asleep anytime and fall asleep at the drop of a hat. She does not need to have the TV on at night. She works from 4 PM to 2 AM, M-Th, off on Friday through Sun. She has had this work schedule for 6 years. She works in [employer], running a machine, never had issues performing at works, never fallen asleep at the wheel. She denies episodes of sleep paralysis, hypnagogic or hypnopompic hallucinations, or cataplexy. She feels that she rarely dreams. When she dreams  these are not good dreams. She denies restless leg symptoms. She is a restless sleeper. She does not wake up rested. She has had sleepiness during the day for years. She goes to bed around 3 AM but may not fall asleep until 4 or 4:30. Rise time is between 10 AM and noon. On Fridays and Saturdays as well as Sundays she goes to bed around 11 PM. She does not have a family history of obstructive sleep apnea. She has allergy symptoms. She had nasal surgery years ago for deviated septum. She denies morning headaches. She has nocturia, typically once or twice per night. Her Epworth sleepiness score is 14 out of 24 today, her fatigue scores 46 out of 63. She does not smoke. She drinks alcohol occasionally. She drinks caffeine in the form of 5 hour energy drinks once a day. She has been on Adderall XR 20 mg once daily for about a year. It has helped her concentration but not necessarily her sleepiness. She reports memory issues. She feels that she is becoming forgetful. She has a family history of heart disease and stroke. She lives alone. Her boyfriend has commented on her snoring that has not told her that she has pauses in her  breathing. She has woken herself up with a sense of gagging at times. I reviewed your office note from 03/21/15, which you kindly included. She had blood work in July through her [place], and her synthroid was increased.      Her Past Medical History Is Significant For: Past Medical History:  Diagnosis Date  . Adult attention deficit disorder   . Arthritis   . Complication of anesthesia    "hard to wake up"  . Constipation   . Fatigue 03/07/2015  . Headache(784.0)   . Hematuria 04/12/2013  . Hypothyroidism   . Postmenopause 03/07/2015  . Sleep disturbance 03/07/2015  . Snoring   . Thyroid disease     Her Past Surgical History Is Significant For: Past Surgical History:  Procedure Laterality Date  . ABDOMINAL HYSTERECTOMY    . COLONOSCOPY  09/24/2011   [facility]:HENIDPO polyps in the ascending colon/diverticula in the sigmoid colon/internal hemorrhoids  . cosmetic eye surgery    . fibroids removed from uterus    . HYSTEROSCOPY W/D&C  12/03/2011   Procedure: DILATATION AND CURETTAGE /HYSTEROSCOPY;  Surgeon: Jonnie Kind, MD;  Location: [facility];  Service: Gynecology;  Laterality: N/A;  . KNEE ARTHROSCOPY W/ DEBRIDEMENT    . LAPAROSCOPIC SALPINGO OOPHERECTOMY  07/11/2012   Procedure: LAPAROSCOPIC SALPINGO OOPHORECTOMY;  Surgeon: Jonnie Kind, MD;  Location: [facility];  Service: Gynecology;  Laterality: Bilateral;  . LAPAROSCOPIC SUPRACERVICAL HYSTERECTOMY  07/11/2012   Procedure: LAPAROSCOPIC SUPRACERVICAL HYSTERECTOMY;  Surgeon: Jonnie Kind, MD;  Location: [facility];  Service: Gynecology;  Laterality: N/A;  . LASIK    . NASAL SEPTUM SURGERY    . POLYPECTOMY  12/03/2011   Procedure: POLYPECTOMY;  Surgeon: Jonnie Kind, MD;  Location: [facility];  Service: Gynecology;  Laterality: N/A;  resection of polyp  . VEIN SURGERY     Varicose veins    Her Family History Is Significant For: Family History  Problem Relation Age of Onset  . Colon cancer Maternal Uncle   . Cancer Sister   . Thyroid  disease Sister   . Heart disease Father   . Hypertension Father   . Stroke Mother   . Asthma Mother   . Hypertension Mother     Her Social History Is  Significant For: Social History   Social History  . Marital status: Divorced    Spouse name: N/A  . Number of children: 0  . Years of education: College   Social History Main Topics  . Smoking status: Never Smoker  . Smokeless tobacco: Never Used  . Alcohol use 0.6 oz/week    1 Shots of liquor per week     Comment: 1-2 times a week  . Drug use: No  . Sexual activity: Yes    Birth control/ protection: Surgical     Comment: supracervical hyst   Other Topics Concern  . None   Social History Narrative   Drinks about 5- 5 Hour Energ[employer] a week     Her Allergies Are:  No Known Allergies:   Her Current Medications Are:  Outpatient Encounter Prescriptions as of 05/10/2016  Medication Sig  . Armodafinil 250 MG tablet Take 1 tablet (250 mg total) by mouth daily.  . carbamazepine (TEGRETOL) 100 MG chewable tablet   . Cholecalciferol (VITAMIN D PO) Take 1 tablet by mouth daily. Unsure of strength  . fish oil-omega-3 fatty acids 1000 MG capsule Take 2 g by mouth daily.  Marland Kitchen ibuprofen (ADVIL,MOTRIN) 800 MG tablet Take 1 tablet (800 mg total) by mouth every 8 (eight) hours as needed.  Marland Kitchen levothyroxine (SYNTHROID, LEVOTHROID) 100 MCG tablet TAKE 1 TABLET BY MOUTH DAILY BEFORE BREAKFAST.  . Multiple Vitamin (MULITIVITAMIN WITH MINERALS) TABS Take 1 tablet by mouth daily.  . polyethylene glycol powder (GLYCOLAX/MIRALAX) powder Take 17 g by mouth daily.  Marland Kitchen XIIDRA 5 % SOLN Place into both eyes 2 (two) times daily.    No facility-administered encounter medications on file as of 05/10/2016.   :  Review of Systems:  Out of a complete 14 point review of systems, all are reviewed and negative with the exception of these symptoms as listed below: Review of Systems  Neurological:       Patient states that 3 weeks ago she started having pain in  her teeth on the L side.  H/O of hitting the L side of her face during fall this past March.  Started on Carbamazepine 152m BID which has helped her pain.     Objective:  Neurologic Exam  Physical Exam Physical Examination:   Vitals:   05/10/16 0913  BP: (!) 148/80  Pulse: 80  Resp: 14   General Examination: The patient is a very pleasant 55y.o. female in no acute distress. She appears well-developed and well-nourished and very well groomed. She is in good spirits today, as is her usual.    HEENT: Normocephalic, atraumatic, pupils are equal, round and reactive to light and accommodation. Extraocular tracking is good without limitation to gaze excursion or nystagmus noted. Normal smooth pursuit is noted. Hearing is grossly intact. Face is symmetric with normal facial animation and normal facial sensation. does not currently have any pain especially no pain triggered by mouth opening, talking, had turns at this time.  Speech is clear with no dysarthria noted. There is no hypophonia. There is no lip, neck/head, jaw or voice tremor. Neck is supple with full range of passive and active motion. There are no carotid bruits on auscultation. Oropharynx exam reveals: mild mouth dryness, good dental hygiene and mild airway crowding, due to narrow airway entry. Mallampati is class II. Tongue protrudes centrally and palate elevates symmetrically.   Chest: Clear to auscultation without wheezing, rhonchi or crackles noted.  Heart: S1+S2+0, regular and normal without murmurs, rubs or  gallops noted.   Abdomen: Soft, non-tender and non-distended with normal bowel sounds appreciated on auscultation.  Extremities: There is no pitting edema in the distal lower extremities bilaterally. Pedal pulses are intact.  Skin: Warm and dry without trophic changes noted. There are no varicose veins.  Musculoskeletal: exam reveals no obvious joint deformities, tenderness or joint swelling or erythema, with the  exception of mild left knee swelling and mild tenderness, appears stable.   Neurologically:  Mental status: The patient is awake, alert and oriented in all 4 spheres. Her immediate and remote memory, attention, language skills and fund of knowledge are appropriate. There is no evidence of aphasia, agnosia, apraxia or anomia. Speech is clear with normal prosody and enunciation. Thought process is linear. Mood is normal and affect is normal.  Cranial nerves II - XII are as described above under HEENT exam. In addition: shoulder shrug is normal with equal shoulder height noted. Motor exam: Normal bulk, strength and tone is noted. There is no drift, tremor or rebound. Romberg is negative. Reflexes are 2+ throughout. Fine motor skills and coordination: intact with normal finger taps, normal hand movements, normal rapid alternating patting, normal foot taps and normal foot agility.  Cerebellar testing: No dysmetria or intention tremor on finger to nose testing. Heel to shin is unremarkable on the right and slightly more difficult on the left secondary to knee stiffness reported. There is no truncal or gait ataxia.  Sensory exam: intact to light touch in the upper and lower extremities.  Gait, station and balance: She stands easily. No veering to one side is noted. No leaning to one side is noted. Posture is age-appropriate and stance is narrow based. Gait shows normal stride length and normal pace.    Assessment and Plan:   In summary, MINDIE RAWDON is a very pleasant 55 year old female with an underlying medical history of allergic rhinitis, hypothyroidism and ADHD, who presents forA new problem visit. She has had intermittent severe left-sided facial pain, started about 3 weeks ago, history in keeping with left trigeminal neuralgia, exam nonfocal. I have previously been following her for her hypersomnolence and circadian rhythm disorder, shift work type. She has been on Nuvigil for this and Adderall which  has been prescribed previously for her ADD. She has been on carbamazepine low dose, 100 mg twice daily which has helped quite significantly. I suggested we continue with this and I provided a refill on this. In addition, for diagnostic purposes I would like to proceed with an MRI brain with and without contrast and MRA head. We will call her with her test results. She is reassured as to her nonfocal neurological exam. She has an appointment pending with [place] which I asked her to keep. I answered all her questions today and she was in agreement.

## 2016-06-01 ENCOUNTER — Telehealth: Payer: Self-pay | Admitting: Orthopaedic Surgery

## 2016-06-01 ENCOUNTER — Telehealth: Payer: Self-pay | Admitting: Neurology

## 2016-06-01 NOTE — Telephone Encounter (Signed)
Pt called in to check MRI status. Order was put in 9/25. Please call pt to advise

## 2016-06-01 NOTE — Telephone Encounter (Signed)
Patient called to inquire about release of records of her two recent office visits and Xrays; states may try to get second opinion evaluation by hand specialist.  Discussed process of signing of release form; to come today to sign form for request to be processed.

## 2016-06-02 NOTE — Telephone Encounter (Signed)
Called patient and informed her that it had been sent to [facility] and she could call them to schedule. I gave their phone number to her.

## 2016-06-08 ENCOUNTER — Other Ambulatory Visit: Payer: Self-pay | Admitting: Adult Health

## 2016-06-08 NOTE — Telephone Encounter (Signed)
Patient has Mr Brain w/wo contrast & MR MRA head wo contrast  scheduled at [facility] on 06/18/16.

## 2016-06-17 DIAGNOSIS — M6740 Ganglion, unspecified site: Secondary | ICD-10-CM | POA: Insufficient documentation

## 2016-06-17 DIAGNOSIS — G5603 Carpal tunnel syndrome, bilateral upper limbs: Secondary | ICD-10-CM | POA: Insufficient documentation

## 2016-06-18 ENCOUNTER — Ambulatory Visit
Admission: RE | Admit: 2016-06-18 | Discharge: 2016-06-18 | Disposition: A | Discharge status: Home / Self Care | Payer: 59 | Source: Ambulatory Visit | Attending: Neurology | Admitting: Neurology

## 2016-06-18 DIAGNOSIS — G500 Trigeminal neuralgia: Secondary | ICD-10-CM

## 2016-06-18 MED ORDER — GADOBENATE DIMEGLUMINE 529 MG/ML IV SOLN: 20.0000 mL | Freq: Once | INTRAVENOUS | Status: DC | PRN

## 2016-06-18 MED ORDER — GADOBENATE DIMEGLUMINE 529 MG/ML IV SOLN
15.0000 mL | Freq: Once | INTRAVENOUS | Status: AC | PRN
  Administered 2016-06-18: 13 mL via INTRAVENOUS

## 2016-06-22 ENCOUNTER — Telehealth: Payer: Self-pay

## 2016-06-22 NOTE — Telephone Encounter (Signed)
That is a good question: she can still have TGN without any changes of irritation, swelling or inflammation noted on MRI. It is good to know that MRI is reassuring looking. We will continue with symptomatic management.

## 2016-06-22 NOTE — Telephone Encounter (Signed)
I spoke to patient and she is aware of information below. Voiced appreciation.

## 2016-06-22 NOTE — Progress Notes (Signed)
Please call and advise the patient that the recent scans we did were within normal limits. We did a brain MRI w and wo contrast and MRA, which showed: Normal brain blood arteries on the MRA and brain MRI stable from 2011. In particular, there were no acute findings, such as a stroke, or blood products. No further action is required on this test at this time. Please remind patient to keep any upcoming appointments or tests and to call us with any interim questions, concerns, problems or updates. Thanks,  Star Age, MD, PhD

## 2016-06-22 NOTE — Telephone Encounter (Signed)
-----   Message from Star Age, MD sent at 06/22/2016  1:55 PM EST ----- Please call and advise the patient that the recent scans we did were within normal limits. We did a brain MRI w and wo contrast and MRA, which showed: Normal brain blood arteries on the MRA and brain MRI stable from 2011. In particular, there were no acute findings, such as a stroke, or blood products. No further action is required on this test at this time. Please remind patient to keep any upcoming appointments or tests and to call us with any interim questions, concerns, problems or updates. Thanks,  Star Age, MD, PhD

## 2016-06-22 NOTE — Telephone Encounter (Signed)
I spoke to patient and she is aware of results. She wants to make sure that this means that she does not have trigeminal neuralgia? And if she does not have it, what could be the cause of her symptoms?

## 2016-07-16 HISTORY — PX: CARPAL TUNNEL RELEASE: SHX101

## 2016-07-30 ENCOUNTER — Other Ambulatory Visit: Payer: Self-pay

## 2016-08-17 DIAGNOSIS — M1712 Unilateral primary osteoarthritis, left knee: Secondary | ICD-10-CM | POA: Diagnosis not present

## 2016-08-17 DIAGNOSIS — R05 Cough: Secondary | ICD-10-CM | POA: Diagnosis not present

## 2016-08-17 DIAGNOSIS — J019 Acute sinusitis, unspecified: Secondary | ICD-10-CM | POA: Diagnosis not present

## 2016-08-25 ENCOUNTER — Other Ambulatory Visit: Payer: Self-pay | Admitting: Internal Medicine

## 2016-08-25 DIAGNOSIS — Z1231 Encounter for screening mammogram for malignant neoplasm of breast: Secondary | ICD-10-CM

## 2016-08-25 DIAGNOSIS — M25562 Pain in left knee: Secondary | ICD-10-CM | POA: Diagnosis not present

## 2016-08-25 DIAGNOSIS — M1712 Unilateral primary osteoarthritis, left knee: Secondary | ICD-10-CM | POA: Diagnosis not present

## 2016-08-26 ENCOUNTER — Encounter: Payer: Self-pay | Admitting: Physician Assistant

## 2016-08-26 DIAGNOSIS — M1712 Unilateral primary osteoarthritis, left knee: Secondary | ICD-10-CM

## 2016-08-26 DIAGNOSIS — G500 Trigeminal neuralgia: Secondary | ICD-10-CM | POA: Diagnosis present

## 2016-08-26 HISTORY — DX: Unilateral primary osteoarthritis, left knee: M17.12

## 2016-08-26 NOTE — H&P (Addendum)
TOTAL KNEE ADMISSION H&P  Patient is being admitted for left total knee arthroplasty.  Subjective:  Chief Complaint:left knee pain.  HPI: Yvette Joyce, 56 y.o. female, has a history of pain and functional disability in the left knee due to arthritis and has failed non-surgical conservative treatments for greater than 12 weeks to includeNSAID's and/or analgesics, corticosteriod injections, viscosupplementation injections, flexibility and strengthening excercises, supervised PT with diminished ADL's post treatment, weight reduction as appropriate and activity modification.  Onset of symptoms was gradual, starting 10 years ago with gradually worsening course since that time. The patient noted prior procedures on the knee to include  arthroscopy and menisectomy on the left knee(s).  Patient currently rates pain in the left knee(s) at 10 out of 10 with activity. Patient has night pain, worsening of pain with activity and weight bearing, pain that interferes with activities of daily living, crepitus and joint swelling.  Patient has evidence of subchondral sclerosis, periarticular osteophytes and joint space narrowing by imaging studies.  There is no active infection.  Patient Active Problem List   Diagnosis Date Noted  . Trigeminal neuralgia pain   . Fatigue 03/07/2015  . Sleep disturbance 03/07/2015  . Postmenopause 03/07/2015  . Hemorrhoids 11/13/2014  . Knee pain, acute 05/01/2014  . Left leg weakness 05/01/2014  . Hypothyroid 02/28/2014  . Dilated cbd, acquired 06/22/2013  . Hematuria 04/12/2013   Past Medical History:  Diagnosis Date  . Adult attention deficit disorder   . Arthritis   . Complication of anesthesia    "hard to wake up"  . Constipation   . Fatigue 03/07/2015  . Headache(784.0)   . Hematuria 04/12/2013  . Hypothyroidism   . Postmenopause 03/07/2015  . Sleep disturbance 03/07/2015  . Snoring   . Thyroid disease   . Trigeminal neuralgia pain     Past Surgical History:   Procedure Laterality Date  . ABDOMINAL HYSTERECTOMY    . COLONOSCOPY  09/24/2011   [id number] polyps in the ascending colon/diverticula in the sigmoid colon/internal hemorrhoids  . cosmetic eye surgery    . fibroids removed from uterus    . HYSTEROSCOPY W/D&C  12/03/2011   Procedure: DILATATION AND CURETTAGE /HYSTEROSCOPY;  Surgeon: Jonnie Kind, MD;  Location: [facility];  Service: Gynecology;  Laterality: N/A;  . KNEE ARTHROSCOPY W/ DEBRIDEMENT    . LAPAROSCOPIC SALPINGO OOPHERECTOMY  07/11/2012   Procedure: LAPAROSCOPIC SALPINGO OOPHORECTOMY;  Surgeon: Jonnie Kind, MD;  Location: [facility];  Service: Gynecology;  Laterality: Bilateral;  . LAPAROSCOPIC SUPRACERVICAL HYSTERECTOMY  07/11/2012   Procedure: LAPAROSCOPIC SUPRACERVICAL HYSTERECTOMY;  Surgeon: Jonnie Kind, MD;  Location: [facility];  Service: Gynecology;  Laterality: N/A;  . LASIK    . NASAL SEPTUM SURGERY    . POLYPECTOMY  12/03/2011   Procedure: POLYPECTOMY;  Surgeon: Jonnie Kind, MD;  Location: [facility];  Service: Gynecology;  Laterality: N/A;  resection of polyp  . VEIN SURGERY     Varicose veins    No current facility-administered medications for this encounter.   Current Outpatient Prescriptions:  .  ALPRAZolam (XANAX) 0.5 MG tablet, Take 1-2 pills as needed on call to MRI., Disp: 2 tablet, Rfl: 0 .  Armodafinil 250 MG tablet, Take 1 tablet (250 mg total) by mouth daily., Disp: 30 tablet, Rfl: 5 .  Ascorbic Acid (VITAMIN C) 1000 MG tablet, Take 1,000 mg by mouth daily., Disp: , Rfl:  .  Biotin 10000 MCG TABS, Take 10,000 Units by mouth daily., Disp: , Rfl:  .  carbamazepine (TEGRETOL) 100 MG chewable tablet, Chew 1 tablet (100 mg total) by mouth 2 (two) times daily., Disp: 60 tablet, Rfl: 5 .  carboxymethylcellulose (REFRESH PLUS) 0.5 % SOLN, Place 2 drops into both eyes 2 (two) times daily as needed (eye dryness)., Disp: , Rfl:  .  Cholecalciferol (VITAMIN D3) 2000 units TABS, Take 2,000 Units by mouth daily., Disp: ,  Rfl:  .  fish oil-omega-3 fatty acids 1000 MG capsule, Take 2 g by mouth daily., Disp: , Rfl:  .  levothyroxine (SYNTHROID, LEVOTHROID) 100 MCG tablet, TAKE 1 TABLET BY MOUTH DAILY BEFORE BREAKFAST., Disp: 30 tablet, Rfl: 3 .  VITAMIN A PO, Take 2,400 Units by mouth daily., Disp: , Rfl:  .  vitamin E 400 UNIT capsule, Take 400 Units by mouth daily., Disp: , Rfl:  .  ibuprofen (ADVIL,MOTRIN) 800 MG tablet, Take 1 tablet (800 mg total) by mouth every 8 (eight) hours as needed. (Patient not taking: Reported on 08/23/2016), Disp: 60 tablet, Rfl: 1 .  polyethylene glycol powder (GLYCOLAX/MIRALAX) powder, Take 17 g by mouth daily. (Patient taking differently: Take 17 g by mouth daily as needed for mild constipation. ), Disp: 255 g, Rfl: 0    No Known Allergies  Social History  Substance Use Topics  . Smoking status: Never Smoker  . Smokeless tobacco: Never Used  . Alcohol use 0.6 oz/week    1 Shots of liquor per week     Comment: 1-2 times a week    Family History  Problem Relation Age of Onset  . Colon cancer Maternal Uncle   . Cancer Sister   . Thyroid disease Sister   . Heart disease Father   . Hypertension Father   . Stroke Mother   . Asthma Mother   . Hypertension Mother      Review of Systems  Constitutional: Negative.   HENT: Positive for congestion and sinus pain.   Eyes: Negative.   Respiratory: Positive for cough and sputum production.   Cardiovascular: Negative.   Gastrointestinal: Negative.   Genitourinary: Negative.   Musculoskeletal: Positive for back pain and joint pain.  Skin: Negative.   Neurological: Negative.   Endo/Heme/Allergies: Negative.   Psychiatric/Behavioral: Negative.     Objective:  Physical Exam  Constitutional: She is oriented to person, place, and time. She appears well-developed and well-nourished.  HENT:  Head: Normocephalic and atraumatic.  Mouth/Throat: Oropharyngeal exudate present.  Eyes: Conjunctivae are normal. Pupils are equal,  round, and reactive to light.  Neck: Neck supple.  Cardiovascular: Normal rate and regular rhythm.   Respiratory: Effort normal.  GI: Soft. Bowel sounds are normal.  Genitourinary:  Genitourinary Comments: Not pertinent to current symptomatology therefore not examined.  Musculoskeletal:  Examination of her left knee reveals pain medially and laterally.  Moderate varus deformity.  1+ crepitation.  1+ synovitis.  Range of motion is from 0-120 degrees.  Knee is stable with normal patella tracking.  Examination of her right knee reveals full range of motion without pain, swelling, weakness or instability.  Vascular exam: Pulses are 2+ and symmetric.    Neurological: She is alert and oriented to person, place, and time.  Skin: Skin is warm and dry.  Psychiatric: She has a normal mood and affect.    Vital signs in last 24 hours:    Labs:   Estimated body mass index is 23.63 kg/m as calculated from the following:   Height as of 05/10/16: 5\' 5"  (1.651 m).   Weight as of 05/10/16:  64.4 kg (142 lb).   Imaging Review Plain radiographs demonstrate severe degenerative joint disease of the left knee(s). The overall alignment issignificant varus. The bone quality appears to be good for age and reported activity level.  Assessment/Plan:  End stage arthritis, left knee  Principal Problem:   Primary localized osteoarthritis of left knee Active Problems:   Hypothyroid   Sleep disturbance   Postmenopause   Trigeminal neuralgia pain  The patient history, physical examination, clinical judgment of the provider and imaging studies are consistent with end stage degenerative joint disease of the left knee(s) and total knee arthroplasty is deemed medically necessary. The treatment options including medical management, injection therapy arthroscopy and arthroplasty were discussed at length. The risks and benefits of total knee arthroplasty were presented and reviewed. The risks due to aseptic loosening,  infection, stiffness, patella tracking problems, thromboembolic complications and other imponderables were discussed. The patient acknowledged the explanation, agreed to proceed with the plan and consent was signed. Patient is being admitted for inpatient treatment for surgery, pain control, PT, OT, prophylactic antibiotics, VTE prophylaxis, progressive ambulation and ADL's and discharge planning. The patient is planning to be discharged home with home health services

## 2016-08-27 ENCOUNTER — Inpatient Hospital Stay (HOSPITAL_COMMUNITY)
Admission: RE | Admit: 2016-08-27 | Discharge: 2016-08-27 | Disposition: A | Discharge status: Home / Self Care | Payer: 59 | Source: Ambulatory Visit

## 2016-08-27 DIAGNOSIS — M79641 Pain in right hand: Secondary | ICD-10-CM | POA: Diagnosis not present

## 2016-08-27 DIAGNOSIS — M6740 Ganglion, unspecified site: Secondary | ICD-10-CM | POA: Diagnosis not present

## 2016-08-27 DIAGNOSIS — G5603 Carpal tunnel syndrome, bilateral upper limbs: Secondary | ICD-10-CM | POA: Diagnosis not present

## 2016-08-27 NOTE — Progress Notes (Signed)
Pt did not show for scheduled PAT appointment. When contacted, pt stated , " I had labs done at the office yesterday, the office should have called you, call the office."  [place], Surgical Coordinator, stated that she will fax lab results and call to have pt rescheduled.

## 2016-08-31 ENCOUNTER — Encounter (HOSPITAL_COMMUNITY): Payer: Self-pay

## 2016-08-31 ENCOUNTER — Ambulatory Visit (HOSPITAL_COMMUNITY)
Admission: RE | Admit: 2016-08-31 | Discharge: 2016-08-31 | Disposition: A | Discharge status: Home / Self Care | Payer: 59 | Source: Ambulatory Visit | Attending: Physician Assistant | Admitting: Physician Assistant

## 2016-08-31 ENCOUNTER — Encounter (HOSPITAL_COMMUNITY)
Admission: RE | Admit: 2016-08-31 | Discharge: 2016-08-31 | Disposition: A | Discharge status: Home / Self Care | Payer: 59 | Source: Ambulatory Visit | Attending: Orthopedic Surgery | Admitting: Orthopedic Surgery

## 2016-08-31 ENCOUNTER — Ambulatory Visit (HOSPITAL_COMMUNITY): Admission: RE | Admit: 2016-08-31 | Payer: 59 | Source: Ambulatory Visit

## 2016-08-31 DIAGNOSIS — Z01818 Encounter for other preprocedural examination: Secondary | ICD-10-CM | POA: Diagnosis not present

## 2016-08-31 DIAGNOSIS — Z01812 Encounter for preprocedural laboratory examination: Secondary | ICD-10-CM | POA: Diagnosis present

## 2016-08-31 DIAGNOSIS — M4185 Other forms of scoliosis, thoracolumbar region: Secondary | ICD-10-CM | POA: Diagnosis not present

## 2016-08-31 DIAGNOSIS — J069 Acute upper respiratory infection, unspecified: Secondary | ICD-10-CM

## 2016-08-31 DIAGNOSIS — Z01810 Encounter for preprocedural cardiovascular examination: Secondary | ICD-10-CM | POA: Diagnosis present

## 2016-08-31 LAB — TYPE AND SCREEN
ABO/RH(D): AB POS
Antibody Screen: NEGATIVE

## 2016-08-31 LAB — COMPREHENSIVE METABOLIC PANEL
ALT: 24 U/L (ref 14–54)
ANION GAP: 9 (ref 5–15)
AST: 28 U/L (ref 15–41)
Albumin: 3.7 g/dL (ref 3.5–5.0)
Alkaline Phosphatase: 124 U/L (ref 38–126)
BUN: 9 mg/dL (ref 6–20)
CO2: 28 mmol/L (ref 22–32)
Calcium: 9.3 mg/dL (ref 8.9–10.3)
Chloride: 104 mmol/L (ref 101–111)
Creatinine, Ser: 0.68 mg/dL (ref 0.44–1.00)
GFR calc non Af Amer: >60 mL/min (ref >60)
Glucose, Bld: 99 mg/dL (ref 65–99)
POTASSIUM: 4 mmol/L (ref 3.5–5.1)
SODIUM: 141 mmol/L (ref 135–145)
Total Bilirubin: 0.7 mg/dL (ref 0.3–1.2)
Total Protein: 6.5 g/dL (ref 6.5–8.1)

## 2016-08-31 LAB — CBC
HEMATOCRIT: 43.5 % (ref 36.0–46.0)
HEMOGLOBIN: 14 g/dL (ref 12.0–15.0)
MCH: 31 pg (ref 26.0–34.0)
MCHC: 32.2 g/dL (ref 30.0–36.0)
MCV: 96.5 fL (ref 78.0–100.0)
Platelets: 191 10*3/uL (ref 150–400)
RBC: 4.51 MIL/uL (ref 3.87–5.11)
RDW: 13 % (ref 11.5–15.5)
WBC: 4.2 10*3/uL (ref 4.0–10.5)

## 2016-08-31 LAB — SURGICAL PCR SCREEN
MRSA, PCR: NEGATIVE
STAPHYLOCOCCUS AUREUS: NEGATIVE

## 2016-08-31 LAB — ABO/RH: ABO/RH(D): AB POS

## 2016-08-31 MED ORDER — CEFAZOLIN SODIUM-DEXTROSE 2-4 GM/100ML-% IV SOLN: 2.0000 g | INTRAVENOUS | Status: DC

## 2016-08-31 NOTE — Pre-Procedure Instructions (Signed)
    Yvette Joyce  08/31/2016      [facility] - [place], [place] [address] Phone: [phone] Fax: [phone]    Your procedure is scheduled on Monday, September 06, 2016 at 10:00  Report to [facility] at 8:00A.M.  Call this number if you have problems the morning of surgery:  [phone]   Remember:  Do not eat food or drink liquids after midnight Tuesday, September 07, 2016  Take these medicines the morning of surgery with A SIP OF WATER :, levothyroxine (SYNTHROID), if needed: eye drops for dry eyes Stop taking Aspirin, vitamins, fish oil and herbal medications. Do not take any NSAIDs ie: Ibuprofen, Advil, Naproxen, BC and Goody Powder  or any medication containing Aspirin; stop Wednesday, January 17  Do not wear jewelry, make-up or nail polish.  Do not wear lotions, powders, or perfumes, or deoderant.  Do not shave 48 hours prior to surgery.    Do not bring valuables to the hospital.  [facility] is not responsible for any belongings or valuables.  Contacts, dentures or bridgework may not be worn into surgery.  Leave your suitcase in the car.  After surgery it may be brought to your room.  For patients admitted to the hospital, discharge time will be determined by your treatment team. Special instructions: Shower the night before surgery and the morning of surgery with CHG. Please read over the following fact sheets that you were given. Pain Booklet, Coughing and Deep Breathing, Blood Transfusion Information, Total Joint Packet, MRSA Information and Surgical Site Infection Prevention

## 2016-09-02 DIAGNOSIS — M6740 Ganglion, unspecified site: Secondary | ICD-10-CM | POA: Diagnosis not present

## 2016-09-02 DIAGNOSIS — M65311 Trigger thumb, right thumb: Secondary | ICD-10-CM | POA: Diagnosis not present

## 2016-09-02 DIAGNOSIS — M654 Radial styloid tenosynovitis [de Quervain]: Secondary | ICD-10-CM | POA: Diagnosis not present

## 2016-09-02 DIAGNOSIS — G5603 Carpal tunnel syndrome, bilateral upper limbs: Secondary | ICD-10-CM | POA: Diagnosis not present

## 2016-09-03 NOTE — Progress Notes (Signed)
Anesthesia Chart Review: Patient is a 56 year old female scheduled for left TKA on 09/06/16 by Dr. Noemi Chapel.  History includes never smoker, hypothyroidism, adult ADD, snoring, tirgeminal neuralgia, fatigue, cosmetic eye surgery, nasal septal surgery, hematuria '14, vein surgery, laparoscopic supracervical hysterectomy '13. Reports she is "hard to wake up" after anesthesia.  PCP is listed as Dr. Redmond School.  BP 140/69   Pulse 73   Temp 36.7 C (Oral)   Resp 18   Ht 5\' 5"  (1.651 m)   Wt 153 lb 4 oz (69.5 kg)   LMP 11/25/2011   SpO2 100%   BMI 25.50 kg/m   Meds include Xanax, Tegretol, fish oil omega-3 fatty acids, levothyroxine, armodafinil.  EKG 08/31/16: Normal sinus rhythm, junctional ST depression, probably normal. Since last tracing anterior infarct is no longer present.  CXR 08/31/16: IMPRESSION: 1. No active lung disease.  Slight hyper aeration. 2. No significant change in mild to moderate thoracolumbar scoliosis.  Preoperative labs noted. I do not see that a urine culture was ordered. I spoke with Matthew Saras, PA-C she wants a urine culture sent when foley catheter is placed on the day of surgery.   If no acute changes then I would anticipate that she could proceed as planned.  George Hugh [facility]/Anesthesiology Phone [phone] 09/03/2016 1:36 PM

## 2016-09-06 ENCOUNTER — Encounter (HOSPITAL_COMMUNITY)
Admission: RE | Disposition: A | Discharge status: Home Health | Payer: Self-pay | Source: Ambulatory Visit | Attending: Orthopedic Surgery

## 2016-09-06 ENCOUNTER — Inpatient Hospital Stay (HOSPITAL_COMMUNITY): Payer: 59 | Admitting: Anesthesiology

## 2016-09-06 ENCOUNTER — Encounter (HOSPITAL_COMMUNITY): Payer: Self-pay | Admitting: *Deleted

## 2016-09-06 ENCOUNTER — Inpatient Hospital Stay (HOSPITAL_COMMUNITY): Payer: 59 | Admitting: Vascular Surgery

## 2016-09-06 ENCOUNTER — Inpatient Hospital Stay (HOSPITAL_COMMUNITY): Payer: 59

## 2016-09-06 ENCOUNTER — Inpatient Hospital Stay (HOSPITAL_COMMUNITY)
Admission: RE | Admit: 2016-09-06 | Discharge: 2016-09-07 | DRG: 470 | Disposition: A | Discharge status: Home Health | Payer: 59 | Source: Ambulatory Visit | Attending: Orthopedic Surgery | Admitting: Orthopedic Surgery

## 2016-09-06 DIAGNOSIS — Z823 Family history of stroke: Secondary | ICD-10-CM | POA: Diagnosis not present

## 2016-09-06 DIAGNOSIS — G500 Trigeminal neuralgia: Secondary | ICD-10-CM | POA: Diagnosis present

## 2016-09-06 DIAGNOSIS — Z96652 Presence of left artificial knee joint: Secondary | ICD-10-CM

## 2016-09-06 DIAGNOSIS — Z800 Family history of malignant neoplasm of digestive organs: Secondary | ICD-10-CM | POA: Diagnosis not present

## 2016-09-06 DIAGNOSIS — Z90710 Acquired absence of both cervix and uterus: Secondary | ICD-10-CM

## 2016-09-06 DIAGNOSIS — E039 Hypothyroidism, unspecified: Secondary | ICD-10-CM | POA: Diagnosis present

## 2016-09-06 DIAGNOSIS — G4726 Circadian rhythm sleep disorder, shift work type: Secondary | ICD-10-CM

## 2016-09-06 DIAGNOSIS — Z780 Asymptomatic menopausal state: Secondary | ICD-10-CM

## 2016-09-06 DIAGNOSIS — Z8349 Family history of other endocrine, nutritional and metabolic diseases: Secondary | ICD-10-CM

## 2016-09-06 DIAGNOSIS — Z8249 Family history of ischemic heart disease and other diseases of the circulatory system: Secondary | ICD-10-CM | POA: Diagnosis not present

## 2016-09-06 DIAGNOSIS — G4710 Hypersomnia, unspecified: Secondary | ICD-10-CM

## 2016-09-06 DIAGNOSIS — F419 Anxiety disorder, unspecified: Secondary | ICD-10-CM

## 2016-09-06 DIAGNOSIS — G8918 Other acute postprocedural pain: Secondary | ICD-10-CM | POA: Diagnosis not present

## 2016-09-06 DIAGNOSIS — M1712 Unilateral primary osteoarthritis, left knee: Principal | ICD-10-CM | POA: Diagnosis present

## 2016-09-06 DIAGNOSIS — Z825 Family history of asthma and other chronic lower respiratory diseases: Secondary | ICD-10-CM | POA: Diagnosis not present

## 2016-09-06 DIAGNOSIS — R319 Hematuria, unspecified: Secondary | ICD-10-CM | POA: Diagnosis not present

## 2016-09-06 DIAGNOSIS — G479 Sleep disorder, unspecified: Secondary | ICD-10-CM | POA: Diagnosis present

## 2016-09-06 DIAGNOSIS — K649 Unspecified hemorrhoids: Secondary | ICD-10-CM | POA: Diagnosis not present

## 2016-09-06 DIAGNOSIS — Z471 Aftercare following joint replacement surgery: Secondary | ICD-10-CM | POA: Diagnosis not present

## 2016-09-06 DIAGNOSIS — M25562 Pain in left knee: Secondary | ICD-10-CM | POA: Diagnosis not present

## 2016-09-06 HISTORY — DX: Trigeminal neuralgia: G50.0

## 2016-09-06 HISTORY — DX: Unilateral primary osteoarthritis, left knee: M17.12

## 2016-09-06 HISTORY — PX: TOTAL KNEE ARTHROPLASTY: SHX125

## 2016-09-06 SURGERY — ARTHROPLASTY, KNEE, TOTAL
Anesthesia: General | Site: Knee | Laterality: Left

## 2016-09-06 MED ORDER — DEXAMETHASONE SODIUM PHOSPHATE 10 MG/ML IJ SOLN
INTRAMUSCULAR | Status: DC | PRN
  Administered 2016-09-06: 10 mg via INTRAVENOUS

## 2016-09-06 MED ORDER — ACETAMINOPHEN 500 MG PO TABS
1000.0000 mg | ORAL_TABLET | Freq: Four times a day (QID) | ORAL | Status: AC
  Administered 2016-09-06 – 2016-09-07 (×3): 1000 mg via ORAL
  Filled 2016-09-06 (×3): qty 2

## 2016-09-06 MED ORDER — BUPIVACAINE-EPINEPHRINE 0.5% -1:200000 IJ SOLN
INTRAMUSCULAR | Status: DC | PRN
  Administered 2016-09-06: 30 mL

## 2016-09-06 MED ORDER — EPINEPHRINE PF 1 MG/ML IJ SOLN
INTRAMUSCULAR | Status: AC
  Filled 2016-09-06: qty 1

## 2016-09-06 MED ORDER — HYDROMORPHONE HCL 2 MG/ML IJ SOLN
1.0000 mg | INTRAMUSCULAR | Status: DC | PRN
  Administered 2016-09-06 – 2016-09-07 (×2): 1 mg via INTRAVENOUS
  Filled 2016-09-06 (×2): qty 1

## 2016-09-06 MED ORDER — ONDANSETRON HCL 4 MG/2ML IJ SOLN
4.0000 mg | Freq: Four times a day (QID) | INTRAMUSCULAR | Status: DC | PRN
  Administered 2016-09-06: 4 mg via INTRAVENOUS
  Filled 2016-09-06: qty 2

## 2016-09-06 MED ORDER — OXYCODONE HCL 5 MG PO TABS
5.0000 mg | ORAL_TABLET | ORAL | Status: DC | PRN
  Administered 2016-09-06 (×2): 10 mg via ORAL
  Administered 2016-09-07 (×2): 5 mg via ORAL
  Filled 2016-09-06 (×2): qty 1
  Filled 2016-09-06: qty 2

## 2016-09-06 MED ORDER — CEFAZOLIN SODIUM-DEXTROSE 2-4 GM/100ML-% IV SOLN
2.0000 g | Freq: Four times a day (QID) | INTRAVENOUS | Status: AC
  Administered 2016-09-06 (×2): 2 g via INTRAVENOUS
  Filled 2016-09-06 (×2): qty 100

## 2016-09-06 MED ORDER — FENTANYL CITRATE (PF) 100 MCG/2ML IJ SOLN
INTRAMUSCULAR | Status: AC
  Filled 2016-09-06: qty 2

## 2016-09-06 MED ORDER — LIDOCAINE 2% (20 MG/ML) 5 ML SYRINGE
INTRAMUSCULAR | Status: DC | PRN
  Administered 2016-09-06: 100 mg via INTRAVENOUS

## 2016-09-06 MED ORDER — PHENOL 1.4 % MT LIQD: 1.0000 | OROMUCOSAL | Status: DC | PRN

## 2016-09-06 MED ORDER — ROCURONIUM BROMIDE 10 MG/ML (PF) SYRINGE
PREFILLED_SYRINGE | INTRAVENOUS | Status: DC | PRN
  Administered 2016-09-06: 40 mg via INTRAVENOUS

## 2016-09-06 MED ORDER — HYDROMORPHONE HCL 1 MG/ML IJ SOLN
INTRAMUSCULAR | Status: AC
  Filled 2016-09-06: qty 0.5

## 2016-09-06 MED ORDER — ALPRAZOLAM 0.5 MG PO TABS
0.5000 mg | ORAL_TABLET | Freq: Three times a day (TID) | ORAL | Status: DC | PRN
  Administered 2016-09-07: 0.5 mg via ORAL
  Filled 2016-09-06: qty 1

## 2016-09-06 MED ORDER — OXYCODONE HCL 5 MG PO TABS
ORAL_TABLET | ORAL | Status: AC
  Administered 2016-09-07: 5 mg via ORAL
  Filled 2016-09-06: qty 2

## 2016-09-06 MED ORDER — POLYETHYLENE GLYCOL 3350 17 G PO PACK
17.0000 g | PACK | Freq: Two times a day (BID) | ORAL | Status: DC
  Administered 2016-09-06 – 2016-09-07 (×2): 17 g via ORAL
  Filled 2016-09-06 (×2): qty 1

## 2016-09-06 MED ORDER — SUGAMMADEX SODIUM 200 MG/2ML IV SOLN
INTRAVENOUS | Status: AC
  Filled 2016-09-06: qty 2

## 2016-09-06 MED ORDER — DEXAMETHASONE SODIUM PHOSPHATE 10 MG/ML IJ SOLN
10.0000 mg | Freq: Three times a day (TID) | INTRAMUSCULAR | Status: DC
  Administered 2016-09-06 – 2016-09-07 (×3): 10 mg via INTRAVENOUS
  Filled 2016-09-06 (×3): qty 1

## 2016-09-06 MED ORDER — DEXAMETHASONE SODIUM PHOSPHATE 10 MG/ML IJ SOLN
INTRAMUSCULAR | Status: AC
  Filled 2016-09-06: qty 1

## 2016-09-06 MED ORDER — VITAMIN C 500 MG PO TABS
1000.0000 mg | ORAL_TABLET | Freq: Every day | ORAL | Status: DC
  Administered 2016-09-07: 1000 mg via ORAL
  Filled 2016-09-06: qty 2

## 2016-09-06 MED ORDER — OMEGA-3-ACID ETHYL ESTERS 1 G PO CAPS
1.0000 g | ORAL_CAPSULE | Freq: Every day | ORAL | Status: DC
  Administered 2016-09-07: 1 g via ORAL
  Filled 2016-09-06 (×2): qty 1

## 2016-09-06 MED ORDER — METOCLOPRAMIDE HCL 5 MG PO TABS: 5.0000 mg | ORAL_TABLET | Freq: Three times a day (TID) | ORAL | Status: DC | PRN

## 2016-09-06 MED ORDER — LEVOTHYROXINE SODIUM 100 MCG PO TABS
100.0000 ug | ORAL_TABLET | Freq: Every day | ORAL | Status: DC
  Administered 2016-09-07: 100 ug via ORAL
  Filled 2016-09-06: qty 1

## 2016-09-06 MED ORDER — CEFAZOLIN SODIUM-DEXTROSE 2-4 GM/100ML-% IV SOLN
INTRAVENOUS | Status: AC
  Filled 2016-09-06: qty 100

## 2016-09-06 MED ORDER — ROCURONIUM BROMIDE 50 MG/5ML IV SOSY
PREFILLED_SYRINGE | INTRAVENOUS | Status: AC
  Filled 2016-09-06: qty 5

## 2016-09-06 MED ORDER — MIDAZOLAM HCL 5 MG/5ML IJ SOLN
INTRAMUSCULAR | Status: DC | PRN
  Administered 2016-09-06: 2 mg via INTRAVENOUS

## 2016-09-06 MED ORDER — MENTHOL 3 MG MT LOZG: 1.0000 | LOZENGE | OROMUCOSAL | Status: DC | PRN

## 2016-09-06 MED ORDER — LACTATED RINGERS IV SOLN
INTRAVENOUS | Status: DC | PRN
  Administered 2016-09-06 (×2): via INTRAVENOUS

## 2016-09-06 MED ORDER — SODIUM CHLORIDE 0.9 % IR SOLN
Status: DC | PRN
  Administered 2016-09-06: 3000 mL

## 2016-09-06 MED ORDER — POTASSIUM CHLORIDE IN NACL 20-0.9 MEQ/L-% IV SOLN
INTRAVENOUS | Status: DC
  Administered 2016-09-06: 15:00:00 via INTRAVENOUS
  Filled 2016-09-06: qty 1000

## 2016-09-06 MED ORDER — TRANEXAMIC ACID 1000 MG/10ML IV SOLN
2000.0000 mg | INTRAVENOUS | Status: DC
  Filled 2016-09-06: qty 20

## 2016-09-06 MED ORDER — PROPOFOL 10 MG/ML IV BOLUS
INTRAVENOUS | Status: AC
  Filled 2016-09-06: qty 20

## 2016-09-06 MED ORDER — ALUM & MAG HYDROXIDE-SIMETH 200-200-20 MG/5ML PO SUSP: 30.0000 mL | ORAL | Status: DC | PRN

## 2016-09-06 MED ORDER — BUPIVACAINE-EPINEPHRINE (PF) 0.5% -1:200000 IJ SOLN
INTRAMUSCULAR | Status: AC
  Filled 2016-09-06: qty 30

## 2016-09-06 MED ORDER — ASPIRIN EC 325 MG PO TBEC
325.0000 mg | DELAYED_RELEASE_TABLET | Freq: Every day | ORAL | Status: DC
  Administered 2016-09-07: 325 mg via ORAL
  Filled 2016-09-06 (×2): qty 1

## 2016-09-06 MED ORDER — CEFAZOLIN SODIUM-DEXTROSE 2-3 GM-% IV SOLR
INTRAVENOUS | Status: DC | PRN
  Administered 2016-09-06: 2 g via INTRAVENOUS

## 2016-09-06 MED ORDER — MIDAZOLAM HCL 2 MG/2ML IJ SOLN
INTRAMUSCULAR | Status: AC
  Filled 2016-09-06: qty 2

## 2016-09-06 MED ORDER — ACETAMINOPHEN 325 MG PO TABS: 650.0000 mg | ORAL_TABLET | Freq: Four times a day (QID) | ORAL | Status: DC | PRN

## 2016-09-06 MED ORDER — LACTATED RINGERS IV SOLN: INTRAVENOUS | Status: DC

## 2016-09-06 MED ORDER — VITAMIN D 1000 UNITS PO TABS
2000.0000 [IU] | ORAL_TABLET | Freq: Every day | ORAL | Status: DC
  Administered 2016-09-07: 2000 [IU] via ORAL
  Filled 2016-09-06: qty 2

## 2016-09-06 MED ORDER — HYDROMORPHONE HCL 1 MG/ML IJ SOLN
0.2500 mg | INTRAMUSCULAR | Status: DC | PRN
  Administered 2016-09-06 (×4): 0.5 mg via INTRAVENOUS

## 2016-09-06 MED ORDER — OMEGA-3 FATTY ACIDS 1000 MG PO CAPS: 2.0000 g | ORAL_CAPSULE | Freq: Every day | ORAL | Status: DC

## 2016-09-06 MED ORDER — METOCLOPRAMIDE HCL 5 MG/ML IJ SOLN
5.0000 mg | Freq: Three times a day (TID) | INTRAMUSCULAR | Status: DC | PRN
  Administered 2016-09-06: 10 mg via INTRAVENOUS
  Filled 2016-09-06: qty 2

## 2016-09-06 MED ORDER — FENTANYL CITRATE (PF) 100 MCG/2ML IJ SOLN
INTRAMUSCULAR | Status: DC | PRN
  Administered 2016-09-06 (×6): 50 ug via INTRAVENOUS

## 2016-09-06 MED ORDER — POVIDONE-IODINE 7.5 % EX SOLN: Freq: Once | CUTANEOUS | Status: DC

## 2016-09-06 MED ORDER — TRANEXAMIC ACID 1000 MG/10ML IV SOLN
1000.0000 mg | INTRAVENOUS | Status: AC
  Administered 2016-09-06: 1000 mg via INTRAVENOUS
  Filled 2016-09-06: qty 10

## 2016-09-06 MED ORDER — LIDOCAINE 2% (20 MG/ML) 5 ML SYRINGE
INTRAMUSCULAR | Status: AC
  Filled 2016-09-06: qty 5

## 2016-09-06 MED ORDER — ONDANSETRON HCL 4 MG PO TABS: 4.0000 mg | ORAL_TABLET | Freq: Four times a day (QID) | ORAL | Status: DC | PRN

## 2016-09-06 MED ORDER — HYPROMELLOSE (GONIOSCOPIC) 2.5 % OP SOLN: 2.0000 [drp] | Freq: Two times a day (BID) | OPHTHALMIC | Status: DC | PRN

## 2016-09-06 MED ORDER — ONDANSETRON HCL 4 MG/2ML IJ SOLN
INTRAMUSCULAR | Status: DC | PRN
  Administered 2016-09-06: 4 mg via INTRAVENOUS

## 2016-09-06 MED ORDER — SUGAMMADEX SODIUM 200 MG/2ML IV SOLN
INTRAVENOUS | Status: DC | PRN
  Administered 2016-09-06: 140 mg via INTRAVENOUS

## 2016-09-06 MED ORDER — DOCUSATE SODIUM 100 MG PO CAPS
100.0000 mg | ORAL_CAPSULE | Freq: Two times a day (BID) | ORAL | Status: DC
  Administered 2016-09-06 – 2016-09-07 (×2): 100 mg via ORAL
  Filled 2016-09-06 (×2): qty 1

## 2016-09-06 MED ORDER — CHLORHEXIDINE GLUCONATE 4 % EX LIQD
60.0000 mL | Freq: Once | CUTANEOUS | Status: DC
Start: 2016-09-06 — End: 2016-09-06

## 2016-09-06 MED ORDER — PROMETHAZINE HCL 25 MG/ML IJ SOLN: 6.2500 mg | INTRAMUSCULAR | Status: DC | PRN

## 2016-09-06 MED ORDER — PROPOFOL 10 MG/ML IV BOLUS
INTRAVENOUS | Status: DC | PRN
  Administered 2016-09-06: 150 mg via INTRAVENOUS

## 2016-09-06 MED ORDER — DIPHENHYDRAMINE HCL 12.5 MG/5ML PO ELIX: 12.5000 mg | ORAL_SOLUTION | ORAL | Status: DC | PRN

## 2016-09-06 MED ORDER — ACETAMINOPHEN 650 MG RE SUPP: 650.0000 mg | Freq: Four times a day (QID) | RECTAL | Status: DC | PRN

## 2016-09-06 MED ORDER — BUPIVACAINE HCL (PF) 0.25 % IJ SOLN
INTRAMUSCULAR | Status: AC
  Filled 2016-09-06: qty 30

## 2016-09-06 MED ORDER — ONDANSETRON HCL 4 MG/2ML IJ SOLN
INTRAMUSCULAR | Status: AC
  Filled 2016-09-06: qty 2

## 2016-09-06 MED ORDER — CHLORHEXIDINE GLUCONATE 4 % EX LIQD: 60.0000 mL | Freq: Once | CUTANEOUS | Status: DC

## 2016-09-06 SURGICAL SUPPLY — 72 items
APL SKNCLS STERI-STRIP NONHPOA (GAUZE/BANDAGES/DRESSINGS) ×1
BANDAGE ACE 6X5 VEL STRL LF (GAUZE/BANDAGES/DRESSINGS) ×1 IMPLANT
BANDAGE ESMARK 6X9 LF (GAUZE/BANDAGES/DRESSINGS) ×1 IMPLANT
BENZOIN TINCTURE PRP APPL 2/3 (GAUZE/BANDAGES/DRESSINGS) ×2 IMPLANT
BLADE SAGITTAL 25.0X1.19X90 (BLADE) ×2 IMPLANT
BLADE SAW SGTL 13X75X1.27 (BLADE) ×2 IMPLANT
BLADE SURG 10 STRL SS (BLADE) ×4 IMPLANT
BNDG CMPR 9X6 STRL LF SNTH (GAUZE/BANDAGES/DRESSINGS) ×1
BNDG CMPR MED 15X6 ELC VLCR LF (GAUZE/BANDAGES/DRESSINGS) ×1
BNDG ELASTIC 6X15 VLCR STRL LF (GAUZE/BANDAGES/DRESSINGS) ×2 IMPLANT
BNDG ESMARK 6X9 LF (GAUZE/BANDAGES/DRESSINGS) ×2
BOWL SMART MIX CTS (DISPOSABLE) ×2 IMPLANT
CAPT KNEE TOTAL 3 ATTUNE ×1 IMPLANT
CEMENT HV SMART SET (Cement) ×4 IMPLANT
COVER SURGICAL LIGHT HANDLE (MISCELLANEOUS) ×2 IMPLANT
CUFF TOURNIQUET SINGLE 34IN LL (TOURNIQUET CUFF) ×2 IMPLANT
CUFF TOURNIQUET SINGLE 44IN (TOURNIQUET CUFF) IMPLANT
DECANTER SPIKE VIAL GLASS SM (MISCELLANEOUS) ×1 IMPLANT
DRAPE EXTREMITY T 121X128X90 (DRAPE) ×2 IMPLANT
DRAPE HALF SHEET 40X57 (DRAPES) ×2 IMPLANT
DRAPE INCISE IOBAN 66X45 STRL (DRAPES) ×1 IMPLANT
DRAPE PROXIMA HALF (DRAPES) ×2 IMPLANT
DRAPE U-SHAPE 47X51 STRL (DRAPES) ×2 IMPLANT
DRSG AQUACEL AG ADV 3.5X14 (GAUZE/BANDAGES/DRESSINGS) ×2 IMPLANT
DURAPREP 26ML APPLICATOR (WOUND CARE) ×3 IMPLANT
ELECT CAUTERY BLADE 6.4 (BLADE) ×2 IMPLANT
ELECT REM PT RETURN 9FT ADLT (ELECTROSURGICAL) ×2
ELECTRODE REM PT RTRN 9FT ADLT (ELECTROSURGICAL) ×1 IMPLANT
FACESHIELD WRAPAROUND (MASK) ×2 IMPLANT
FACESHIELD WRAPAROUND OR TEAM (MASK) ×1 IMPLANT
GLOVE BIO SURGEON STRL SZ7 (GLOVE) ×2 IMPLANT
GLOVE BIOGEL PI IND STRL 7.0 (GLOVE) ×1 IMPLANT
GLOVE BIOGEL PI IND STRL 7.5 (GLOVE) ×1 IMPLANT
GLOVE BIOGEL PI INDICATOR 7.0 (GLOVE) ×2
GLOVE BIOGEL PI INDICATOR 7.5 (GLOVE) ×1
GLOVE SS BIOGEL STRL SZ 7.5 (GLOVE) ×1 IMPLANT
GLOVE SUPERSENSE BIOGEL SZ 7.5 (GLOVE) ×1
GOWN STRL REUS W/ TWL LRG LVL3 (GOWN DISPOSABLE) ×1 IMPLANT
GOWN STRL REUS W/ TWL XL LVL3 (GOWN DISPOSABLE) ×2 IMPLANT
GOWN STRL REUS W/TWL LRG LVL3 (GOWN DISPOSABLE) ×2
GOWN STRL REUS W/TWL XL LVL3 (GOWN DISPOSABLE) ×6
HANDPIECE INTERPULSE COAX TIP (DISPOSABLE) ×2
HOOD PEEL AWAY FACE SHEILD DIS (HOOD) ×4 IMPLANT
IMMOBILIZER KNEE 22 UNIV (SOFTGOODS) ×2 IMPLANT
KIT BASIN OR (CUSTOM PROCEDURE TRAY) ×2 IMPLANT
KIT ROOM TURNOVER OR (KITS) ×2 IMPLANT
MANIFOLD NEPTUNE II (INSTRUMENTS) ×2 IMPLANT
MARKER SKIN DUAL TIP RULER LAB (MISCELLANEOUS) ×2 IMPLANT
NDL 18GX1X1/2 (RX/OR ONLY) (NEEDLE) ×1 IMPLANT
NEEDLE 18GX1X1/2 (RX/OR ONLY) (NEEDLE) ×2 IMPLANT
NS IRRIG 1000ML POUR BTL (IV SOLUTION) ×2 IMPLANT
PACK TOTAL JOINT (CUSTOM PROCEDURE TRAY) ×2 IMPLANT
PAD ARMBOARD 7.5X6 YLW CONV (MISCELLANEOUS) ×4 IMPLANT
SET HNDPC FAN SPRY TIP SCT (DISPOSABLE) ×1 IMPLANT
STRIP CLOSURE SKIN 1/2X4 (GAUZE/BANDAGES/DRESSINGS) ×2 IMPLANT
SUCTION FRAZIER HANDLE 10FR (MISCELLANEOUS) ×1
SUCTION TUBE FRAZIER 10FR DISP (MISCELLANEOUS) ×1 IMPLANT
SUT MNCRL AB 3-0 PS2 18 (SUTURE) ×2 IMPLANT
SUT VIC AB 0 CT1 27 (SUTURE) ×8
SUT VIC AB 0 CT1 27XBRD ANBCTR (SUTURE) ×2 IMPLANT
SUT VIC AB 1 CT1 27 (SUTURE) ×2
SUT VIC AB 1 CT1 27XBRD ANBCTR (SUTURE) ×1 IMPLANT
SUT VIC AB 2-0 CT1 27 (SUTURE) ×4
SUT VIC AB 2-0 CT1 TAPERPNT 27 (SUTURE) ×2 IMPLANT
SYR 30ML LL (SYRINGE) ×2 IMPLANT
TOWEL OR 17X24 6PK STRL BLUE (TOWEL DISPOSABLE) ×2 IMPLANT
TOWEL OR 17X26 10 PK STRL BLUE (TOWEL DISPOSABLE) ×2 IMPLANT
TRAY CATH 16FR W/PLASTIC CATH (SET/KITS/TRAYS/PACK) IMPLANT
TRAY FOLEY CATH 16FR SILVER (SET/KITS/TRAYS/PACK) ×1 IMPLANT
TRAY FOLEY CATH SILVER 14FR (SET/KITS/TRAYS/PACK) ×1 IMPLANT
TUBE CONNECTING 12X1/4 (SUCTIONS) ×2 IMPLANT
YANKAUER SUCT BULB TIP NO VENT (SUCTIONS) ×2 IMPLANT

## 2016-09-06 NOTE — Progress Notes (Signed)
report given to [place] rn as caregiver

## 2016-09-06 NOTE — Anesthesia Postprocedure Evaluation (Addendum)
Anesthesia Post Note  Patient: Yvette Joyce  Procedure(s) Performed: Procedure(s) (LRB): TOTAL KNEE ARTHROPLASTY (Left)  Patient location during evaluation: PACU Anesthesia Type: General and Regional Level of consciousness: awake and alert Pain management: satisfactory to patient Vital Signs Assessment: post-procedure vital signs reviewed and stable Respiratory status: spontaneous breathing, nonlabored ventilation, respiratory function stable and patient connected to nasal cannula oxygen Cardiovascular status: blood pressure returned to baseline and stable Postop Assessment: no signs of nausea or vomiting Anesthetic complications: no       Last Vitals:  Vitals:   09/06/16 1058 09/06/16 1100  BP: 137/67   Pulse: 73   Resp: 13   Temp:  36.4 C    Last Pain:  Vitals:   09/06/16 1030  TempSrc:   PainSc: Asleep                 [name],JAMES TERRILL

## 2016-09-06 NOTE — Evaluation (Signed)
Physical Therapy Evaluation Patient Details Name: Yvette Joyce MRN: [id number] DOB: [birth year] Today's Date: 09/06/2016   History of Present Illness  Admitted for L TKA, WBAT;  has a past medical history of Adult attention deficit disorder; Arthritis; Complication of anesthesia; and Trigeminal neuralgia pain.   Clinical Impression   Pt is s/p TKA resulting in the deficits listed below (see PT Problem List).  Pt will benefit from skilled PT to increase their independence and safety with mobility to allow discharge to the venue listed below.      Follow Up Recommendations Home health PT;Supervision/Assistance - 24 hour    Equipment Recommendations  Rolling walker with 5" wheels;3in1 (PT)    Recommendations for Other Services OT consult     Precautions / Restrictions Precautions Precautions: Knee Precaution Comments: Pt educated to not allow any pillow or bolster under knee for healing with optimal range of motion.  Restrictions Weight Bearing Restrictions: Yes LLE Weight Bearing: Weight bearing as tolerated      Mobility  Bed Mobility Overal bed mobility: Needs Assistance Bed Mobility: Supine to Sit     Supine to sit: Min assist     General bed mobility comments: Min assist to pull to sit  Transfers Overall transfer level: Needs assistance Equipment used: Rolling walker (2 wheeled) Transfers: Sit to/from Stand Sit to Stand: Min assist         General transfer comment: Min assist to steady with transition of hands from bed to RW  Ambulation/Gait Ambulation/Gait assistance: Min guard Ambulation Distance (Feet): 10 Feet Assistive device: Rolling walker (2 wheeled) Gait Pattern/deviations: Step-through pattern     General Gait Details: Cues for gait sequence and to activate quad for stance stability  Stairs            Wheelchair Mobility    Modified Rankin (Stroke Patients Only)       Balance                                              Pertinent Vitals/Pain Pain Assessment: 0-10 Pain Score: 5  Pain Location: L knee Pain Descriptors / Indicators: Aching Pain Intervention(s): Monitored during session    Home Living Family/patient expects to be discharged to:: Private residence Living Arrangements: Alone Available Help at Discharge: Other (Comment) (Boyfriend) Type of Home: House Home Access: Stairs to enter Entrance Stairs-Rails: Right;Left;Can reach both Entrance Stairs-Number of Steps: 5 Home Layout: Multi-level;1/2 bath on main level (moved a bed downstairs in prep for surgery)        Prior Function Level of Independence: Independent         Comments: likes golf     Hand Dominance        Extremity/Trunk Assessment   Upper Extremity Assessment Upper Extremity Assessment: Overall WFL for tasks assessed    Lower Extremity Assessment Lower Extremity Assessment: LLE deficits/detail LLE Deficits / Details: Very nice ROM, 0-90 approximately; grossly decr strength, with quad lag with straight leg raises       Communication   Communication: No difficulties  Cognition Arousal/Alertness: Awake/alert Behavior During Therapy: WFL for tasks assessed/performed Overall Cognitive Status: Within Functional Limits for tasks assessed                      General Comments General comments (skin integrity, edema, etc.): Limited by nausea    Exercises  Assessment/Plan    PT Assessment Patient needs continued PT services  PT Problem List Decreased strength;Decreased activity tolerance;Decreased range of motion;Decreased balance;Decreased mobility;Decreased knowledge of use of DME;Decreased knowledge of precautions;Pain          PT Treatment Interventions DME instruction;Gait training;Stair training;Functional mobility training;Therapeutic activities;Therapeutic exercise;Patient/family education    PT Goals (Current goals can be found in the Care Plan section)  Acute Rehab PT  Goals Patient Stated Goal: be able to get up and walk or run wherever she wants to PT Goal Formulation: With patient Time For Goal Achievement: 09/13/16 Potential to Achieve Goals: Good    Frequency 7X/week   Barriers to discharge        Co-evaluation               End of Session Equipment Utilized During Treatment: Gait belt Activity Tolerance: Patient tolerated treatment well Patient left: in chair;with call bell/phone within reach Nurse Communication: Mobility status (Nausea)         Time: [id number] PT Time Calculation (min) (ACUTE ONLY): 35 min   Charges:   PT Evaluation $PT Eval Low Complexity: 1 Procedure PT Treatments $Gait Training: 8-22 mins   PT G Codes:        Colletta Maryland 09/06/2016, 4:41 PM  Roney Marion, [facility] Pager [phone] Office [phone]

## 2016-09-06 NOTE — Transfer of Care (Signed)
Immediate Anesthesia Transfer of Care Note  Patient: Yvette Joyce  Procedure(s) Performed: Procedure(s): TOTAL KNEE ARTHROPLASTY (Left)  Patient Location: PACU  Anesthesia Type:General and Regional  Level of Consciousness: awake, alert  and oriented  Airway & Oxygen Therapy: Patient Spontanous Breathing  Post-op Assessment: Report given to RN, Post -op Vital signs reviewed and stable and Patient moving all extremities X 4  Post vital signs: Reviewed and stable  Last Vitals:  Vitals:   09/06/16 0556  BP: (!) 159/76  Pulse: 75  Resp: 18  Temp: 36.8 C    Last Pain:  Vitals:   09/06/16 0643  TempSrc:   PainSc: 4          Complications: No apparent anesthesia complications

## 2016-09-06 NOTE — Anesthesia Procedure Notes (Addendum)
Anesthesia Regional Block:  Adductor canal block  Pre-Anesthetic Checklist: ,, timeout performed, Correct Patient, Correct Site, Correct Laterality, Correct Procedure, Correct Position, site marked, Risks and benefits discussed,  Surgical consent,  Pre-op evaluation,  At surgeon's request and post-op pain management  Laterality: Left and Lower  Prep: chloraprep       Needles:   Needle Type: Echogenic Needle     Needle Length: 9cm 9 cm Needle Gauge: 21 and 21 G  Needle insertion depth: 5 cm   Additional Needles:  Procedures: ultrasound guided (picture in chart) Adductor canal block Narrative:  Start time: 09/06/2016 6:55 AM End time: 09/06/2016 7:10 AM Injection made incrementally with aspirations every 5 mL.  Performed by: Personally  Anesthesiologist: Nyara Capell

## 2016-09-06 NOTE — Progress Notes (Signed)
Orthopedic Tech Progress Note Patient Details:  Yvette Joyce 1960/09/07 [id number]  CPM Left Knee CPM Left Knee: On Left Knee Flexion (Degrees): 90 Left Knee Extension (Degrees): 0 Additional Comments: Placed CPM on 0-90 (Pt Left Leg/knee) Pt tolerated well.   Kristopher Oppenheim 09/06/2016, 9:37 PM

## 2016-09-06 NOTE — Anesthesia Procedure Notes (Signed)
Procedure Name: Intubation Date/Time: 09/06/2016 7:27 AM Performed by: [name] T Pre-anesthesia Checklist: Patient identified, Emergency Drugs available, Suction available and Patient being monitored Patient Re-evaluated:Patient Re-evaluated prior to inductionOxygen Delivery Method: Circle System Utilized Preoxygenation: Pre-oxygenation with 100% oxygen Intubation Type: IV induction Ventilation: Mask ventilation without difficulty Laryngoscope Size: Miller and 2 Grade View: Grade II Tube type: Oral Tube size: 7.0 mm Number of attempts: 1 Airway Equipment and Method: Stylet and Oral airway Placement Confirmation: ETT inserted through vocal cords under direct vision,  positive ETCO2 and breath sounds checked- equal and bilateral Secured at: 21 cm Tube secured with: Tape Dental Injury: Teeth and Oropharynx as per pre-operative assessment

## 2016-09-06 NOTE — Anesthesia Preprocedure Evaluation (Addendum)
Anesthesia Evaluation  Patient identified by MRN, date of birth, ID band Patient awake    Reviewed: Allergy & Precautions, NPO status , Patient's Chart, lab work & pertinent test results  History of Anesthesia Complications (+) PROLONGED EMERGENCE and history of anesthetic complications  Airway Mallampati: II  TM Distance: >3 FB Neck ROM: Full    Dental  (+) Dental Advisory Given, Teeth Intact   Pulmonary    breath sounds clear to auscultation       Cardiovascular negative cardio ROS   Rhythm:Regular Rate:Normal     Neuro/Psych  Headaches,    GI/Hepatic negative GI ROS, Neg liver ROS,   Endo/Other  negative endocrine ROSHypothyroidism   Renal/GU negative Renal ROS     Musculoskeletal  (+) Arthritis ,   Abdominal   Peds  Hematology   Anesthesia Other Findings   Reproductive/Obstetrics                           Anesthesia Physical Anesthesia Plan  ASA: I  Anesthesia Plan: General   Post-op Pain Management: GA combined w/ Regional for post-op pain   Induction: Intravenous  Airway Management Planned: Oral ETT  Additional Equipment:   Intra-op Plan:   Post-operative Plan: Extubation in OR  Informed Consent: I have reviewed the patients History and Physical, chart, labs and discussed the procedure including the risks, benefits and alternatives for the proposed anesthesia with the patient or authorized representative who has indicated his/her understanding and acceptance.   Dental advisory given  Plan Discussed with: CRNA, Anesthesiologist and Surgeon  Anesthesia Plan Comments:        Anesthesia Quick Evaluation

## 2016-09-06 NOTE — Op Note (Signed)
MRN:     [id number] DOB/AGE:    56-Mar-1962 / 56 y.o.       OPERATIVE REPORT    DATE OF PROCEDURE:  09/06/2016       PREOPERATIVE DIAGNOSIS:   Primary localized OA left knee      Estimated body mass index is 25.5 kg/m as calculated from the following:   Height as of 08/31/16: 5\' 5"  (1.651 m).   Weight as of 08/31/16: 153 lb 4 oz (69.5 kg).                                                        POSTOPERATIVE DIAGNOSIS:   same                                                                  PROCEDURE:  Procedure(s): TOTAL KNEE ARTHROPLASTY Using Depuy Attune RP implants #6 narrow Femur, #5Tibia, 30mm  RP bearing, 32 Patella     SURGEON: Haniyyah Sakuma Joyce    ASSISTANT:  Kirstin Shepperson PA-C   (Present and scrubbed throughout the case, critical for assistance with exposure, retraction, instrumentation, and closure.)         ANESTHESIA: GET with Adductor Nerve Block     TOURNIQUET TIME: [id number]   COMPLICATIONS:  None     SPECIMENS: None   INDICATIONS FOR PROCEDURE: The patient has  Degenerative joint disease left knee, varus deformities, XR shows bone on bone arthritis. Patient has failed all conservative measures including anti-inflammatory medicines, narcotics, attempts at  exercise and weight loss, cortisone injections and viscosupplementation.  Risks and benefits of surgery have been discussed, questions answered.   DESCRIPTION OF PROCEDURE: The patient identified by armband, received  right femoral nerve block and IV antibiotics, in the holding area at [facility]. Patient taken to the operating room, appropriate anesthetic  monitors were attached General endotracheal anesthesia induced with  the patient in supine position, Foley catheter was inserted. Tourniquet  applied high to the operative thigh. Lateral post and foot positioner  applied to the table, the lower extremity was then prepped and draped  in usual sterile fashion from the ankle to the tourniquet. Time-out  procedure was performed. The limb was wrapped with an Esmarch bandage and the tourniquet inflated to 365 mmHg. We began the operation by making the anterior midline incision starting at handbreadth above the patella going over the patella 1 cm medial to and  4 cm distal to the tibial tubercle. Small bleeders in the skin and the  subcutaneous tissue identified and cauterized. Transverse retinaculum was incised and reflected medially and Joyce medial parapatellar arthrotomy was accomplished. the patella was everted and theprepatellar fat pad resected. The superficial medial collateral  ligament was then elevated from anterior to posterior along the proximal  flare of the tibia and anterior half of the menisci resected. The knee was hyperflexed exposing bone on bone arthritis. Peripheral and notch osteophytes as well as the cruciate ligaments were then resected. We continued to  work our way around posteriorly along the proximal tibia, and externally  rotated the tibia subluxing it out  from underneath the femur. Joyce McHale  retractor was placed through the notch and Joyce lateral Hohmann retractor  placed, and we then drilled through the proximal tibia in line with the  axis of the tibia followed by an intramedullary guide rod and 2-degree  posterior slope cutting guide. The tibial cutting guide was pinned into place  allowing resection of 4 mm of bone medially and about 6 mm of bone  laterally because of her varus deformity. Satisfied with the tibial resection, we then  entered the distal femur 2 mm anterior to the PCL origin with the  intramedullary guide rod and applied the distal femoral cutting guide  set at 56mm, with 5 degrees of valgus. This was pinned along the  epicondylar axis. At this point, the distal femoral cut was accomplished without difficulty. We then sized for Joyce #6 narrow femoral component and pinned the guide in 3 degrees of external rotation.The chamfer cutting guide was pinned into place.  The anterior, posterior, and chamfer cuts were accomplished without difficulty followed by  the  RP box cutting guide and the box cut. We also removed posterior osteophytes from the posterior femoral condyles. At this  time, the knee was brought into full extension. We checked our  extension and flexion gaps and found them symmetric at 12mm.  The patella thickness measured at 25 mm. We set the cutting guide at 15 and removed the posterior 9.5-10 mm  of the patella sized for 32 button and drilled the lollipop. The knee  was then once again hyperflexed exposing the proximal tibia. We sized for Joyce #5 tibial base plate, applied the smokestack and the conical reamer followed by the the Delta fin keel punch. We then hammered into place the  RP trial femoral component, inserted Joyce 1 trial bearing, trial patellar button, and took the knee through range of motion from 0-130 degrees. No thumb pressure was required for patellar  tracking. At this point, all trial components were removed, Joyce double batch of DePuy HV cement  was mixed and applied to all bony metallic mating surfaces except for the posterior condyles of the femur itself. In order, we  hammered into place the tibial tray and removed excess cement, the femoral component and removed excess cement, Joyce 4mm  RP bearing  was inserted, and the knee brought to full extension with compression.  The patellar button was clamped into place, and excess cement  removed. While the cement cured the wound was irrigated out with normal saline solution pulse lavage.. Ligament stability and patellar tracking were checked and found to be excellent.. The parapatellar arthrotomy was closed with  #1 Vicryl suture. The subcutaneous tissue with 0 and 2-0 undyed  Vicryl suture, and 4-0 Monocryl.. Joyce dressing of Aquaseal,  4 x 4, dressing sponges, Webril, and Ace wrap applied. Needle and sponge count were correct times 2.The patient awakened, extubated, and taken to recovery room  without difficulty. Vascular status was normal, pulses 2+ and symmetric.   Yvette Joyce 09/06/2016, 8:53 AM

## 2016-09-06 NOTE — Progress Notes (Signed)
Orthopedic Tech Progress Note Patient Details:  Yvette Joyce [birth year] [id number]  CPM Left Knee CPM Left Knee: On Left Knee Flexion (Degrees): 90 Left Knee Extension (Degrees): 0 Additional Comments: Trapeze bar and foot roll   [name] 09/06/2016, 10:21 AM

## 2016-09-07 ENCOUNTER — Encounter (HOSPITAL_COMMUNITY): Payer: Self-pay | Admitting: Orthopedic Surgery

## 2016-09-07 LAB — BASIC METABOLIC PANEL
Anion gap: 6 (ref 5–15)
BUN: 7 mg/dL (ref 6–20)
CHLORIDE: 103 mmol/L (ref 101–111)
CO2: 27 mmol/L (ref 22–32)
Calcium: 9 mg/dL (ref 8.9–10.3)
Creatinine, Ser: 0.67 mg/dL (ref 0.44–1.00)
GFR calc Af Amer: >60 mL/min (ref >60)
GFR calc non Af Amer: >60 mL/min (ref >60)
Glucose, Bld: 154 mg/dL — ABNORMAL HIGH (ref 65–99)
POTASSIUM: 4.5 mmol/L (ref 3.5–5.1)
SODIUM: 136 mmol/L (ref 135–145)

## 2016-09-07 LAB — CBC
HEMATOCRIT: 39.2 % (ref 36.0–46.0)
HEMOGLOBIN: 12.8 g/dL (ref 12.0–15.0)
MCH: 30.8 pg (ref 26.0–34.0)
MCHC: 32.7 g/dL (ref 30.0–36.0)
MCV: 94.5 fL (ref 78.0–100.0)
Platelets: 168 10*3/uL (ref 150–400)
RBC: 4.15 MIL/uL (ref 3.87–5.11)
RDW: 12.8 % (ref 11.5–15.5)
WBC: 8.5 10*3/uL (ref 4.0–10.5)

## 2016-09-07 MED ORDER — DOCUSATE SODIUM 100 MG PO CAPS: ORAL_CAPSULE | ORAL | 0 refills | Status: DC

## 2016-09-07 MED ORDER — OXYCODONE HCL 5 MG PO TABS: ORAL_TABLET | ORAL | 0 refills | Status: DC

## 2016-09-07 MED ORDER — ACETAMINOPHEN 325 MG PO TABS: 650.0000 mg | ORAL_TABLET | Freq: Four times a day (QID) | ORAL | Status: DC | PRN

## 2016-09-07 MED ORDER — ASPIRIN 325 MG PO TBEC: DELAYED_RELEASE_TABLET | ORAL | 0 refills | Status: DC

## 2016-09-07 MED ORDER — ONDANSETRON HCL 4 MG PO TABS: 4.0000 mg | ORAL_TABLET | Freq: Four times a day (QID) | ORAL | 0 refills | Status: DC | PRN

## 2016-09-07 MED ORDER — ALPRAZOLAM 0.25 MG PO TABS: ORAL_TABLET | ORAL | 0 refills | Status: DC

## 2016-09-07 MED ORDER — POLYETHYLENE GLYCOL 3350 17 GM/SCOOP PO POWD: ORAL | 0 refills | Status: DC

## 2016-09-07 NOTE — Discharge Summary (Signed)
Patient ID: Yvette Joyce MRN: [id number] DOB/AGE: [birth year] 56 y.o.  Admit date: 09/06/2016 Discharge date: 09/07/2016  Admission Diagnoses:  Principal Problem:   Primary localized osteoarthritis of left knee Active Problems:   Hypothyroid   Sleep disturbance   Postmenopause   Trigeminal neuralgia pain   Discharge Diagnoses:  Same  Past Medical History:  Diagnosis Date  . Adult attention deficit disorder   . Arthritis   . Complication of anesthesia    "hard to wake up"  . Constipation   . Fatigue 03/07/2015  . Headache(784.0)   . Hematuria 04/12/2013  . Hypothyroidism   . Postmenopause 03/07/2015  . Primary localized osteoarthritis of left knee 08/26/2016  . Sleep disturbance 03/07/2015  . Snoring   . Thyroid disease   . Trigeminal neuralgia pain     Surgeries: Procedure(s): TOTAL KNEE ARTHROPLASTY on 09/06/2016   Consultants:   Discharged Condition: Improved  Hospital Course: Yvette Joyce is an 56 y.o. female who was admitted 09/06/2016 for operative treatment ofPrimary localized osteoarthritis of left knee. Patient has severe unremitting pain that affects sleep, daily activities, and work/hobbies. After pre-op clearance the patient was taken to the operating room on 09/06/2016 and underwent  Procedure(s): TOTAL KNEE ARTHROPLASTY.    Patient was given perioperative antibiotics: Anti-infectives    Start     Dose/Rate Route Frequency Ordered Stop   09/06/16 1430  ceFAZolin (ANCEF) IVPB 2g/100 mL premix     2 g 200 mL/hr over 30 Minutes Intravenous Every 6 hours 09/06/16 1343 09/06/16 2117   09/06/16 0707  ceFAZolin (ANCEF) 2-4 GM/100ML-% IVPB    Comments:  Yvette Joyce   : cabinet override      09/06/16 0707 09/06/16 1914   09/06/16 0648  ceFAZolin (ANCEF) 2-4 GM/100ML-% IVPB    Comments:  Yvette Joyce   : cabinet override      09/06/16 [id number] 09/06/16 1859       Patient was given sequential compression devices, early ambulation, and chemoprophylaxis to prevent  DVT.  Patient benefited maximally from hospital stay and there were no complications.    Recent vital signs: Patient Vitals for the past 24 hrs:  BP Temp Temp src Pulse Resp SpO2  09/07/16 0456 126/61 98.3 F (36.8 C) Oral 68 16 98 %  09/07/16 0115 116/62 98 F (36.7 C) Oral 64 16 97 %  09/06/16 2146 (!) 109/49 98.2 F (36.8 C) Oral 69 16 97 %  09/06/16 1500 140/68 98.2 F (36.8 C) Oral 71 16 100 %  09/06/16 1325 - - - 63 11 99 %  09/06/16 1315 - 97.6 F (36.4 C) - 65 (!) 9 99 %  09/06/16 1300 - - - 64 (!) 9 100 %  09/06/16 1258 134/70 - - 66 10 99 %  09/06/16 1245 - - - 69 10 99 %  09/06/16 1240 131/73 - - 74 15 100 %  09/06/16 1230 - - - 74 15 100 %  09/06/16 1215 - - - 71 12 99 %  09/06/16 1201 138/77 - - 73 13 100 %  09/06/16 1200 - - - 79 17 100 %  09/06/16 1145 - - - 68 13 100 %  09/06/16 1143 (!) 148/77 - - 68 13 100 %  09/06/16 1130 - - - 73 19 100 %  09/06/16 1128 (!) 141/75 - - 72 13 100 %  09/06/16 1115 - - - 78 20 100 %  09/06/16 1113 139/75 - - 69 11 100 %  09/06/16 1100 - 97.5 F (36.4 C) - 71 16 99 %  09/06/16 1058 137/67 - - 73 13 99 %  09/06/16 1045 - - - 80 15 100 %  09/06/16 1043 (!) 144/76 - - 75 11 98 %  09/06/16 1030 - - - 71 13 95 %  09/06/16 1028 (!) 149/77 - - 71 10 100 %  09/06/16 1015 - - - 82 20 97 %  09/06/16 1013 134/70 - - 78 11 100 %  09/06/16 1000 - - - 85 15 96 %  09/06/16 0958 (!) 149/69 - - 83 15 94 %  09/06/16 0945 - - - 87 16 95 %  09/06/16 0943 (!) 154/97 - - 88 17 94 %  09/06/16 0930 140/72 97.9 F (36.6 C) - 84 13 96 %  09/06/16 0927 140/72 - - 88 15 100 %     Recent laboratory studies:  Recent Labs  09/07/16 0351  WBC 8.5  HGB 12.8  HCT 39.2  PLT 168  NA 136  K 4.5  CL 103  CO2 27  BUN 7  CREATININE 0.67  GLUCOSE 154*  CALCIUM 9.0     Discharge Medications:   Allergies as of 09/07/2016   No Known Allergies     Medication List    STOP taking these medications   Armodafinil 250 MG tablet    carbamazepine 100 MG chewable tablet Commonly known as:  TEGRETOL     TAKE these medications   acetaminophen 325 MG tablet Commonly known as:  TYLENOL Take 2 tablets (650 mg total) by mouth every 6 (six) hours as needed for mild pain (or Fever >/= 101).   ALPRAZolam 0.25 MG tablet Commonly known as:  XANAX 1 tablet every 8 hrs as needed for anxiety or sleep What changed:  medication strength  additional instructions   aspirin 325 MG EC tablet 1 tab a day for the next 30 days to prevent blood clots   Biotin 10000 MCG Tabs Take 10,000 Units by mouth daily.   carboxymethylcellulose 0.5 % Soln Commonly known as:  REFRESH PLUS Place 2 drops into both eyes 2 (two) times daily as needed (eye dryness).   docusate sodium 100 MG capsule Commonly known as:  COLACE 1 tab 2 times a day while on narcotics.  STOOL SOFTENER   fish oil-omega-3 fatty acids 1000 MG capsule Take 2 g by mouth daily.   ibuprofen 800 MG tablet Commonly known as:  ADVIL,MOTRIN Take 1 tablet (800 mg total) by mouth every 8 (eight) hours as needed.   levothyroxine 100 MCG tablet Commonly known as:  SYNTHROID, LEVOTHROID TAKE 1 TABLET BY MOUTH DAILY BEFORE BREAKFAST.   ondansetron 4 MG tablet Commonly known as:  ZOFRAN Take 1 tablet (4 mg total) by mouth every 6 (six) hours as needed for nausea.   oxyCODONE 5 MG immediate release tablet Commonly known as:  Oxy IR/ROXICODONE 1-2 tablets every 4-6 hrs as needed for pain   polyethylene glycol powder powder Commonly known as:  GLYCOLAX/MIRALAX 17grams in 6 oz of water twice a day until bowel movement.  LAXITIVE.  Restart if two days since last bowel movement What changed:  how much to take  how to take this  when to take this  additional instructions   VITAMIN A PO Take 2,400 Units by mouth daily.   vitamin C 1000 MG tablet Take 1,000 mg by mouth daily.   Vitamin D3 2000 units Tabs Take 2,000 Units by mouth daily.  vitamin E 400 UNIT  capsule Take 400 Units by mouth daily.       Diagnostic Studies: Dg Chest 2 View  Result Date: 08/31/2016 CLINICAL DATA:  Preop for left knee replacement, recent upper respiratory infection EXAM: CHEST  2 VIEW COMPARISON:  Chest x-ray of 05/30/2013 FINDINGS: The lungs remain clear and somewhat hyperaerated. Mediastinal and hilar contours are unremarkable. The heart is within normal limits in size. No acute bony abnormality seen with mild thoracolumbar scoliosis again noted. IMPRESSION: 1. No active lung disease.  Slight hyper aeration. 2. No significant change in mild to moderate thoracolumbar scoliosis. Electronically Signed   By: Ivar Drape M.D.   On: 08/31/2016 11:21   Dg Knee Left Port  Result Date: 09/06/2016 CLINICAL DATA:  Total knee replacement. EXAM: PORTABLE LEFT KNEE - 1-2 VIEW COMPARISON:  MRI 10/25/2013. FINDINGS: Total left knee replacement.  Hardware intact.  Anatomic alignment. IMPRESSION: Total left knee replacement good anatomic alignment . Electronically Signed   By: Marcello Moores  Register   On: 09/06/2016 10:07    Disposition: 01-Home or Self Care  Discharge Instructions    CPM    Complete by:  As directed    Continuous passive motion machine (CPM):      Use the CPM from 0 to 90 for 6 hours per day.       You may break it up into 2 or 3 sessions per day.      Use CPM for 2 weeks or until you are told to stop.   Call MD / Call 911    Complete by:  As directed    If you experience chest pain or shortness of breath, CALL 911 and be transported to the hospital emergency room.  If you develope a fever above 101 F, pus (white drainage) or increased drainage or redness at the wound, or calf pain, call your surgeon's office.   Change dressing    Complete by:  As directed    Change the gauze dressing daily with sterile 4 x 4 inch gauze and apply TED hose.  DO NOT REMOVE BANDAGE OVER SURGICAL INCISION.  [place] WHOLE LEG INCLUDING OVER THE WATERPROOF BANDAGE WITH SOAP AND WATER EVERY  DAY.   Constipation Prevention    Complete by:  As directed    Drink plenty of fluids.  Prune juice may be helpful.  You may use a stool softener, such as Colace (over the counter) 100 mg twice a day.  Use MiraLax (over the counter) for constipation as needed.   Diet - low sodium heart healthy    Complete by:  As directed    Discharge instructions    Complete by:  As directed    INSTRUCTIONS AFTER JOINT REPLACEMENT   Remove items at home which could result in a fall. This includes throw rugs or furniture in walking pathways ICE to the affected joint every three hours while awake for 30 minutes at a time, for at least the first 3-5 days, and then as needed for pain and swelling.  Continue to use ice for pain and swelling. You may notice swelling that will progress down to the foot and ankle.  This is normal after surgery.  Elevate your leg when you are not up walking on it.   Continue to use the breathing machine you got in the hospital (incentive spirometer) which will help keep your temperature down.  It is common for your temperature to cycle up and down following surgery, especially at night  when you are not up moving around and exerting yourself.  The breathing machine keeps your lungs expanded and your temperature down.   DIET:  As you were doing prior to hospitalization, we recommend a well-balanced diet.  DRESSING / WOUND CARE / SHOWERING  Keep the surgical dressing until follow up.  The dressing is water proof, so you can shower without any extra covering.  IF THE DRESSING FALLS OFF or the wound gets wet inside, change the dressing with sterile gauze.  Please use good hand washing techniques before changing the dressing.  Do not use any lotions or creams on the incision until instructed by your surgeon.    ACTIVITY  Increase activity slowly as tolerated, but follow the weight bearing instructions below.   No driving for 6 weeks or until further direction given by your physician.  You  cannot drive while taking narcotics.  No lifting or carrying greater than 10 lbs. until further directed by your surgeon. Avoid periods of inactivity such as sitting longer than an hour when not asleep. This helps prevent blood clots.  You may return to work once you are authorized by your doctor.     WEIGHT BEARING   Weight bearing as tolerated with assist device (walker, cane, etc) as directed, use it as long as suggested by your surgeon or therapist, typically at least 2-3 weeks.   EXERCISES  Results after joint replacement surgery are often greatly improved when you follow the exercise, range of motion and muscle strengthening exercises prescribed by your doctor. Safety measures are also important to protect the joint from further injury. Any time any of these exercises cause you to have increased pain or swelling, decrease what you are doing until you are comfortable again and then slowly increase them. If you have problems or questions, call your caregiver or physical therapist for advice.   Rehabilitation is important following a joint replacement. After just a few days of immobilization, the muscles of the leg can become weakened and shrink (atrophy).  These exercises are designed to build up the tone and strength of the thigh and leg muscles and to improve motion. Often times heat used for twenty to thirty minutes before working out will loosen up your tissues and help with improving the range of motion but do not use heat for the first two weeks following surgery (sometimes heat can increase post-operative swelling).   These exercises can be done on a training (exercise) mat, on the floor, on a table or on a bed. Use whatever works the best and is most comfortable for you.    Use music or television while you are exercising so that the exercises are a pleasant break in your day. This will make your life better with the exercises acting as a break in your routine that you can look forward  to.   Perform all exercises about fifteen times, three times per day or as directed.  You should exercise both the operative leg and the other leg as well.   Exercises include:  Quad Sets - Tighten up the muscle on the front of the thigh (Quad) and hold for 5-10 seconds.   Straight Leg Raises - With your knee straight (if you were given a brace, keep it on), lift the leg to 60 degrees, hold for 3 seconds, and slowly lower the leg.  Perform this exercise against resistance later as your leg gets stronger.  Leg Slides: Lying on your back, slowly slide your foot toward  your buttocks, bending your knee up off the floor (only go as far as is comfortable). Then slowly slide your foot back down until your leg is flat on the floor again.  Angel Wings: Lying on your back spread your legs to the side as far apart as you can without causing discomfort.  Hamstring Strength:  Lying on your back, push your heel against the floor with your leg straight by tightening up the muscles of your buttocks.  Repeat, but this time bend your knee to a comfortable angle, and push your heel against the floor.  You may put a pillow under the heel to make it more comfortable if necessary.   A rehabilitation program following joint replacement surgery can speed recovery and prevent re-injury in the future due to weakened muscles. Contact your doctor or a physical therapist for more information on knee rehabilitation.    CONSTIPATION  Constipation is defined medically as fewer than three stools per week and severe constipation as less than one stool per week.  Even if you have a regular bowel pattern at home, your normal regimen is likely to be disrupted due to multiple reasons following surgery.  Combination of anesthesia, postoperative narcotics, change in appetite and fluid intake all can affect your bowels.   YOU MUST use at least one of the following options; they are listed in order of increasing strength to get the job  done.  They are all available over the counter, and you may need to use some, POSSIBLY even all of these options:    Drink plenty of fluids (prune juice may be helpful) and high fiber foods Colace 100 mg by mouth twice a day  Senokot for constipation as directed and as needed Dulcolax (bisacodyl), take with full glass of water  Miralax (polyethylene glycol) once or twice a day as needed.  If you have tried all these things and are unable to have a bowel movement in the first 3-4 days after surgery call either your surgeon or your primary doctor.    If you experience loose stools or diarrhea, hold the medications until you stool forms back up.  If your symptoms do not get better within 1 week or if they get worse, check with your doctor.  If you experience "the worst abdominal pain ever" or develop nausea or vomiting, please contact the office immediately for further recommendations for treatment.   ITCHING:  If you experience itching with your medications, try taking only a single pain pill, or even half a pain pill at a time.  You can also use Benadryl over the counter for itching or also to help with sleep.   TED HOSE STOCKINGS:  Use stockings on both legs until for at least 2 weeks or as directed by physician office. They may be removed at night for sleeping.  MEDICATIONS:  See your medication summary on the "After Visit Summary" that nursing will review with you.  You may have some home medications which will be placed on hold until you complete the course of blood thinner medication.  It is important for you to complete the blood thinner medication as prescribed.  PRECAUTIONS:  If you experience chest pain or shortness of breath - call 911 immediately for transfer to the hospital emergency department.   If you develop a fever greater that 101 F, purulent drainage from wound, increased redness or drainage from wound, foul odor from the wound/dressing, or calf pain - CONTACT YOUR SURGEON.  FOLLOW-UP APPOINTMENTS:  If you do not already have a post-op appointment, please call the office for an appointment to be seen by your surgeon.  Guidelines for how soon to be seen are listed in your "After Visit Summary", but are typically between 1-4 weeks after surgery.  OTHER INSTRUCTIONS:   Knee Replacement:  Do not place pillow under knee, focus on keeping the knee straight while resting. CPM instructions: 0-90 degrees, 2 hours in the morning, 2 hours in the afternoon, and 2 hours in the evening. Place foam block, curve side up under heel at all times except when in CPM or when walking.  DO NOT modify, tear, cut, or change the foam block in any way.  MAKE SURE YOU:  Understand these instructions.  Get help right away if you are not doing well or get worse.    Thank you for letting us be a part of your medical care team.  It is a privilege we respect greatly.  We hope these instructions will help you stay on track for a fast and full recovery!   Do not put a pillow under the knee. Place it under the heel.    Complete by:  As directed    Place gray foam block, curve side up under heel at all times except when in CPM or when walking.  DO NOT modify, tear, cut, or change in any way the gray foam block.   Increase activity slowly as tolerated    Complete by:  As directed    Patient may shower    Complete by:  As directed    Aquacel dressing is water proof    Wash over it and the whole leg with soap and water at the end of your shower   TED hose    Complete by:  As directed    Use stockings (TED hose) for 2 weeks on both leg(s).  You may remove them at night for sleeping.      Follow-up Information    Lorn Junes, MD Follow up on 09/20/2016.   Specialty:  Orthopedic Surgery Why:  appt time 2 pm Contact information: [address] [place] [address] [phone]            Signed: Linda Hedges 09/07/2016, 8:25 AM

## 2016-09-07 NOTE — Progress Notes (Signed)
Orthopedic Tech Progress Note Patient Details:  Yvette Joyce May 22, 1961 [id number]  CPM Left Knee CPM Left Knee: On Left Knee Flexion (Degrees): 90 Left Knee Extension (Degrees): 0 Additional Comments: Placed CPM on 0-90 (Pt Left Leg/knee) Pt tolerated well.   [name] 09/07/2016, 1:27 PM

## 2016-09-07 NOTE — Progress Notes (Signed)
Orthopedic Tech Progress Note Patient Details:  Yvette Joyce [birth year] [id number]  Patient ID: Yvette Joyce, female   DOB: Nov 29, 1960, 56 y.o.   MRN: [id number] Applied cpm 0-90  [name] 09/07/2016, 6:26 AM

## 2016-09-07 NOTE — Progress Notes (Signed)
Pt is alert and oriented discharged home with spouse, no distress with equipment transported.

## 2016-09-07 NOTE — Progress Notes (Signed)
Pt is alert and oriented, PA Rounded and walked patient tolerated well, currently sitting up in the chair, with a pain level of a 8 gave medication, and elevated left leg. Poss discharge today if tolerates meals.

## 2016-09-07 NOTE — Evaluation (Signed)
Occupational Therapy Evaluation and Discharge Patient Details Name: Yvette Joyce MRN: [id number] DOB: December 08, 1960 Today's Date: 09/07/2016    History of Present Illness Admitted for L TKA, WBAT;  has a past medical history of Adult attention deficit disorder; Arthritis; Complication of anesthesia; and Trigeminal neuralgia pain.    Clinical Impression   This 56 yo female admitted and underwent above presents to acute OT with all education completed, acute OT will sign off.    Follow Up Recommendations  No OT follow up;Supervision/Assistance - 24 hour    Equipment Recommendations  3 in 1 bedside commode       Precautions / Restrictions Precautions Precautions: Knee Restrictions Weight Bearing Restrictions: No LLE Weight Bearing: Weight bearing as tolerated      Mobility Bed Mobility               General bed mobility comments: Pt up in recliner upon arrival  Transfers Overall transfer level: Needs assistance Equipment used: Rolling walker (2 wheeled) Transfers: Sit to/from Stand Sit to Stand: Min gurad A                   ADL Overall ADL's : Needs assistance/impaired Eating/Feeding: Independent;Sitting   Grooming: Set up;Supervision/safety;Sitting   Upper Body Bathing: Set up;Supervision/ safety;Sitting   Lower Body Bathing: Minimal assistance Lower Body Bathing Details (indicate cue type and reason): minguard A sit<>stand Upper Body Dressing : Supervision/safety;Set up;Sitting   Lower Body Dressing: Min guard;Sit to/from stand   Toilet Transfer: Supervision/safety;Ambulation;RW;Comfort height toilet;Grab bars   Toileting- Clothing Manipulation and Hygiene: Supervision/safety;Sit to/from stand     Tub/Shower Transfer Details (indicate cue type and reason): We discussed placing her 3n1 facing out toward door and backing up to sit on 3n1 to make it easier to get and out of shower   General ADL Comments: Pt educated on which leg to put in first  for underwear and pants               Pertinent Vitals/Pain Pain Assessment: 0-10 Pain Score: 5  Pain Location: left knee Pain Descriptors / Indicators: Sore Pain Intervention(s): Monitored during session;Repositioned;Premedicated before session     Hand Dominance Right   Extremity/Trunk Assessment Upper Extremity Assessment Upper Extremity Assessment: Overall WFL for tasks assessed           Communication Communication Communication: No difficulties   Cognition Arousal/Alertness: Awake/alert Behavior During Therapy: WFL for tasks assessed/performed Overall Cognitive Status: Within Functional Limits for tasks assessed                                Home Living Family/patient expects to be discharged to:: Private residence Living Arrangements: Alone Available Help at Discharge: Other (Comment);Available 24 hours/day (boyfriend) Type of Home: House Home Access: Stairs to enter [employer] of Steps: 5 Entrance Stairs-Rails: Right;Left;Can reach both Home Layout: Multi-level;1/2 bath on main level (bed moved downstairs)     Bathroom Shower/Tub: Walk-in shower;Door   [employer]: Standard                Prior Functioning/Environment Level of Independence: Independent        Comments: likes golf        OT Problem List: Decreased strength;Decreased range of motion;Impaired balance (sitting and/or standing)      OT Goals(Current goals can be found in the care plan section) Acute Rehab OT Goals Patient Stated Goal: home today  OT Frequency:  End of Session Equipment Utilized During Treatment: Rolling walker  Activity Tolerance: Patient tolerated treatment well Patient left: in chair;with call bell/phone within reach   Time: 0830-0921 OT Time Calculation (min): 51 min Charges:  OT General Charges $OT Visit: 1 Procedure OT Evaluation $OT Eval Moderate Complexity: 1 Procedure OT Treatments $Self  Care/Home Management : 23-37 mins   Almon Register [id number] 09/07/2016, 11:05 AM

## 2016-09-07 NOTE — Care Management Note (Signed)
Case Management Note  Patient Details  Name: Yvette Joyce MRN: [id number] Date of Birth: July 14, 1961  Subjective/Objective:  56 yr old female s/p left total knee arthroplasty.                  Action/Plan: Patient was preoperatively setup with [facility], no changes. DME has been delivered to her home.    Expected Discharge Date:  09/07/16               Expected Discharge Plan:  [place]  In-House Referral:  NA  Discharge planning Services  CM Consult  Post Acute Care Choice:  Durable Medical Equipment, Home Health Choice offered to:  Patient  DME Arranged:  3-N-1, CPM, Walker rolling DME Agency:  [employer]  HH Arranged:  PT [place]:  [facility] [employer]  Status of Service:  Completed, signed off  If discussed at [employer], dates discussed:    Additional Comments:  Ninfa Meeker, RN 09/07/2016, 4:32 PM

## 2016-09-07 NOTE — Progress Notes (Signed)
Physical Therapy Treatment Patient Details Name: Yvette Joyce MRN: [id number] DOB: 03-18-1961 Today's Date: 09/07/2016    History of Present Illness Admitted for L TKA, WBAT;  has a past medical history of Adult attention deficit disorder; Arthritis; Complication of anesthesia; and Trigeminal neuralgia pain.     PT Comments    Excellent progress; Nice, stable knee in stance; Stair training complete; OK for dc home from PT standpoint; If she is still here this pm, will focus on therex  Follow Up Recommendations  Home health PT;Supervision/Assistance - 24 hour     Equipment Recommendations  Rolling walker with 5" wheels;3in1 (PT)    Recommendations for Other Services       Precautions / Restrictions Precautions Precautions: Knee Precaution Booklet Issued: Yes (comment) Precaution Comments: Pt educated to not allow any pillow or bolster under knee for healing with optimal range of motion.  Restrictions Weight Bearing Restrictions: No LLE Weight Bearing: Weight bearing as tolerated    Mobility  Bed Mobility               General bed mobility comments: Pt up in recliner upon arrival  Transfers Overall transfer level: Needs assistance Equipment used: Rolling walker (2 wheeled) Transfers: Sit to/from Stand Sit to Stand: Supervision         General transfer comment: Cues to allow for knee flexion with transition; very good rise  Ambulation/Gait Ambulation/Gait assistance: Supervision Ambulation Distance (Feet): 200 Feet Assistive device: Rolling walker (2 wheeled) Gait Pattern/deviations: Step-through pattern     General Gait Details: Cues for gait sequence and to activate quad for stance stability   Stairs Stairs: Yes   Stair Management: Two rails;Step to pattern;Forwards;One rail Left;Sideways Number of Stairs: 5 (12) General stair comments: Cues for sequence; practiced 5 steps to enter home with 2 rails and then flight with one rail L; no  difficulty  Wheelchair Mobility    Modified Rankin (Stroke Patients Only)       Balance                                    Cognition Arousal/Alertness: Awake/alert Behavior During Therapy: WFL for tasks assessed/performed Overall Cognitive Status: Within Functional Limits for tasks assessed                      Exercises      General Comments        Pertinent Vitals/Pain Pain Assessment: 0-10 Pain Score: 4  Pain Location: left knee Pain Descriptors / Indicators: Sore Pain Intervention(s): Monitored during session    Home Living Family/patient expects to be discharged to:: Private residence Living Arrangements: Alone Available Help at Discharge: Other (Comment);Available 24 hours/day (boyfriend) Type of Home: House Home Access: Stairs to enter Entrance Stairs-Rails: Right;Left;Can reach both Home Layout: Multi-level;1/2 bath on main level (bed moved downstairs)        Prior Function Level of Independence: Independent      Comments: likes golf   PT Goals (current goals can now be found in the care plan section) Acute Rehab PT Goals Patient Stated Goal: home today PT Goal Formulation: With patient Time For Goal Achievement: 09/13/16 Potential to Achieve Goals: Good Progress towards PT goals: Progressing toward goals    Frequency    7X/week      PT Plan Current plan remains appropriate    Co-evaluation  End of Session Equipment Utilized During Treatment: Gait belt Activity Tolerance: Patient tolerated treatment well Patient left: in chair;with call bell/phone within reach     Time: 0912-0945 PT Time Calculation (min) (ACUTE ONLY): 33 min  Charges:  $Gait Training: 23-37 mins                    G Codes:      Colletta Maryland 09/10/2016, 11:54 AM  Roney Marion, [place] Pager [phone] Office [phone]

## 2016-09-07 NOTE — Progress Notes (Signed)
Physical Therapy Treatment Patient Details Name: Yvette Joyce MRN: [id number] DOB: [birth year] Today's Date: 09/07/2016    History of Present Illness Admitted for L TKA, WBAT;  has a past medical history of Adult attention deficit disorder; Arthritis; Complication of anesthesia; and Trigeminal neuralgia pain.     PT Comments    Session focused on therex and progressive amb; excellent knee control and AROM for POD 1; Managing well; boyfriend present for session as well; questions answered; OK for dc home from PT standpoint; Notified RN  Follow Up Recommendations  Home health PT;Supervision/Assistance - 24 hour     Equipment Recommendations  Rolling walker with 5" wheels;3in1 (PT) (delivered to room)    Recommendations for Other Services       Precautions / Restrictions Precautions Precautions: Knee Precaution Booklet Issued: Yes (comment) Precaution Comments: Pt educated to not allow any pillow or bolster under knee for healing with optimal range of motion.  Restrictions LLE Weight Bearing: Weight bearing as tolerated    Mobility  Bed Mobility Overal bed mobility: Needs Assistance Bed Mobility: Supine to Sit     Supine to sit: Supervision     General bed mobility comments: no difficulty  Transfers Overall transfer level: Needs assistance Equipment used: Rolling walker (2 wheeled) Transfers: Sit to/from Stand Sit to Stand: Supervision         General transfer comment: Cues to allow for knee flexion with transition; very good rise  Ambulation/Gait Ambulation/Gait assistance: Supervision Ambulation Distance (Feet): 400 Feet Assistive device: Rolling walker (2 wheeled) Gait Pattern/deviations: Step-through pattern Gait velocity: slowed   General Gait Details: Cues for gait sequence and to activate quad for stance stability   Stairs Stairs: Yes   Stair Management: Two rails;Step to pattern;Forwards;One rail Left;Sideways Number of Stairs: 5 (12) General  stair comments: Cues for sequence; practiced 5 steps to enter home with 2 rails and then flight with one rail L; no difficulty  Wheelchair Mobility    Modified Rankin (Stroke Patients Only)       Balance                                    Cognition Arousal/Alertness: Awake/alert Behavior During Therapy: WFL for tasks assessed/performed Overall Cognitive Status: Within Functional Limits for tasks assessed                      Exercises Total Joint Exercises Quad Sets: AROM;Left;10 reps Short Arc Quad: AROM;Left;10 reps Heel Slides: AROM;Left;10 reps Straight Leg Raises: AROM;Left;10 reps Long Arc Quad: AROM;Left;10 reps Goniometric ROM: approx 2-90    General Comments        Pertinent Vitals/Pain Pain Assessment: 0-10 Pain Score: 4  Pain Location: left knee Pain Descriptors / Indicators: Sore Pain Intervention(s): Monitored during session    Home Living                      Prior Function            PT Goals (current goals can now be found in the care plan section) Acute Rehab PT Goals Patient Stated Goal: home today PT Goal Formulation: With patient Time For Goal Achievement: 09/13/16 Potential to Achieve Goals: Good Progress towards PT goals: Progressing toward goals    Frequency    7X/week      PT Plan Current plan remains appropriate    Co-evaluation  End of Session Equipment Utilized During Treatment: Gait belt Activity Tolerance: Patient tolerated treatment well Patient left: in bed;with call bell/phone within reach     Time: 1330-1405 PT Time Calculation (min) (ACUTE ONLY): 35 min  Charges:  $Gait Training: 8-22 mins $Therapeutic Exercise: 8-22 mins                    G Codes:      Colletta Maryland 2016/09/22, 2:45 PM  Roney Marion, [place] Pager [phone] Office [phone]

## 2016-09-08 ENCOUNTER — Inpatient Hospital Stay: Admit: 2016-09-08 | Payer: 59 | Admitting: Orthopedic Surgery

## 2016-09-08 DIAGNOSIS — M1712 Unilateral primary osteoarthritis, left knee: Secondary | ICD-10-CM | POA: Diagnosis not present

## 2016-09-08 DIAGNOSIS — G500 Trigeminal neuralgia: Secondary | ICD-10-CM | POA: Diagnosis not present

## 2016-09-08 DIAGNOSIS — Z96652 Presence of left artificial knee joint: Secondary | ICD-10-CM | POA: Diagnosis not present

## 2016-09-08 DIAGNOSIS — Z471 Aftercare following joint replacement surgery: Secondary | ICD-10-CM | POA: Diagnosis not present

## 2016-09-08 DIAGNOSIS — M1991 Primary osteoarthritis, unspecified site: Secondary | ICD-10-CM | POA: Diagnosis not present

## 2016-09-08 SURGERY — ARTHROPLASTY, KNEE, TOTAL
Anesthesia: General | Laterality: Left

## 2016-09-10 DIAGNOSIS — M1991 Primary osteoarthritis, unspecified site: Secondary | ICD-10-CM | POA: Diagnosis not present

## 2016-09-10 DIAGNOSIS — Z471 Aftercare following joint replacement surgery: Secondary | ICD-10-CM | POA: Diagnosis not present

## 2016-09-10 DIAGNOSIS — G500 Trigeminal neuralgia: Secondary | ICD-10-CM | POA: Diagnosis not present

## 2016-09-13 DIAGNOSIS — M1991 Primary osteoarthritis, unspecified site: Secondary | ICD-10-CM | POA: Diagnosis not present

## 2016-09-13 DIAGNOSIS — G500 Trigeminal neuralgia: Secondary | ICD-10-CM | POA: Diagnosis not present

## 2016-09-13 DIAGNOSIS — Z471 Aftercare following joint replacement surgery: Secondary | ICD-10-CM | POA: Diagnosis not present

## 2016-09-15 DIAGNOSIS — M1991 Primary osteoarthritis, unspecified site: Secondary | ICD-10-CM | POA: Diagnosis not present

## 2016-09-15 DIAGNOSIS — Z471 Aftercare following joint replacement surgery: Secondary | ICD-10-CM | POA: Diagnosis not present

## 2016-09-15 DIAGNOSIS — G500 Trigeminal neuralgia: Secondary | ICD-10-CM | POA: Diagnosis not present

## 2016-09-16 DIAGNOSIS — M1712 Unilateral primary osteoarthritis, left knee: Secondary | ICD-10-CM | POA: Diagnosis not present

## 2016-09-17 DIAGNOSIS — Z471 Aftercare following joint replacement surgery: Secondary | ICD-10-CM | POA: Diagnosis not present

## 2016-09-17 DIAGNOSIS — G500 Trigeminal neuralgia: Secondary | ICD-10-CM | POA: Diagnosis not present

## 2016-09-17 DIAGNOSIS — M1991 Primary osteoarthritis, unspecified site: Secondary | ICD-10-CM | POA: Diagnosis not present

## 2016-09-20 DIAGNOSIS — M1991 Primary osteoarthritis, unspecified site: Secondary | ICD-10-CM | POA: Diagnosis not present

## 2016-09-20 DIAGNOSIS — Z471 Aftercare following joint replacement surgery: Secondary | ICD-10-CM | POA: Diagnosis not present

## 2016-09-20 DIAGNOSIS — G500 Trigeminal neuralgia: Secondary | ICD-10-CM | POA: Diagnosis not present

## 2016-09-22 DIAGNOSIS — Z471 Aftercare following joint replacement surgery: Secondary | ICD-10-CM | POA: Diagnosis not present

## 2016-09-22 DIAGNOSIS — G500 Trigeminal neuralgia: Secondary | ICD-10-CM | POA: Diagnosis not present

## 2016-09-22 DIAGNOSIS — M1991 Primary osteoarthritis, unspecified site: Secondary | ICD-10-CM | POA: Diagnosis not present

## 2016-09-24 DIAGNOSIS — G500 Trigeminal neuralgia: Secondary | ICD-10-CM | POA: Diagnosis not present

## 2016-09-24 DIAGNOSIS — Z471 Aftercare following joint replacement surgery: Secondary | ICD-10-CM | POA: Diagnosis not present

## 2016-09-24 DIAGNOSIS — M1991 Primary osteoarthritis, unspecified site: Secondary | ICD-10-CM | POA: Diagnosis not present

## 2016-09-27 ENCOUNTER — Ambulatory Visit: Payer: 59 | Admitting: Adult Health

## 2016-09-28 ENCOUNTER — Ambulatory Visit (HOSPITAL_COMMUNITY): Payer: 59 | Attending: Orthopedic Surgery | Admitting: Physical Therapy

## 2016-09-28 DIAGNOSIS — M25662 Stiffness of left knee, not elsewhere classified: Secondary | ICD-10-CM | POA: Insufficient documentation

## 2016-09-28 DIAGNOSIS — R262 Difficulty in walking, not elsewhere classified: Secondary | ICD-10-CM | POA: Diagnosis present

## 2016-09-28 DIAGNOSIS — M6281 Muscle weakness (generalized): Secondary | ICD-10-CM | POA: Insufficient documentation

## 2016-09-28 NOTE — Patient Instructions (Signed)
  PRONE KNEE HANGS  While lying down on your stomach, allow your leg to hang off the end of a table/bed. Position yourself so that your knee cap is just over the end of the table/bed.   Just relax your body and allow gravity to stretch your knee into a more straightened position.  Add an ankle weight for increased stretch.  5 reps, 30-60 sec, 2x/day   HAMSTRING STRETCH WITH TOWEL  While lying down on your back, hook a towel or strap under  your foot and draw up your leg until a stretch is felt under your leg. calf area.  Keep your knee in a straightened position during the stretch.  5 reps, 30-60 sec, 2x/day   Knee Flexion Stretch on Step  Place foot on step and lean forward until you feel a good stretch in front of knee.   3-5 reps, 15-30 sec, 2x/day

## 2016-09-28 NOTE — Therapy (Signed)
[place], [place], [address] Phone: [phone]   Fax:  [phone]  Physical Therapy Evaluation  Patient Details  Name: Yvette Joyce MRN: [id number] Date of Birth: [birth year] Referring Provider: Elsie Saas, MD  Encounter Date: 09/28/2016      PT End of Session - 09/28/16 1039    Visit Number 1   Number of Visits 17   Date for PT Re-Evaluation 10/26/16   Authorization Type [employer]   PT Start Time [phone]   PT Stop Time 1035   PT Time Calculation (min) 48 min   Activity Tolerance Patient tolerated treatment well   Behavior During Therapy [facility] for tasks assessed/performed      Past Medical History:  Diagnosis Date  . Adult attention deficit disorder   . Arthritis   . Complication of anesthesia    "hard to wake up"  . Constipation   . Fatigue 03/07/2015  . Headache(784.0)   . Hematuria 04/12/2013  . Hypothyroidism   . Postmenopause 03/07/2015  . Primary localized osteoarthritis of left knee 08/26/2016  . Sleep disturbance 03/07/2015  . Snoring   . Thyroid disease   . Trigeminal neuralgia pain     Past Surgical History:  Procedure Laterality Date  . ABDOMINAL HYSTERECTOMY    . CARPAL TUNNEL RELEASE Right 07/2016  . COLONOSCOPY  09/24/2011   [id number] polyps in the ascending colon/diverticula in the sigmoid colon/internal hemorrhoids  . cosmetic eye surgery    . fibroids removed from uterus    . HYSTEROSCOPY W/D&C  12/03/2011   Procedure: DILATATION AND CURETTAGE /HYSTEROSCOPY;  Surgeon: Jonnie Kind, MD;  Location: [facility];  Service: Gynecology;  Laterality: N/A;  . KNEE ARTHROSCOPY W/ DEBRIDEMENT    . LAPAROSCOPIC SALPINGO OOPHERECTOMY  07/11/2012   Procedure: LAPAROSCOPIC SALPINGO OOPHORECTOMY;  Surgeon: Jonnie Kind, MD;  Location: [facility];  Service: Gynecology;  Laterality: Bilateral;  . LAPAROSCOPIC SUPRACERVICAL HYSTERECTOMY  07/11/2012   Procedure: LAPAROSCOPIC SUPRACERVICAL  HYSTERECTOMY;  Surgeon: Jonnie Kind, MD;  Location: [facility];  Service: Gynecology;  Laterality: N/A;  . LASIK    . NASAL SEPTUM SURGERY    . POLYPECTOMY  12/03/2011   Procedure: POLYPECTOMY;  Surgeon: Jonnie Kind, MD;  Location: [facility];  Service: Gynecology;  Laterality: N/A;  resection of polyp  . TOTAL KNEE ARTHROPLASTY Left 09/06/2016  . TOTAL KNEE ARTHROPLASTY Left 09/06/2016   Procedure: TOTAL KNEE ARTHROPLASTY;  Surgeon: Elsie Saas, MD;  Location: [place];  Service: Orthopedics;  Laterality: Left;  Marland Kitchen VEIN SURGERY     Varicose veins    There were no vitals filed for this visit.       Subjective Assessment - 09/28/16 0952    Subjective Pt states that she fell last Spring when her L knee gave out, which she states started this whole process. She had a L TKA on 09/06/16. Prior to the surgery, she had pain all the time, with no relief, difficulty with walking, standing, and stairs. She did not use an AD prior to surgery. She had HHPT following surgery and she reports that she is still performing exercises from that. She was working full time prior to this. She states that she has a lot of pain at night but it hurts just about all the time. She also reports that she is not sleeping well at night either.   Patient is accompained by: Family member  significant other, Danny   Limitations Standing;Walking;Sitting;Lifting  How long can you sit comfortably? "not long"   How long can you stand comfortably? 40 mins, "not long"   How long can you walk comfortably? "5 mins or less"   Patient Stated Goals "be able to be normal, run across the street at work so a car doesn't hit me, and be pain free"   Currently in Pain? Yes   Pain Score 5    Pain Location Knee   Pain Orientation Left   Pain Descriptors / Indicators Dull   Pain Type Acute pain;Surgical pain   Pain Onset 1 to 4 weeks ago   Aggravating Factors  moving it the wrong way   Pain Relieving Factors resting, ice, pain medicine    Effect of Pain on Daily Activities moderate effect; has some difficulty completing all ADLs in one sitting            [facility] PT Assessment - 09/28/16 0001      Assessment   Medical Diagnosis L TKR   Referring Provider Elsie Saas, MD   Onset Date/Surgical Date 09/06/16   Next MD Visit 10/21/16   Prior Therapy here for L knee after arthorscopic in 2015     Precautions   Precautions None     Restrictions   Weight Bearing Restrictions No     Balance Screen   Has the patient fallen in the past 6 months No   Has the patient had a decrease in activity level because of a fear of falling?  Yes   Is the patient reluctant to leave their home because of a fear of falling?  No     Prior Function   Level of Independence Independent;Independent with basic ADLs   Vocation Full time employment   Leisure hiking, yoga, golf     Observation/Other Assessments   Skin Integrity scar and overall skin assessment looks well healed with no open wounds other than a few tiny scab around incision; mild scar immobility especially at patella     ROM / Strength   AROM / PROM / Strength AROM;Strength     AROM   Right Knee Extension 0   Right Knee Flexion 145   Left Knee Extension -5   Left Knee Flexion 117     Strength   Right Hip Flexion 4/5   Right Hip ABduction 4+/5   Left Hip Flexion 5/5   Left Hip ABduction 4/5   Right Knee Flexion 5/5   Right Knee Extension 5/5   Left Knee Flexion 4-/5   Left Knee Extension 4/5   Right Ankle Dorsiflexion 5/5   Left Ankle Dorsiflexion 4+/5     Palpation   Patella mobility decreased with superior/inferior direction; medial/lateral WFL   Palpation comment increased soft tissue restrictions and mild TTP of distal quad, distal HS and proximal gastroc; increased swelling of L knee via gross assessment     Ambulation/Gait   Ambulation/Gait Yes   Ambulation Distance (Feet) 286 Feet  3MWT, SPC   Assistive device Straight cane   Gait Comments decreased L  knee extension t/o gait, mild trendelenberg on L, decreased hip flexion on L and increased hip flexion on R t/o gait     Standardized Balance Assessment   Standardized Balance Assessment Timed Up and Go Test  SLS: 3 sec BLE     Timed Up and Go Test   TUG Normal TUG   Normal TUG (seconds) 25.8  SPC  PT Education - 09/28/16 1040    Education provided Yes   Education Details exam findings, POC, HEP   Person(s) Educated Patient;Other (comment)  significant other   Methods Explanation;Demonstration;Handout   Comprehension Verbalized understanding;Returned demonstration          PT Short Term Goals - 09/28/16 1101      PT SHORT TERM GOAL #1   Title Pt will be independent with HEP and tolerate progressions well to demonstrate improved overall function and strength.   Time 3   Period Weeks   Status New     PT SHORT TERM GOAL #2   Title Pt will have improved knee AROM from 0-130 to maximize gait and stair climbing.    Baseline 09/28/16: -5 deg ext, 117 deg flexion   Time 3   Period Weeks   Status New     PT SHORT TERM GOAL #3   Title Pt will be able to have improved TUG time to <20 sec with LRAD to demonstrate improved strength, balance, and overall function.   Baseline 09/28/16: 25.8 sec with SPC   Time 3   Period Weeks   Status New     PT SHORT TERM GOAL #4   Title Pt will have improved 3MWT by >100 ft with LRAD to demonstrate improved endurance, balance, and overall function.   Baseline 09/28/16: 286 ft with SPC   Time 3   Period Weeks   Status New           PT Long Term Goals - 09/28/16 1103      PT LONG TERM GOAL #1   Title Pt will have improved L knee AROM from 0-145 deg to facilitate improved gait pattern and demonstrate improved.   Baseline 09/28/16: -5 deg ext, 117 deg flex   Time 8   Period Weeks   Status New     PT LONG TERM GOAL #2   Title Pt will be able to sleep through the night with <2 awakenings due  to pain to demonstrate improved overall function.   Time 8   Period Weeks   Status New     PT LONG TERM GOAL #3   Title Pt will have improved TUG time to <10 sec without and assistive device to demonstrate improved strength, balance, and overall function.   Baseline 09/28/16: 25.8 sec with SPC   Time 8   Period Weeks   Status New     PT LONG TERM GOAL #4   Title Pt will have improved 3MWT without and assistive device by >300 ft to demonstrate improved endurance, balance, and overall function.   Baseline 09/28/16: 286 ft with SPC   Time 8   Period Weeks   Status New     PT LONG TERM GOAL #5   Title Pt will have improved SLS on BLE to 10 sec or greater to demonstrate improved overall balance and function.   Time 8   Period Weeks   Status New               Plan - 09/28/16 1055    Clinical Impression Statement Pt is pleasant 56 YO F who presents to therapy s/p L TKR on 09/06/2016. She has deficits in L knee AROM, functional and overall strength of the L hip and knee, as well as deficits in gait and balance. Pt also has mild increased soft tissue restrictions of surrounding knee musculature, mild patalla hypomobility, and min-mod swelling at the joint line per gross  assessment. Pt needs skilled PT intervention to maximize overall function at home and in the community.   Rehab Potential Good   Clinical Impairments Affecting Rehab Potential (+): young, motivated, active lifestyle prior to surgery, familial support   PT Frequency 2x / week   PT Duration 8 weeks   PT Treatment/Interventions ADLs/Self Care Home Management;Cryotherapy;Electrical Stimulation;Gait training;Stair training;Functional mobility training;Therapeutic activities;Therapeutic exercise;Balance training;Neuromuscular re-education;Patient/family education;Manual techniques;Scar mobilization;Passive range of motion;Taping   PT Next Visit Plan Review eval and goals, measure swelling of L knee, manual therapy to improve  AROM, initiate strengthening    PT Home Exercise Plan Eval: HS stretch with strap, prone hangs, knee flexion stretch on step   Consulted and Agree with Plan of Care Patient;Family member/caregiver      Patient will benefit from skilled therapeutic intervention in order to improve the following deficits and impairments:  Abnormal gait, Decreased activity tolerance, Decreased balance, Decreased range of motion, Decreased strength, Difficulty walking, Hypomobility, Increased edema, Pain  Visit Diagnosis: Difficulty in walking, not elsewhere classified - Plan: PT plan of care cert/re-cert  Muscle weakness (generalized) - Plan: PT plan of care cert/re-cert  Stiffness of left knee, not elsewhere classified - Plan: PT plan of care cert/re-cert     Problem List Patient Active Problem List   Diagnosis Date Noted  . Primary localized osteoarthritis of left knee 08/26/2016  . Trigeminal neuralgia pain   . Fatigue 03/07/2015  . Sleep disturbance 03/07/2015  . Postmenopause 03/07/2015  . Hemorrhoids 11/13/2014  . Knee pain, acute 05/01/2014  . Left leg weakness 05/01/2014  . Hypothyroid 02/28/2014  . Dilated cbd, acquired 06/22/2013  . Hematuria 04/12/2013    Geraldine Solar PT, DPT  [place] [address] [place], [place], [address] Phone: [phone]   Fax:  [phone]  Name: PHRONIA ASCH MRN: [id number] Date of Birth: 1961/01/09

## 2016-09-29 ENCOUNTER — Ambulatory Visit (HOSPITAL_COMMUNITY): Payer: 59

## 2016-09-29 ENCOUNTER — Telehealth (HOSPITAL_COMMUNITY): Payer: Self-pay | Admitting: Physical Therapy

## 2016-09-29 DIAGNOSIS — M6281 Muscle weakness (generalized): Secondary | ICD-10-CM

## 2016-09-29 DIAGNOSIS — R262 Difficulty in walking, not elsewhere classified: Secondary | ICD-10-CM | POA: Diagnosis not present

## 2016-09-29 DIAGNOSIS — M25662 Stiffness of left knee, not elsewhere classified: Secondary | ICD-10-CM

## 2016-09-29 NOTE — Telephone Encounter (Signed)
Needed to change her appt date

## 2016-09-29 NOTE — Therapy (Signed)
[facility], [place], [address] Phone: [phone]   Fax:  [phone]  Physical Therapy Treatment  Patient Details  Name: Yvette Joyce MRN: [id number] Date of Birth: 10-12-1960 Referring Provider: Elsie Saas, MD  Encounter Date: 09/29/2016      PT End of Session - 09/29/16 0912    Visit Number 2   Number of Visits 17   Date for PT Re-Evaluation 10/26/16   Authorization Type [employer]   PT Start Time [phone]   PT Stop Time 0950   PT Time Calculation (min) 45 min   Activity Tolerance Patient tolerated treatment well   Behavior During Therapy [facility] for tasks assessed/performed      Past Medical History:  Diagnosis Date  . Adult attention deficit disorder   . Arthritis   . Complication of anesthesia    "hard to wake up"  . Constipation   . Fatigue 03/07/2015  . Headache(784.0)   . Hematuria 04/12/2013  . Hypothyroidism   . Postmenopause 03/07/2015  . Primary localized osteoarthritis of left knee 08/26/2016  . Sleep disturbance 03/07/2015  . Snoring   . Thyroid disease   . Trigeminal neuralgia pain     Past Surgical History:  Procedure Laterality Date  . ABDOMINAL HYSTERECTOMY    . CARPAL TUNNEL RELEASE Right 07/2016  . COLONOSCOPY  09/24/2011   [id number] polyps in the ascending colon/diverticula in the sigmoid colon/internal hemorrhoids  . cosmetic eye surgery    . fibroids removed from uterus    . HYSTEROSCOPY W/D&C  12/03/2011   Procedure: DILATATION AND CURETTAGE /HYSTEROSCOPY;  Surgeon: Jonnie Kind, MD;  Location: [facility];  Service: Gynecology;  Laterality: N/A;  . KNEE ARTHROSCOPY W/ DEBRIDEMENT    . LAPAROSCOPIC SALPINGO OOPHERECTOMY  07/11/2012   Procedure: LAPAROSCOPIC SALPINGO OOPHORECTOMY;  Surgeon: Jonnie Kind, MD;  Location: [facility];  Service: Gynecology;  Laterality: Bilateral;  . LAPAROSCOPIC SUPRACERVICAL HYSTERECTOMY  07/11/2012   Procedure: LAPAROSCOPIC SUPRACERVICAL  HYSTERECTOMY;  Surgeon: Jonnie Kind, MD;  Location: [facility];  Service: Gynecology;  Laterality: N/A;  . LASIK    . NASAL SEPTUM SURGERY    . POLYPECTOMY  12/03/2011   Procedure: POLYPECTOMY;  Surgeon: Jonnie Kind, MD;  Location: [facility];  Service: Gynecology;  Laterality: N/A;  resection of polyp  . TOTAL KNEE ARTHROPLASTY Left 09/06/2016  . TOTAL KNEE ARTHROPLASTY Left 09/06/2016   Procedure: TOTAL KNEE ARTHROPLASTY;  Surgeon: Elsie Saas, MD;  Location: [place];  Service: Orthopedics;  Laterality: Left;  Marland Kitchen VEIN SURGERY     Varicose veins    There were no vitals filed for this visit.      Subjective Assessment - 09/29/16 0911    Subjective Pt stated sha has began HEP with increased pain following new exercises.  Current pain scale 7/10 Lt knee   Patient Stated Goals "be able to be normal, run across the street at work so a car doesn't hit me, and be pain free"   Currently in Pain? Yes   Pain Score 7    Pain Location Knee   Pain Orientation Left   Pain Descriptors / Indicators Aching;Sore;Dull   Pain Type Acute pain;Surgical pain   Pain Onset 1 to 4 weeks ago   Pain Frequency Intermittent   Aggravating Factors  moving it the wrong way   Pain Relieving Factors resting, ice, pain medicine   Effect of Pain on Daily Activities moderate effect; has some difficutly completeing  all ADLs in one sitting            [facility] PT Assessment - 09/29/16 0001      Observation/Other Assessments-Edema    Edema Circumferential  joint line measurements     Circumferential Edema   Circumferential - Right 13.5   Circumferential - Left  15.5                     [facility] Adult PT Treatment/Exercise - 09/29/16 0001      Ambulation/Gait   Ambulation/Gait Yes   Ambulation Distance (Feet) 226 Feet   Assistive device Straight cane   Gait Comments decreased L knee extension t/o gait, mild trendelenberg on L, decreased hip flexion on L and increased hip flexion on R t/o gait; cueing  for equalized weight bearing, increasing stance phase and BOS     Exercises   Exercises Knee/Hip     Knee/Hip Exercises: Stretches   Active Hamstring Stretch 3 reps;30 seconds   Active Hamstring Stretch Limitations supine with rope   Knee: Self-Stretch to increase Flexion 5 reps   Knee: Self-Stretch Limitations Knee drives on [id number] step 10" holds     Knee/Hip Exercises: Supine   Quad Sets 10 reps   Short Arc Quad Sets 15 reps   Heel Slides 10 reps   Heel Slides Limitations 6-121 degrees     Knee/Hip Exercises: Prone   Prone Knee Hang 2 minutes   Prone Knee Hang Limitations reviewed form      Manual Therapy   Manual Therapy Edema management   Manual therapy comments Manual complete separate rest of tx   Edema Management retro massage with LE elevated                PT Education - 09/29/16 0930    Education provided Yes   Education Details Reviewed goals, compliance with HEP and copy of eval; reviewed RICE for pain control; educated benefits with retro massage for edema control   Person(s) Educated Patient;Spouse   Methods Explanation;Demonstration;Handout   Comprehension Verbalized understanding;Returned demonstration;Need further instruction          PT Short Term Goals - 09/28/16 1101      PT SHORT TERM GOAL #1   Title Pt will be independent with HEP and tolerate progressions well to demonstrate improved overall function and strength.   Time 3   Period Weeks   Status New     PT SHORT TERM GOAL #2   Title Pt will have improved knee AROM from 0-130 to maximize gait and stair climbing.    Baseline 09/28/16: -5 deg ext, 117 deg flexion   Time 3   Period Weeks   Status New     PT SHORT TERM GOAL #3   Title Pt will be able to have improved TUG time to <20 sec with LRAD to demonstrate improved strength, balance, and overall function.   Baseline 09/28/16: 25.8 sec with SPC   Time 3   Period Weeks   Status New     PT SHORT TERM GOAL #4   Title Pt will have  improved 3MWT by >100 ft with LRAD to demonstrate improved endurance, balance, and overall function.   Baseline 09/28/16: 286 ft with SPC   Time 3   Period Weeks   Status New           PT Long Term Goals - 09/28/16 1103      PT LONG TERM GOAL #1   Title Pt  will have improved L knee AROM from 0-145 deg to facilitate improved gait pattern and demonstrate improved.   Baseline 09/28/16: -5 deg ext, 117 deg flex   Time 8   Period Weeks   Status New     PT LONG TERM GOAL #2   Title Pt will be able to sleep through the night with <2 awakenings due to pain to demonstrate improved overall function.   Time 8   Period Weeks   Status New     PT LONG TERM GOAL #3   Title Pt will have improved TUG time to <10 sec without and assistive device to demonstrate improved strength, balance, and overall function.   Baseline 09/28/16: 25.8 sec with SPC   Time 8   Period Weeks   Status New     PT LONG TERM GOAL #4   Title Pt will have improved 3MWT without and assistive device by >300 ft to demonstrate improved endurance, balance, and overall function.   Baseline 09/28/16: 286 ft with SPC   Time 8   Period Weeks   Status New     PT LONG TERM GOAL #5   Title Pt will have improved SLS on BLE to 10 sec or greater to demonstrate improved overall balance and function.   Time 8   Period Weeks   Status New               Plan - 09/29/16 1047    Clinical Impression Statement Reviewed goals, complaince and assured correct form with HEP and copy of eval given to pt.  Began session with retro massage to assist with edema control prior ROM based exercises.  Pt able to complete therex with minimal difficutly, tactile and verbal cueing to relax gluteal activatin and activate quad with quad sets.to improve knee extension.  Pt with high pain scale at entrance, reports vast reduction in pain following manual.  Pt educated on RICE techniques to continue at home to assist with pain and edema control.  Did  measure edema at knee joint line with Lt knee at 15.5 in and Rt at 13.5.  Gait training complete with SPC to improve stance phase and equalize stride length.  EOS pt reports pain reduced to 3/10 with ROM at 6-121 degrees,  Encouraged pt to apply ice once home for pain and edema control.     Rehab Potential Good   Clinical Impairments Affecting Rehab Potential (+): young, motivated, active lifestyle prior to surgery, familial support   PT Frequency 2x / week   PT Duration 8 weeks   PT Treatment/Interventions ADLs/Self Care Home Management;Cryotherapy;Electrical Stimulation;Gait training;Stair training;Functional mobility training;Therapeutic activities;Therapeutic exercise;Balance training;Neuromuscular re-education;Patient/family education;Manual techniques;Scar mobilization;Passive range of motion;Taping   PT Next Visit Plan Continue manual to address edema control; therex with ROM based therex extension>flexion; prior strengthening   PT Home Exercise Plan Eval: HS stretch with strap, prone hangs, knee flexion stretch on step; quad sets   Consulted and Agree with Plan of Care Patient;Family member/caregiver      Patient will benefit from skilled therapeutic intervention in order to improve the following deficits and impairments:  Abnormal gait, Decreased activity tolerance, Decreased balance, Decreased range of motion, Decreased strength, Difficulty walking, Hypomobility, Increased edema, Pain  Visit Diagnosis: Difficulty in walking, not elsewhere classified  Muscle weakness (generalized)  Stiffness of left knee, not elsewhere classified     Problem List Patient Active Problem List   Diagnosis Date Noted  . Primary localized osteoarthritis of left knee 08/26/2016  .  Trigeminal neuralgia pain   . Fatigue 03/07/2015  . Sleep disturbance 03/07/2015  . Postmenopause 03/07/2015  . Hemorrhoids 11/13/2014  . Knee pain, acute 05/01/2014  . Left leg weakness 05/01/2014  . Hypothyroid  02/28/2014  . Dilated cbd, acquired 06/22/2013  . Hematuria 04/12/2013   Ihor Austin, [place]; [place]  Aldona Lento 09/29/2016, 12:17 PM  [facility] [address] [place], [place], [address] Phone: [phone]   Fax:  [phone]  Name: Yvette Joyce MRN: [id number] Date of Birth: 08/20/1960

## 2016-09-29 NOTE — Patient Instructions (Signed)
Quad Sets    Slowly tighten thigh muscles of straight, left leg while counting out loud to 5". Relax. Repeat 10 times. Do 5 sessions per day.  http://gt2.exer.us/293   Copyright  VHI. All rights reserved.

## 2016-10-05 ENCOUNTER — Ambulatory Visit (HOSPITAL_COMMUNITY): Payer: 59

## 2016-10-05 DIAGNOSIS — R262 Difficulty in walking, not elsewhere classified: Secondary | ICD-10-CM

## 2016-10-05 DIAGNOSIS — M6281 Muscle weakness (generalized): Secondary | ICD-10-CM

## 2016-10-05 DIAGNOSIS — M25662 Stiffness of left knee, not elsewhere classified: Secondary | ICD-10-CM

## 2016-10-05 NOTE — Therapy (Signed)
[place], [place], [address] Phone: [phone]   Fax:  [phone]  Physical Therapy Treatment  Patient Details  Name: Yvette Joyce MRN: [id number] Date of Birth: April 02, 1961 Referring Provider: Elsie Saas, MD  Encounter Date: 10/05/2016      PT End of Session - 10/05/16 1006    Visit Number 3   Number of Visits 17   Date for PT Re-Evaluation 10/26/16   Authorization Type [employer]   PT Start Time [id number]   PT Stop Time 1029   PT Time Calculation (min) 41 min   Activity Tolerance Patient tolerated treatment well   Behavior During Therapy [facility] for tasks assessed/performed      Past Medical History:  Diagnosis Date  . Adult attention deficit disorder   . Arthritis   . Complication of anesthesia    "hard to wake up"  . Constipation   . Fatigue 03/07/2015  . Headache(784.0)   . Hematuria 04/12/2013  . Hypothyroidism   . Postmenopause 03/07/2015  . Primary localized osteoarthritis of left knee 08/26/2016  . Sleep disturbance 03/07/2015  . Snoring   . Thyroid disease   . Trigeminal neuralgia pain     Past Surgical History:  Procedure Laterality Date  . ABDOMINAL HYSTERECTOMY    . CARPAL TUNNEL RELEASE Right 07/2016  . COLONOSCOPY  09/24/2011   [id number] polyps in the ascending colon/diverticula in the sigmoid colon/internal hemorrhoids  . cosmetic eye surgery    . fibroids removed from uterus    . HYSTEROSCOPY W/D&C  12/03/2011   Procedure: DILATATION AND CURETTAGE /HYSTEROSCOPY;  Surgeon: Jonnie Kind, MD;  Location: [facility];  Service: Gynecology;  Laterality: N/A;  . KNEE ARTHROSCOPY W/ DEBRIDEMENT    . LAPAROSCOPIC SALPINGO OOPHERECTOMY  07/11/2012   Procedure: LAPAROSCOPIC SALPINGO OOPHORECTOMY;  Surgeon: Jonnie Kind, MD;  Location: [facility];  Service: Gynecology;  Laterality: Bilateral;  . LAPAROSCOPIC SUPRACERVICAL HYSTERECTOMY  07/11/2012   Procedure: LAPAROSCOPIC SUPRACERVICAL  HYSTERECTOMY;  Surgeon: Jonnie Kind, MD;  Location: [facility];  Service: Gynecology;  Laterality: N/A;  . LASIK    . NASAL SEPTUM SURGERY    . POLYPECTOMY  12/03/2011   Procedure: POLYPECTOMY;  Surgeon: Jonnie Kind, MD;  Location: [facility];  Service: Gynecology;  Laterality: N/A;  resection of polyp  . TOTAL KNEE ARTHROPLASTY Left 09/06/2016  . TOTAL KNEE ARTHROPLASTY Left 09/06/2016   Procedure: TOTAL KNEE ARTHROPLASTY;  Surgeon: Elsie Saas, MD;  Location: [facility];  Service: Orthopedics;  Laterality: Left;  Marland Kitchen VEIN SURGERY     Varicose veins    There were no vitals filed for this visit.      Subjective Assessment - 10/05/16 1004    Subjective Pt stated she was limited by pain over weekend, called MD for pain meds.  Current pain scale 7/10.  Reports compliancei wth HEP and has purchased a wedge for comfort.   Patient Stated Goals "be able to be normal, run across the street at work so a car doesn't hit me, and be pain free"   Currently in Pain? Yes   Pain Score 7    Pain Location Knee   Pain Orientation Left   Pain Descriptors / Indicators Dull   Pain Type Surgical pain   Pain Onset 1 to 4 weeks ago   Pain Frequency Intermittent   Aggravating Factors  moving it the wrong way   Pain Relieving Factors resting, ice, pain medication   Effect  of Pain on Daily Activities moderate effect; has some difficulty completeing all ADLs in one sitting                         [facility] Adult PT Treatment/Exercise - 10/05/16 0001      Ambulation/Gait   Ambulation/Gait Yes   Ambulation Distance (Feet) 226 Feet   Assistive device Straight cane   Gait Comments decreased L knee extension t/o gait, mild trendelenberg on L, decreased hip flexion on L and increased hip flexion on R t/o gait; cueing for equalized weight bearing, increasing stance phase and BOS     Knee/Hip Exercises: Standing   Terminal Knee Extension Limitations RTB 10x5"     Knee/Hip Exercises: Seated   Sit to Sand  10 reps;without UE support  Front of mirror cueing for equal weight bearing and descent     Knee/Hip Exercises: Supine   Quad Sets 2 sets;10 reps   Quad Sets Limitations NMR for quad activation   [employer] 15 reps   Heel Slides 10 reps   Heel Slides Limitations 5-135 degrees   Terminal Knee Extension Left;10 reps     Manual Therapy   Manual Therapy Edema management   Manual therapy comments Manual complete separate rest of tx   Edema Management retro massage with LE elevated                  PT Short Term Goals - 09/28/16 1101      PT SHORT TERM GOAL #1   Title Pt will be independent with HEP and tolerate progressions well to demonstrate improved overall function and strength.   Time 3   Period Weeks   Status New     PT SHORT TERM GOAL #2   Title Pt will have improved knee AROM from 0-130 to maximize gait and stair climbing.    Baseline 09/28/16: -5 deg ext, 117 deg flexion   Time 3   Period Weeks   Status New     PT SHORT TERM GOAL #3   Title Pt will be able to have improved TUG time to <20 sec with LRAD to demonstrate improved strength, balance, and overall function.   Baseline 09/28/16: 25.8 sec with SPC   Time 3   Period Weeks   Status New     PT SHORT TERM GOAL #4   Title Pt will have improved 3MWT by >100 ft with LRAD to demonstrate improved endurance, balance, and overall function.   Baseline 09/28/16: 286 ft with SPC   Time 3   Period Weeks   Status New           PT Long Term Goals - 09/28/16 1103      PT LONG TERM GOAL #1   Title Pt will have improved L knee AROM from 0-145 deg to facilitate improved gait pattern and demonstrate improved.   Baseline 09/28/16: -5 deg ext, 117 deg flex   Time 8   Period Weeks   Status New     PT LONG TERM GOAL #2   Title Pt will be able to sleep through the night with <2 awakenings due to pain to demonstrate improved overall function.   Time 8   Period Weeks   Status New     PT LONG TERM GOAL  #3   Title Pt will have improved TUG time to <10 sec without and assistive device to demonstrate improved strength, balance, and overall function.  Baseline 09/28/16: 25.8 sec with SPC   Time 8   Period Weeks   Status New     PT LONG TERM GOAL #4   Title Pt will have improved 3MWT without and assistive device by >300 ft to demonstrate improved endurance, balance, and overall function.   Baseline 09/28/16: 286 ft with SPC   Time 8   Period Weeks   Status New     PT LONG TERM GOAL #5   Title Pt will have improved SLS on BLE to 10 sec or greater to demonstrate improved overall balance and function.   Time 8   Period Weeks   Status New               Plan - 10/05/16 1045    Clinical Impression Statement Pt progressing well with improved gait mechanics with SPC and reports of compliance wiht HEP.  Edema present proximal knee.  Began session with retro massage to assist with edema and pain control prior ROM based exercises.  NMR required to improve quadricep contraction and relax gluteal activation..  Progressed to TKE to improve quadricep contraction for knee extension strengthening.  Added sit to stand with focus on equal stance and controlled descent, utilized mirror and verbal cueing to improve awareness.  ROM progressing well with AROM 5-135degrees at EOS.     Rehab Potential Good   Clinical Impairments Affecting Rehab Potential (+): young, motivated, active lifestyle prior to surgery, familial support   PT Frequency 2x / week   PT Duration 8 weeks   PT Treatment/Interventions ADLs/Self Care Home Management;Cryotherapy;Electrical Stimulation;Gait training;Stair training;Functional mobility training;Therapeutic activities;Therapeutic exercise;Balance training;Neuromuscular re-education;Patient/family education;Manual techniques;Scar mobilization;Passive range of motion;Taping   PT Next Visit Plan Continue manual to address edema control; therex with ROM based therex extension>flexion;  prior strengthening      Patient will benefit from skilled therapeutic intervention in order to improve the following deficits and impairments:  Abnormal gait, Decreased activity tolerance, Decreased balance, Decreased range of motion, Decreased strength, Difficulty walking, Hypomobility, Increased edema, Pain  Visit Diagnosis: Difficulty in walking, not elsewhere classified  Muscle weakness (generalized)  Stiffness of left knee, not elsewhere classified     Problem List Patient Active Problem List   Diagnosis Date Noted  . Primary localized osteoarthritis of left knee 08/26/2016  . Trigeminal neuralgia pain   . Fatigue 03/07/2015  . Sleep disturbance 03/07/2015  . Postmenopause 03/07/2015  . Hemorrhoids 11/13/2014  . Knee pain, acute 05/01/2014  . Left leg weakness 05/01/2014  . Hypothyroid 02/28/2014  . Dilated cbd, acquired 06/22/2013  . Hematuria 04/12/2013   Ihor Austin, [place]; [place]  Aldona Lento 10/05/2016, 10:54 AM  [place] [address] [place], [place], [address] Phone: [phone]   Fax:  [phone]  Name: TAKISHIA GRILLIOT MRN: [id number] Date of Birth: 1960-09-11

## 2016-10-07 ENCOUNTER — Ambulatory Visit (HOSPITAL_COMMUNITY): Payer: 59 | Admitting: Physical Therapy

## 2016-10-07 ENCOUNTER — Encounter (HOSPITAL_COMMUNITY): Payer: Self-pay | Admitting: Physical Therapy

## 2016-10-07 ENCOUNTER — Telehealth (HOSPITAL_COMMUNITY): Payer: Self-pay | Admitting: Physical Therapy

## 2016-10-07 DIAGNOSIS — R262 Difficulty in walking, not elsewhere classified: Secondary | ICD-10-CM

## 2016-10-07 DIAGNOSIS — M25662 Stiffness of left knee, not elsewhere classified: Secondary | ICD-10-CM

## 2016-10-07 DIAGNOSIS — M6281 Muscle weakness (generalized): Secondary | ICD-10-CM

## 2016-10-07 NOTE — Therapy (Signed)
[place], [place], [address] Phone: [phone]   Fax:  [phone]  Physical Therapy Treatment  Patient Details  Name: Yvette Joyce MRN: [id number] Date of Birth: [birth year] Referring Provider: Elsie Saas, MD  Encounter Date: 10/07/2016      PT End of Session - 10/07/16 1206    Visit Number 4   Number of Visits 17   Date for PT Re-Evaluation 10/26/16   Authorization Type [employer]   PT Start Time 0945   PT Stop Time 1030   PT Time Calculation (min) 45 min   Activity Tolerance Patient tolerated treatment well   Behavior During Therapy [facility] for tasks assessed/performed      Past Medical History:  Diagnosis Date  . Adult attention deficit disorder   . Arthritis   . Complication of anesthesia    "hard to wake up"  . Constipation   . Fatigue 03/07/2015  . Headache(784.0)   . Hematuria 04/12/2013  . Hypothyroidism   . Postmenopause 03/07/2015  . Primary localized osteoarthritis of left knee 08/26/2016  . Sleep disturbance 03/07/2015  . Snoring   . Thyroid disease   . Trigeminal neuralgia pain     Past Surgical History:  Procedure Laterality Date  . ABDOMINAL HYSTERECTOMY    . CARPAL TUNNEL RELEASE Right 07/2016  . COLONOSCOPY  09/24/2011   [id number] polyps in the ascending colon/diverticula in the sigmoid colon/internal hemorrhoids  . cosmetic eye surgery    . fibroids removed from uterus    . HYSTEROSCOPY W/D&C  12/03/2011   Procedure: DILATATION AND CURETTAGE /HYSTEROSCOPY;  Surgeon: Jonnie Kind, MD;  Location: [facility];  Service: Gynecology;  Laterality: N/A;  . KNEE ARTHROSCOPY W/ DEBRIDEMENT    . LAPAROSCOPIC SALPINGO OOPHERECTOMY  07/11/2012   Procedure: LAPAROSCOPIC SALPINGO OOPHORECTOMY;  Surgeon: Jonnie Kind, MD;  Location: [facility];  Service: Gynecology;  Laterality: Bilateral;  . LAPAROSCOPIC SUPRACERVICAL HYSTERECTOMY  07/11/2012   Procedure: LAPAROSCOPIC SUPRACERVICAL  HYSTERECTOMY;  Surgeon: Jonnie Kind, MD;  Location: [facility];  Service: Gynecology;  Laterality: N/A;  . LASIK    . NASAL SEPTUM SURGERY    . POLYPECTOMY  12/03/2011   Procedure: POLYPECTOMY;  Surgeon: Jonnie Kind, MD;  Location: [facility];  Service: Gynecology;  Laterality: N/A;  resection of polyp  . TOTAL KNEE ARTHROPLASTY Left 09/06/2016  . TOTAL KNEE ARTHROPLASTY Left 09/06/2016   Procedure: TOTAL KNEE ARTHROPLASTY;  Surgeon: Elsie Saas, MD;  Location: [place] [facility];  Service: Orthopedics;  Laterality: Left;  Marland Kitchen VEIN SURGERY     Varicose veins    There were no vitals filed for this visit.      Subjective Assessment - 10/07/16 0946    Subjective Pt states that she is doing alright today. She states that she just gets so worn out when she gets out of the house and after new exercises.   Patient Stated Goals "be able to be normal, run across the street at work so a car doesn't hit me, and be pain free"   Currently in Pain? Yes   Pain Score 6    Pain Location Knee   Pain Orientation Left   Pain Descriptors / Indicators Dull   Pain Type Surgical pain   Pain Onset 1 to 4 weeks ago   Pain Frequency Intermittent   Aggravating Factors  moving it the wrong way   Pain Relieving Factors resting, ice, pain medication   Effect of Pain on  Daily Activities moderate effect; has some difficulty completeling all ADLs in one sitting           [facility] Adult PT Treatment/Exercise - 10/07/16 0001      Ambulation/Gait   Ambulation/Gait Yes   Ambulation Distance (Feet) 226 Feet   Assistive device Straight cane   Gait Comments decreased L knee extension t/o, increased L hip IR and L trendelenberg t/o      Knee/Hip Exercises: Supine   Quad Sets 2 sets  8 reps x 5 sec hold   Quad Sets Limitations max tactile cues for proper technique and to decrease glute activation   Straight Leg Raises Strengthening;Left     Knee/Hip Exercises: Sidelying   Hip ABduction Left;2 sets;10 reps   Hip ABduction  Limitations mod tactile cues for proper technique     Manual Therapy   Manual Therapy Edema management   Manual therapy comments Manual complete separate rest of tx   Edema Management retro massage to anterior and posterior joint with LLE in extension           PT Education - 10/07/16 1205    Education provided Yes   Education Details proper exercise technique, proper quad engagement during quad sets and SLR, how to properly elevate LLE when resting and icing at home   Person(s) Educated Patient   Methods Explanation;Demonstration;Handout   Comprehension Verbalized understanding;Returned demonstration          PT Short Term Goals - 09/28/16 1101      PT SHORT TERM GOAL #1   Title Pt will be independent with HEP and tolerate progressions well to demonstrate improved overall function and strength.   Time 3   Period Weeks   Status New     PT SHORT TERM GOAL #2   Title Pt will have improved knee AROM from 0-130 to maximize gait and stair climbing.    Baseline 09/28/16: -5 deg ext, 117 deg flexion   Time 3   Period Weeks   Status New     PT SHORT TERM GOAL #3   Title Pt will be able to have improved TUG time to <20 sec with LRAD to demonstrate improved strength, balance, and overall function.   Baseline 09/28/16: 25.8 sec with SPC   Time 3   Period Weeks   Status New     PT SHORT TERM GOAL #4   Title Pt will have improved 3MWT by >100 ft with LRAD to demonstrate improved endurance, balance, and overall function.   Baseline 09/28/16: 286 ft with SPC   Time 3   Period Weeks   Status New           PT Long Term Goals - 09/28/16 1103      PT LONG TERM GOAL #1   Title Pt will have improved L knee AROM from 0-145 deg to facilitate improved gait pattern and demonstrate improved.   Baseline 09/28/16: -5 deg ext, 117 deg flex   Time 8   Period Weeks   Status New     PT LONG TERM GOAL #2   Title Pt will be able to sleep through the night with <2 awakenings due to pain  to demonstrate improved overall function.   Time 8   Period Weeks   Status New     PT LONG TERM GOAL #3   Title Pt will have improved TUG time to <10 sec without and assistive device to demonstrate improved strength, balance, and overall function.  Baseline 09/28/16: 25.8 sec with SPC   Time 8   Period Weeks   Status New     PT LONG TERM GOAL #4   Title Pt will have improved 3MWT without and assistive device by >300 ft to demonstrate improved endurance, balance, and overall function.   Baseline 09/28/16: 286 ft with SPC   Time 8   Period Weeks   Status New     PT LONG TERM GOAL #5   Title Pt will have improved SLS on BLE to 10 sec or greater to demonstrate improved overall balance and function.   Time 8   Period Weeks   Status New               Plan - 10/07/16 1206    Clinical Impression Statement Pt continuing to make progress. She still has increased swelling around L knee which was addressed with manual therapy again this date. Pt continues to require verbal and tactile cues for proper quad contraction during quad sets. She tolerated progressed stengthening well this date. Pt AROM 5-130 this date. During gait analysis, pt had increased L hip IR and L trendelenberg t/o, indicating continued functional weakness of hip musculature. Pt needs continued skilled PT intervention to maximize overall strength and function.   Rehab Potential Good   Clinical Impairments Affecting Rehab Potential (+): young, motivated, active lifestyle prior to surgery, familial support   PT Frequency 2x / week   PT Duration 8 weeks   PT Treatment/Interventions ADLs/Self Care Home Management;Cryotherapy;Electrical Stimulation;Gait training;Stair training;Functional mobility training;Therapeutic activities;Therapeutic exercise;Balance training;Neuromuscular re-education;Patient/family education;Manual techniques;Scar mobilization;Passive range of motion;Taping   PT Next Visit Plan Continue manual to  address edema control; therex with ROM based therex extension>flexion; prior strengthening   PT Home Exercise Plan Eval: prone hangs, knee flexion stretch on step; quad sets; 2/22 - SLR, sidelying abd, seated HS stretch with foot on step    Consulted and Agree with Plan of Care Patient      Patient will benefit from skilled therapeutic intervention in order to improve the following deficits and impairments:  Abnormal gait, Decreased activity tolerance, Decreased balance, Decreased range of motion, Decreased strength, Difficulty walking, Hypomobility, Increased edema, Pain  Visit Diagnosis: Difficulty in walking, not elsewhere classified  Muscle weakness (generalized)  Stiffness of left knee, not elsewhere classified     Problem List Patient Active Problem List   Diagnosis Date Noted  . Primary localized osteoarthritis of left knee 08/26/2016  . Trigeminal neuralgia pain   . Fatigue 03/07/2015  . Sleep disturbance 03/07/2015  . Postmenopause 03/07/2015  . Hemorrhoids 11/13/2014  . Knee pain, acute 05/01/2014  . Left leg weakness 05/01/2014  . Hypothyroid 02/28/2014  . Dilated cbd, acquired 06/22/2013  . Hematuria 04/12/2013     Geraldine Solar PT, DPT   [place] [address] [place], [place], [address] Phone: [phone]   Fax:  [phone]  Name: Yvette Joyce MRN: [id number] Date of Birth: [birth year]

## 2016-10-07 NOTE — Telephone Encounter (Signed)
switch appts dates

## 2016-10-07 NOTE — Patient Instructions (Signed)
  STRAIGHT LEG RAISE - SLR  While lying on your back, raise up your leg with a straight knee.  Keep the opposite knee bent with the foot planted on the ground.  Perform 2-3 sets of 8-10 reps  HIP ABDUCTION - SIDELYING  While lying on your side, slowly raise up your top leg to the side. Keep your knee straight and maintain your toes pointed forward the entire time. Keep your leg slightly behind you -- you should feel this in your hip muscles and make sure that you stay rolled forwards on your hip  The bottom leg can be bent to stabilize your body.  Perform 2-3 sets of 8-10 reps    Seated Hamstring Stretch Side View  Seated in Chair bend one knee and straighten the other leg. Reach down to the straight leg, putting pressure above the knee Place heel of L leg on step and keep toes pointed upwards towards ceiling 3 stretches x 30-60 seconds

## 2016-10-12 ENCOUNTER — Ambulatory Visit (HOSPITAL_COMMUNITY): Payer: 59

## 2016-10-13 ENCOUNTER — Ambulatory Visit (HOSPITAL_COMMUNITY): Payer: 59

## 2016-10-13 DIAGNOSIS — M25662 Stiffness of left knee, not elsewhere classified: Secondary | ICD-10-CM

## 2016-10-13 DIAGNOSIS — M6281 Muscle weakness (generalized): Secondary | ICD-10-CM

## 2016-10-13 DIAGNOSIS — R262 Difficulty in walking, not elsewhere classified: Secondary | ICD-10-CM | POA: Diagnosis not present

## 2016-10-13 NOTE — Therapy (Signed)
[facility] [place], [place], [address] Phone: [phone]   Fax:  [phone]  Physical Therapy Treatment  Patient Details  Name: Yvette Joyce MRN: [id number] Date of Birth: Nov 18, 1960 Referring Provider: Elsie Saas, MD  Encounter Date: 10/13/2016      PT End of Session - 10/13/16 0835    Visit Number 5   Number of Visits 17   Date for PT Re-Evaluation 10/26/16   Authorization Type [employer]   PT Start Time 0818   PT Stop Time [id number]   PT Time Calculation (min) 41 min   Activity Tolerance Patient tolerated treatment well;No increased pain   Behavior During Therapy WFL for tasks assessed/performed      Past Medical History:  Diagnosis Date  . Adult attention deficit disorder   . Arthritis   . Complication of anesthesia    "hard to wake up"  . Constipation   . Fatigue 03/07/2015  . Headache(784.0)   . Hematuria 04/12/2013  . Hypothyroidism   . Postmenopause 03/07/2015  . Primary localized osteoarthritis of left knee 08/26/2016  . Sleep disturbance 03/07/2015  . Snoring   . Thyroid disease   . Trigeminal neuralgia pain     Past Surgical History:  Procedure Laterality Date  . ABDOMINAL HYSTERECTOMY    . CARPAL TUNNEL RELEASE Right 07/2016  . COLONOSCOPY  09/24/2011   [id number] polyps in the ascending colon/diverticula in the sigmoid colon/internal hemorrhoids  . cosmetic eye surgery    . fibroids removed from uterus    . HYSTEROSCOPY W/D&C  12/03/2011   Procedure: DILATATION AND CURETTAGE /HYSTEROSCOPY;  Surgeon: Jonnie Kind, MD;  Location: [facility];  Service: Gynecology;  Laterality: N/A;  . KNEE ARTHROSCOPY W/ DEBRIDEMENT    . LAPAROSCOPIC SALPINGO OOPHERECTOMY  07/11/2012   Procedure: LAPAROSCOPIC SALPINGO OOPHORECTOMY;  Surgeon: Jonnie Kind, MD;  Location: [facility];  Service: Gynecology;  Laterality: Bilateral;  . LAPAROSCOPIC SUPRACERVICAL HYSTERECTOMY  07/11/2012   Procedure: LAPAROSCOPIC  SUPRACERVICAL HYSTERECTOMY;  Surgeon: Jonnie Kind, MD;  Location: [facility];  Service: Gynecology;  Laterality: N/A;  . LASIK    . NASAL SEPTUM SURGERY    . POLYPECTOMY  12/03/2011   Procedure: POLYPECTOMY;  Surgeon: Jonnie Kind, MD;  Location: [facility];  Service: Gynecology;  Laterality: N/A;  resection of polyp  . TOTAL KNEE ARTHROPLASTY Left 09/06/2016  . TOTAL KNEE ARTHROPLASTY Left 09/06/2016   Procedure: TOTAL KNEE ARTHROPLASTY;  Surgeon: Elsie Saas, MD;  Location: [place];  Service: Orthopedics;  Laterality: Left;  Marland Kitchen VEIN SURGERY     Varicose veins    There were no vitals filed for this visit.      Subjective Assessment - 10/13/16 0833    Subjective Pt stated current pain scale 5/10 mainly stiffness.  Reports compliance with HEP daily.   Patient Stated Goals "be able to be normal, run across the street at work so a car doesn't hit me, and be pain free"   Currently in Pain? Yes   Pain Score 5    Pain Location Knee   Pain Orientation Left   Pain Descriptors / Indicators Tightness  Stiffness   Pain Type Surgical pain   Pain Onset 1 to 4 weeks ago   Pain Frequency Intermittent   Aggravating Factors  moving it the wrong way   Pain Relieving Factors resting, ice, pain medication   Effect of Pain on Daily Activities moderate effect; has some difficulty completeing all ADLs  ion one sitting                         [facility] Adult PT Treatment/Exercise - 10/13/16 0001      Knee/Hip Exercises: Stretches   Active Hamstring Stretch 3 reps;30 seconds   Active Hamstring Stretch Limitations supine with rope     Knee/Hip Exercises: Standing   Terminal Knee Extension Limitations RTB 10x5"     Knee/Hip Exercises: Supine   Quad Sets 15 reps   Quad Sets Limitations max tactile and verbal cueing to improve activation   Short Arc Quad Sets 15 reps   Heel Slides 10 reps   Heel Slides Limitations 5-139   Straight Leg Raises Left;15 reps   Straight Leg Raises Limitations  quad set prior SLR     Knee/Hip Exercises: Prone   Straight Leg Raises Limitations TKR 10x5"     Manual Therapy   Manual Therapy Edema management;Joint mobilization   Manual therapy comments Manual complete separate rest of tx   Edema Management retro massage to anterior and posterior joint with LLE in extension   Joint Mobilization patella mobs all directions and tib/fib                  PT Short Term Goals - 09/28/16 1101      PT SHORT TERM GOAL #1   Title Pt will be independent with HEP and tolerate progressions well to demonstrate improved overall function and strength.   Time 3   Period Weeks   Status New     PT SHORT TERM GOAL #2   Title Pt will have improved knee AROM from 0-130 to maximize gait and stair climbing.    Baseline 09/28/16: -5 deg ext, 117 deg flexion   Time 3   Period Weeks   Status New     PT SHORT TERM GOAL #3   Title Pt will be able to have improved TUG time to <20 sec with LRAD to demonstrate improved strength, balance, and overall function.   Baseline 09/28/16: 25.8 sec with SPC   Time 3   Period Weeks   Status New     PT SHORT TERM GOAL #4   Title Pt will have improved 3MWT by >100 ft with LRAD to demonstrate improved endurance, balance, and overall function.   Baseline 09/28/16: 286 ft with SPC   Time 3   Period Weeks   Status New           PT Long Term Goals - 09/28/16 1103      PT LONG TERM GOAL #1   Title Pt will have improved L knee AROM from 0-145 deg to facilitate improved gait pattern and demonstrate improved.   Baseline 09/28/16: -5 deg ext, 117 deg flex   Time 8   Period Weeks   Status New     PT LONG TERM GOAL #2   Title Pt will be able to sleep through the night with <2 awakenings due to pain to demonstrate improved overall function.   Time 8   Period Weeks   Status New     PT LONG TERM GOAL #3   Title Pt will have improved TUG time to <10 sec without and assistive device to demonstrate improved strength,  balance, and overall function.   Baseline 09/28/16: 25.8 sec with SPC   Time 8   Period Weeks   Status New     PT LONG TERM GOAL #4  Title Pt will have improved 3MWT without and assistive device by >300 ft to demonstrate improved endurance, balance, and overall function.   Baseline 09/28/16: 286 ft with SPC   Time 8   Period Weeks   Status New     PT LONG TERM GOAL #5   Title Pt will have improved SLS on BLE to 10 sec or greater to demonstrate improved overall balance and function.   Time 8   Period Weeks   Status New               Plan - 10/13/16 [id number]    Clinical Impression Statement Pt progressing well with reports of compliance with HEP and reports techniques to assist with edema and pain control.  Session focus on knee mobility primarly extension as flexion is at 139 degree flexion.  Pt continues to have difficulty with quadricep contraction, improved form noted with long leg sitting vs supine due to gluteal activaiton.  Noted increased edema present proximal knee, manual retro massage and scar tissue mobilizaiton technqiues complete to assist with extension.  Added prone and standing TKE to improve terminal knee extension.  No reports of increased pain through session.     Rehab Potential Good   Clinical Impairments Affecting Rehab Potential (+): young, motivated, active lifestyle prior to surgery, familial support   PT Duration 8 weeks   PT Next Visit Plan Continue manual to address edema control; therex with ROM based therex extension>flexion; prior strengthening      Patient will benefit from skilled therapeutic intervention in order to improve the following deficits and impairments:  Abnormal gait, Decreased activity tolerance, Decreased balance, Decreased range of motion, Decreased strength, Difficulty walking, Hypomobility, Increased edema, Pain  Visit Diagnosis: Difficulty in walking, not elsewhere classified  Muscle weakness (generalized)  Stiffness of left  knee, not elsewhere classified     Problem List Patient Active Problem List   Diagnosis Date Noted  . Primary localized osteoarthritis of left knee 08/26/2016  . Trigeminal neuralgia pain   . Fatigue 03/07/2015  . Sleep disturbance 03/07/2015  . Postmenopause 03/07/2015  . Hemorrhoids 11/13/2014  . Knee pain, acute 05/01/2014  . Left leg weakness 05/01/2014  . Hypothyroid 02/28/2014  . Dilated cbd, acquired 06/22/2013  . Hematuria 04/12/2013   Ihor Austin, LPTA; [place]  Aldona Lento 10/13/2016, 1:19 PM  [facility], [place], [address] Phone: [phone]   Fax:  [phone]  Name: Yvette Joyce MRN: [id number] Date of Birth: 01/30/1961

## 2016-10-14 ENCOUNTER — Encounter (HOSPITAL_COMMUNITY): Payer: 59 | Admitting: Physical Therapy

## 2016-10-14 ENCOUNTER — Ambulatory Visit: Payer: 59

## 2016-10-15 ENCOUNTER — Ambulatory Visit
Admission: RE | Admit: 2016-10-15 | Discharge: 2016-10-15 | Disposition: A | Discharge status: Home / Self Care | Payer: 59 | Source: Ambulatory Visit | Attending: Internal Medicine | Admitting: Internal Medicine

## 2016-10-15 ENCOUNTER — Ambulatory Visit (HOSPITAL_COMMUNITY): Payer: 59 | Attending: Orthopedic Surgery

## 2016-10-15 DIAGNOSIS — Z1231 Encounter for screening mammogram for malignant neoplasm of breast: Secondary | ICD-10-CM

## 2016-10-15 DIAGNOSIS — R262 Difficulty in walking, not elsewhere classified: Secondary | ICD-10-CM | POA: Diagnosis present

## 2016-10-15 DIAGNOSIS — M6281 Muscle weakness (generalized): Secondary | ICD-10-CM | POA: Diagnosis present

## 2016-10-15 DIAGNOSIS — M25662 Stiffness of left knee, not elsewhere classified: Secondary | ICD-10-CM | POA: Insufficient documentation

## 2016-10-15 NOTE — Therapy (Signed)
[place], [place], [address] Phone: [phone]   Fax:  [phone]  Physical Therapy Treatment  Patient Details  Name: Yvette Joyce MRN: [id number] Date of Birth: 1960-11-02 Referring Provider: Elsie Saas, MD  Encounter Date: 10/15/2016      PT End of Session - 10/15/16 0840    Visit Number 6   Number of Visits 17   Date for PT Re-Evaluation 10/26/16   Authorization Type [employer]   PT Start Time [phone]   PT Stop Time 0902   PT Time Calculation (min) 46 min   Activity Tolerance Patient tolerated treatment well;No increased pain   Behavior During Therapy WFL for tasks assessed/performed      Past Medical History:  Diagnosis Date  . Adult attention deficit disorder   . Arthritis   . Complication of anesthesia    "hard to wake up"  . Constipation   . Fatigue 03/07/2015  . Headache(784.0)   . Hematuria 04/12/2013  . Hypothyroidism   . Postmenopause 03/07/2015  . Primary localized osteoarthritis of left knee 08/26/2016  . Sleep disturbance 03/07/2015  . Snoring   . Thyroid disease   . Trigeminal neuralgia pain     Past Surgical History:  Procedure Laterality Date  . ABDOMINAL HYSTERECTOMY    . BREAST CYST ASPIRATION  2015  . CARPAL TUNNEL RELEASE Right 07/2016  . COLONOSCOPY  09/24/2011   [id number] polyps in the ascending colon/diverticula in the sigmoid colon/internal hemorrhoids  . cosmetic eye surgery    . fibroids removed from uterus    . HYSTEROSCOPY W/D&C  12/03/2011   Procedure: DILATATION AND CURETTAGE /HYSTEROSCOPY;  Surgeon: Jonnie Kind, MD;  Location: [facility];  Service: Gynecology;  Laterality: N/A;  . KNEE ARTHROSCOPY W/ DEBRIDEMENT    . LAPAROSCOPIC SALPINGO OOPHERECTOMY  07/11/2012   Procedure: LAPAROSCOPIC SALPINGO OOPHORECTOMY;  Surgeon: Jonnie Kind, MD;  Location: [facility];  Service: Gynecology;  Laterality: Bilateral;  . LAPAROSCOPIC SUPRACERVICAL HYSTERECTOMY   07/11/2012   Procedure: LAPAROSCOPIC SUPRACERVICAL HYSTERECTOMY;  Surgeon: Jonnie Kind, MD;  Location: [facility];  Service: Gynecology;  Laterality: N/A;  . LASIK    . NASAL SEPTUM SURGERY    . POLYPECTOMY  12/03/2011   Procedure: POLYPECTOMY;  Surgeon: Jonnie Kind, MD;  Location: [facility];  Service: Gynecology;  Laterality: N/A;  resection of polyp  . TOTAL KNEE ARTHROPLASTY Left 09/06/2016  . TOTAL KNEE ARTHROPLASTY Left 09/06/2016   Procedure: TOTAL KNEE ARTHROPLASTY;  Surgeon: Elsie Saas, MD;  Location: [facility];  Service: Orthopedics;  Laterality: Left;  Marland Kitchen VEIN SURGERY     Varicose veins    There were no vitals filed for this visit.      Subjective Assessment - 10/15/16 0820    Subjective Pt stated this morning and the current weather are tough, increased pain today pain scale 7-8/10.  Reportscompliance with HEP daily   Patient Stated Goals "be able to be normal, run across the street at work so a car doesn't hit me, and be pain free"   Currently in Pain? Yes   Pain Score 7    Pain Location Knee   Pain Orientation Left   Pain Type Surgical pain   Pain Onset 1 to 4 weeks ago   Pain Frequency Intermittent   Aggravating Factors  moving it the wrong way, weather   Pain Relieving Factors resting, ice, pain medication   Effect of Pain on Daily Activities moderate effect;  has some difficulty completeing all ADLs in one sitting            [facility] PT Assessment - 10/15/16 0001      Assessment   Medical Diagnosis L TKR   Referring Provider Elsie Saas, MD   Onset Date/Surgical Date 09/06/16   Next MD Visit 10/21/16   Prior Therapy here for L knee after arthorscopic in 2015                     [facility] [place] Adult PT Treatment/Exercise - 10/15/16 0001      Knee/Hip Exercises: Standing   Terminal Knee Extension Limitations RTB 10x5"   Rocker Board 2 minutes   Rocker Board Limitations R/L   Gait Training 226 ft with focus on TKE with heel strike for range and to reduce  antalgic gait mechanics     Knee/Hip Exercises: Seated   Sit to Sand 10 reps;without UE support  front of mirror for equal weight distribution     Knee/Hip Exercises: Supine   Quad Sets 15 reps;2 sets   Quad Sets Limitations tactile and verbal cueing for quad set activation; smaller bolster 2nd set   Short Arc [employer] 15 reps   Short Arc Quad Sets Limitations 5" holds   Terminal Knee Extension Left;10 reps   Straight Leg Raises Left;15 reps   Straight Leg Raises Limitations quad set prior SLR     Knee/Hip Exercises: Prone   Straight Leg Raises Limitations TKE 15x5"     Manual Therapy   Manual Therapy Edema management;Joint mobilization   Manual therapy comments Manual complete separate rest of tx   Edema Management retro massage to anterior and posterior joint with LLE in extension   Joint Mobilization patella mobs all directions and tib/fib                  PT Short Term Goals - 09/28/16 1101      PT SHORT TERM GOAL #1   Title Pt will be independent with HEP and tolerate progressions well to demonstrate improved overall function and strength.   Time 3   Period Weeks   Status New     PT SHORT TERM GOAL #2   Title Pt will have improved knee AROM from 0-130 to maximize gait and stair climbing.    Baseline 09/28/16: -5 deg ext, 117 deg flexion   Time 3   Period Weeks   Status New     PT SHORT TERM GOAL #3   Title Pt will be able to have improved TUG time to <20 sec with LRAD to demonstrate improved strength, balance, and overall function.   Baseline 09/28/16: 25.8 sec with SPC   Time 3   Period Weeks   Status New     PT SHORT TERM GOAL #4   Title Pt will have improved 3MWT by >100 ft with LRAD to demonstrate improved endurance, balance, and overall function.   Baseline 09/28/16: 286 ft with SPC   Time 3   Period Weeks   Status New           PT Long Term Goals - 09/28/16 1103      PT LONG TERM GOAL #1   Title Pt will have improved L knee AROM from  0-145 deg to facilitate improved gait pattern and demonstrate improved.   Baseline 09/28/16: -5 deg ext, 117 deg flex   Time 8   Period Weeks   Status New  PT LONG TERM GOAL #2   Title Pt will be able to sleep through the night with <2 awakenings due to pain to demonstrate improved overall function.   Time 8   Period Weeks   Status New     PT LONG TERM GOAL #3   Title Pt will have improved TUG time to <10 sec without and assistive device to demonstrate improved strength, balance, and overall function.   Baseline 09/28/16: 25.8 sec with SPC   Time 8   Period Weeks   Status New     PT LONG TERM GOAL #4   Title Pt will have improved 3MWT without and assistive device by >300 ft to demonstrate improved endurance, balance, and overall function.   Baseline 09/28/16: 286 ft with SPC   Time 8   Period Weeks   Status New     PT LONG TERM GOAL #5   Title Pt will have improved SLS on BLE to 10 sec or greater to demonstrate improved overall balance and function.   Time 8   Period Weeks   Status New               Plan - 10/15/16 0840    Clinical Impression Statement Session focus on improving knee extension.  Pt continues to difficulty with quad contraction as tends to activate gluteal musculature, improved with multiple sets verbal and tactile cueing with ability to complete TKE with proper activation.  Added rocker board and continued with STS with cueing to symmetrical weight bearing to reduce antalgic mechanics.  No reports of increased pain at EOS.     Rehab Potential Good   Clinical Impairments Affecting Rehab Potential (+): young, motivated, active lifestyle prior to surgery, familial support   PT Frequency 2x / week   PT Duration 8 weeks   PT Treatment/Interventions ADLs/Self Care Home Management;Cryotherapy;Electrical Stimulation;Gait training;Stair training;Functional mobility training;Therapeutic activities;Therapeutic exercise;Balance training;Neuromuscular  re-education;Patient/family education;Manual techniques;Scar mobilization;Passive range of motion;Taping   PT Next Visit Plan Continue manual to address edema control; therex with ROM based therex extension>flexion; prior strengthening   PT Home Exercise Plan Eval: prone hangs, knee flexion stretch on step; quad sets; 2/22 - SLR, sidelying abd, seated HS stretch with foot on step       Patient will benefit from skilled therapeutic intervention in order to improve the following deficits and impairments:  Abnormal gait, Decreased activity tolerance, Decreased balance, Decreased range of motion, Decreased strength, Difficulty walking, Hypomobility, Increased edema, Pain  Visit Diagnosis: Difficulty in walking, not elsewhere classified  Muscle weakness (generalized)  Stiffness of left knee, not elsewhere classified     Problem List Patient Active Problem List   Diagnosis Date Noted  . Primary localized osteoarthritis of left knee 08/26/2016  . Trigeminal neuralgia pain   . Fatigue 03/07/2015  . Sleep disturbance 03/07/2015  . Postmenopause 03/07/2015  . Hemorrhoids 11/13/2014  . Knee pain, acute 05/01/2014  . Left leg weakness 05/01/2014  . Hypothyroid 02/28/2014  . Dilated cbd, acquired 06/22/2013  . Hematuria 04/12/2013   Ihor Austin, [place]; [place]  Aldona Lento 10/15/2016, 4:51 PM  [facility] [address] [place], [place], [address] Phone: [phone]   Fax:  [phone]  Name: Yvette Joyce MRN: [id number] Date of Birth: 1960/09/03

## 2016-10-19 ENCOUNTER — Ambulatory Visit (HOSPITAL_COMMUNITY): Payer: 59

## 2016-10-19 DIAGNOSIS — R262 Difficulty in walking, not elsewhere classified: Secondary | ICD-10-CM | POA: Diagnosis not present

## 2016-10-19 DIAGNOSIS — M6281 Muscle weakness (generalized): Secondary | ICD-10-CM

## 2016-10-19 DIAGNOSIS — M25662 Stiffness of left knee, not elsewhere classified: Secondary | ICD-10-CM

## 2016-10-19 NOTE — Therapy (Addendum)
[place], [place], [address] Phone: [phone]   Fax:  [phone]  Physical Therapy Treatment  Patient Details  Name: Yvette Joyce MRN: [id number] Date of Birth: Apr 28, 1961 Referring Provider: Elsie Saas, MD  Encounter Date: 10/19/2016      PT End of Session - 10/19/16 1033    Visit Number 7   Number of Visits 17   Date for PT Re-Evaluation 10/26/16   Authorization Type [employer]   PT Start Time [phone]   PT Stop Time 1031   PT Time Calculation (min) 45 min   Activity Tolerance Patient tolerated treatment well;No increased pain   Behavior During Therapy WFL for tasks assessed/performed      Past Medical History:  Diagnosis Date  . Adult attention deficit disorder   . Arthritis   . Complication of anesthesia    "hard to wake up"  . Constipation   . Fatigue 03/07/2015  . Headache(784.0)   . Hematuria 04/12/2013  . Hypothyroidism   . Postmenopause 03/07/2015  . Primary localized osteoarthritis of left knee 08/26/2016  . Sleep disturbance 03/07/2015  . Snoring   . Thyroid disease   . Trigeminal neuralgia pain     Past Surgical History:  Procedure Laterality Date  . ABDOMINAL HYSTERECTOMY    . BREAST CYST ASPIRATION  2015  . CARPAL TUNNEL RELEASE Right 07/2016  . COLONOSCOPY  09/24/2011   WLS:LHTDSKA polyps in the ascending colon/diverticula in the sigmoid colon/internal hemorrhoids  . cosmetic eye surgery    . fibroids removed from uterus    . HYSTEROSCOPY W/D&C  12/03/2011   Procedure: DILATATION AND CURETTAGE /HYSTEROSCOPY;  Surgeon: Jonnie Kind, MD;  Location: [facility];  Service: Gynecology;  Laterality: N/A;  . KNEE ARTHROSCOPY W/ DEBRIDEMENT    . LAPAROSCOPIC SALPINGO OOPHERECTOMY  07/11/2012   Procedure: LAPAROSCOPIC SALPINGO OOPHORECTOMY;  Surgeon: Jonnie Kind, MD;  Location: [facility];  Service: Gynecology;  Laterality: Bilateral;  . LAPAROSCOPIC SUPRACERVICAL HYSTERECTOMY   07/11/2012   Procedure: LAPAROSCOPIC SUPRACERVICAL HYSTERECTOMY;  Surgeon: Jonnie Kind, MD;  Location: [facility];  Service: Gynecology;  Laterality: N/A;  . LASIK    . NASAL SEPTUM SURGERY    . POLYPECTOMY  12/03/2011   Procedure: POLYPECTOMY;  Surgeon: Jonnie Kind, MD;  Location: [facility];  Service: Gynecology;  Laterality: N/A;  resection of polyp  . TOTAL KNEE ARTHROPLASTY Left 09/06/2016  . TOTAL KNEE ARTHROPLASTY Left 09/06/2016   Procedure: TOTAL KNEE ARTHROPLASTY;  Surgeon: Elsie Saas, MD;  Location: [facility];  Service: Orthopedics;  Laterality: Left;  Marland Kitchen VEIN SURGERY     Varicose veins    There were no vitals filed for this visit.          [facility] PT Assessment - 10/19/16 0001      Assessment   Medical Diagnosis L TKR   Referring Provider Elsie Saas, MD   Onset Date/Surgical Date 09/06/16   Next MD Visit 10/21/16   Prior Therapy here for L knee after arthorscopic in 2015     AROM   Right Knee Extension 0   Right Knee Flexion 145   Left Knee Extension 5   Left Knee Flexion 136     Ambulation/Gait   Ambulation/Gait Yes   Ambulation Distance (Feet) 634 Feet  3MWT with intermittent SPC to improve mechanics   Gait Comments decreased L knee extension t/o, increased L hip IR and L trendelenberg t/o  Timed Up and Go Test   TUG Normal TUG   Normal TUG (seconds) --  8.0" no HHA was 25.8                     [facility] Adult PT Treatment/Exercise - 10/19/16 0001      Knee/Hip Exercises: Stretches   Active Hamstring Stretch 3 reps;30 seconds   Active Hamstring Stretch Limitations supine with rope     Knee/Hip Exercises: Standing   Rocker Board 2 minutes   Rocker Board Limitations R/L     Knee/Hip Exercises: Supine   Quad Sets 15 reps;2 sets   [employer] Limitations tactile and verbal cueing to improve quad activation   Short Arc [employer] 15 reps;2 sets   [employer] Sets Limitations 5' holds   Heel Slides 10 reps   Heel Slides Limitations 5-135    Straight Leg Raises Left;15 reps   Straight Leg Raises Limitations quad set prior SLR     Manual Therapy   Manual Therapy Edema management;Soft tissue mobilization;Joint mobilization   Manual therapy comments Manual complete separate rest of tx   Edema Management retro massage to anterior and posterior joint with LLE in extension   Joint Mobilization patella mobs all directions and tib/fib   Soft tissue mobilization quadriceps to reduce spasm                  PT Short Term Goals - 10/19/16 1011      PT SHORT TERM GOAL #1   Title Pt will be independent with HEP and tolerate progressions well to demonstrate improved overall function and strength.   Baseline 03/06:  Reports complaince iwht HEP daiy   Time 3   Period Weeks   Status Achieved     PT SHORT TERM GOAL #2   Title Pt will have improved knee AROM from 0-130 to maximize gait and stair climbing.    Baseline 03/06: 5 degrees lacking extension- 136 degree flexion   Status Partially Met     PT SHORT TERM GOAL #3   Title Pt will be able to have improved TUG time to <20 sec with LRAD to demonstrate improved strength, balance, and overall function.   Baseline 03/06:  TUG 8" no HHA without SPC     PT SHORT TERM GOAL #4   Title Pt will have improved 3MWT by >100 ft with LRAD to demonstrate improved endurance, balance, and overall function.   Baseline 03/06 3MWT with intermittent SPC A in 634 feet, min cueing to improve stance phase and heel to toe pattern   Status Achieved           PT Long Term Goals - 10/19/16 1013      PT LONG TERM GOAL #1   Title Pt will have improved L knee AROM from 0-145 deg to facilitate improved gait pattern and demonstrate improved.   Baseline 10/19/2016: lacking 5-136   Status On-going     PT LONG TERM GOAL #2   Title Pt will be able to sleep through the night with <2 awakenings due to pain to demonstrate improved overall function.   Baseline 03/06:  Reports sleep is still difficulty.  feels she slep 2-3 hours total due to knee pain   Status On-going     PT LONG TERM GOAL #3   Title Pt will have improved TUG time to <10 sec without and assistive device to demonstrate improved strength, balance, and overall function.   Baseline  03/06: 8" with no AD   Status Achieved     PT LONG TERM GOAL #4   Title Pt will have improved 3MWT without and assistive device by >300 ft to demonstrate improved endurance, balance, and overall function.   Baseline 10/19/2016: 634 feet with intermittent SPC, min cueing to improve gait mechanics   Status On-going     PT LONG TERM GOAL #5   Title Pt will have improved SLS on BLE to 10 sec or greater to demonstrate improved overall balance and function.   Baseline 03/06:  Rt 47", Lt 54"    Status Achieved               Plan - 10/19/16 1243    Clinical Impression Statement Reviewed goals prior MD apt.  Pt progressing well with improved gait mechanics and reports of complaince iwth HEP.  AROM is progressing 5 degrees lacking extensin and 136 degrees flexion.  Pt continues to have difficulty properly activating quadriceps wihtout gluteal activation. and edema continues to be present proximal knee.  Pt making great functional ability gains noted by improved speed with TUG and 3MWT with minimal HHA required.  Pt continues to have difficulty with sleeping at night due to knee pain.  Pt with improved gait mechanics and reports of pain reduced following manual to assist with edema and tight quadriceps.     Clinical Impairments Affecting Rehab Potential (+): young, motivated, active lifestyle prior to surgery, familial support   PT Frequency 2x / week   PT Duration 8 weeks   PT Treatment/Interventions ADLs/Self Care Home Management;Cryotherapy;Electrical Stimulation;Gait training;Stair training;Functional mobility training;Therapeutic activities;Therapeutic exercise;Balance training;Neuromuscular re-education;Patient/family education;Manual techniques;Scar  mobilization;Passive range of motion;Taping   PT Next Visit Plan Continue manual to address edema control; therex with ROM based therex extension>flexion; prior strengthening   PT Home Exercise Plan Eval: prone hangs, knee flexion stretch on step; quad sets; 2/22 - SLR, sidelying abd, seated HS stretch with foot on step       Patient will benefit from skilled therapeutic intervention in order to improve the following deficits and impairments:  Abnormal gait, Decreased activity tolerance, Decreased balance, Decreased range of motion, Decreased strength, Difficulty walking, Hypomobility, Increased edema, Pain  Visit Diagnosis: Difficulty in walking, not elsewhere classified  Muscle weakness (generalized)  Stiffness of left knee, not elsewhere classified     Problem List Patient Active Problem List   Diagnosis Date Noted  . Primary localized osteoarthritis of left knee 08/26/2016  . Trigeminal neuralgia pain   . Fatigue 03/07/2015  . Sleep disturbance 03/07/2015  . Postmenopause 03/07/2015  . Hemorrhoids 11/13/2014  . Knee pain, acute 05/01/2014  . Left leg weakness 05/01/2014  . Hypothyroid 02/28/2014  . Dilated cbd, acquired 06/22/2013  . Hematuria 04/12/2013    PT aware of PTA assessing pt's goals this session. Geraldine Solar PT, DPT    Ihor Austin, [place]; [facility] [phone]  Aldona Lento 10/19/2016, 12:49 PM  Trainer [address] [place], [place], [address] Phone: [phone]   Fax:  [phone]  Name: Yvette Joyce MRN: [id number] Date of Birth: [birth year]

## 2016-10-21 ENCOUNTER — Telehealth (HOSPITAL_COMMUNITY): Payer: Self-pay

## 2016-10-21 ENCOUNTER — Encounter (HOSPITAL_COMMUNITY): Payer: 59

## 2016-10-21 NOTE — Telephone Encounter (Signed)
Pt wants to be D/c. Pt called states that Dr. Noemi Chapel wants her to come to their PT office so he can keep a better eye on her.

## 2016-10-22 ENCOUNTER — Ambulatory Visit (HOSPITAL_COMMUNITY): Payer: 59

## 2016-10-22 DIAGNOSIS — M25562 Pain in left knee: Secondary | ICD-10-CM | POA: Diagnosis not present

## 2016-10-22 DIAGNOSIS — M25662 Stiffness of left knee, not elsewhere classified: Secondary | ICD-10-CM | POA: Diagnosis not present

## 2016-10-22 DIAGNOSIS — M6281 Muscle weakness (generalized): Secondary | ICD-10-CM | POA: Diagnosis not present

## 2016-10-26 ENCOUNTER — Ambulatory Visit (HOSPITAL_COMMUNITY): Payer: 59

## 2016-10-27 DIAGNOSIS — M25562 Pain in left knee: Secondary | ICD-10-CM | POA: Diagnosis not present

## 2016-10-27 DIAGNOSIS — M6281 Muscle weakness (generalized): Secondary | ICD-10-CM | POA: Diagnosis not present

## 2016-10-27 DIAGNOSIS — M25662 Stiffness of left knee, not elsewhere classified: Secondary | ICD-10-CM | POA: Diagnosis not present

## 2016-10-28 ENCOUNTER — Encounter (HOSPITAL_COMMUNITY): Payer: 59 | Admitting: Physical Therapy

## 2016-10-29 ENCOUNTER — Other Ambulatory Visit: Payer: Self-pay | Admitting: Adult Health

## 2016-10-29 DIAGNOSIS — M25462 Effusion, left knee: Secondary | ICD-10-CM | POA: Diagnosis not present

## 2016-10-29 DIAGNOSIS — M25562 Pain in left knee: Secondary | ICD-10-CM | POA: Diagnosis not present

## 2016-10-29 DIAGNOSIS — M25662 Stiffness of left knee, not elsewhere classified: Secondary | ICD-10-CM | POA: Diagnosis not present

## 2016-11-02 ENCOUNTER — Encounter (HOSPITAL_COMMUNITY): Payer: 59

## 2016-11-03 DIAGNOSIS — M6281 Muscle weakness (generalized): Secondary | ICD-10-CM | POA: Diagnosis not present

## 2016-11-03 DIAGNOSIS — M25562 Pain in left knee: Secondary | ICD-10-CM | POA: Diagnosis not present

## 2016-11-03 DIAGNOSIS — M25662 Stiffness of left knee, not elsewhere classified: Secondary | ICD-10-CM | POA: Diagnosis not present

## 2016-11-04 ENCOUNTER — Encounter (HOSPITAL_COMMUNITY): Payer: 59 | Admitting: Physical Therapy

## 2016-11-05 DIAGNOSIS — M25562 Pain in left knee: Secondary | ICD-10-CM | POA: Diagnosis not present

## 2016-11-05 DIAGNOSIS — M6281 Muscle weakness (generalized): Secondary | ICD-10-CM | POA: Diagnosis not present

## 2016-11-05 DIAGNOSIS — R2689 Other abnormalities of gait and mobility: Secondary | ICD-10-CM | POA: Diagnosis not present

## 2016-11-09 DIAGNOSIS — M6281 Muscle weakness (generalized): Secondary | ICD-10-CM | POA: Diagnosis not present

## 2016-11-09 DIAGNOSIS — M25662 Stiffness of left knee, not elsewhere classified: Secondary | ICD-10-CM | POA: Diagnosis not present

## 2016-11-09 DIAGNOSIS — M25562 Pain in left knee: Secondary | ICD-10-CM | POA: Diagnosis not present

## 2016-11-17 DIAGNOSIS — M25562 Pain in left knee: Secondary | ICD-10-CM | POA: Diagnosis not present

## 2016-11-17 DIAGNOSIS — M25662 Stiffness of left knee, not elsewhere classified: Secondary | ICD-10-CM | POA: Diagnosis not present

## 2016-11-17 DIAGNOSIS — M6281 Muscle weakness (generalized): Secondary | ICD-10-CM | POA: Diagnosis not present

## 2016-11-19 DIAGNOSIS — M25662 Stiffness of left knee, not elsewhere classified: Secondary | ICD-10-CM | POA: Diagnosis not present

## 2016-11-19 DIAGNOSIS — M6281 Muscle weakness (generalized): Secondary | ICD-10-CM | POA: Diagnosis not present

## 2016-11-19 DIAGNOSIS — M25562 Pain in left knee: Secondary | ICD-10-CM | POA: Diagnosis not present

## 2016-11-22 DIAGNOSIS — M6740 Ganglion, unspecified site: Secondary | ICD-10-CM | POA: Diagnosis not present

## 2016-11-22 DIAGNOSIS — M654 Radial styloid tenosynovitis [de Quervain]: Secondary | ICD-10-CM | POA: Diagnosis not present

## 2016-11-22 DIAGNOSIS — G5603 Carpal tunnel syndrome, bilateral upper limbs: Secondary | ICD-10-CM | POA: Diagnosis not present

## 2016-12-04 DIAGNOSIS — J309 Allergic rhinitis, unspecified: Secondary | ICD-10-CM | POA: Diagnosis not present

## 2016-12-04 DIAGNOSIS — J0100 Acute maxillary sinusitis, unspecified: Secondary | ICD-10-CM | POA: Diagnosis not present

## 2016-12-04 DIAGNOSIS — Z6824 Body mass index (BMI) 24.0-24.9, adult: Secondary | ICD-10-CM | POA: Diagnosis not present

## 2016-12-09 DIAGNOSIS — I83813 Varicose veins of bilateral lower extremities with pain: Secondary | ICD-10-CM | POA: Diagnosis not present

## 2016-12-14 HISTORY — PX: TRIGGER FINGER RELEASE: SHX641

## 2016-12-17 DIAGNOSIS — M65311 Trigger thumb, right thumb: Secondary | ICD-10-CM | POA: Diagnosis not present

## 2017-01-05 ENCOUNTER — Ambulatory Visit (INDEPENDENT_AMBULATORY_CARE_PROVIDER_SITE_OTHER): Payer: 59 | Admitting: Adult Health

## 2017-01-05 ENCOUNTER — Encounter: Payer: Self-pay | Admitting: Adult Health

## 2017-01-05 VITALS — BP 119/75 | HR 74 | Ht 65.0 in | Wt 144.0 lb

## 2017-01-05 DIAGNOSIS — G4710 Hypersomnia, unspecified: Secondary | ICD-10-CM

## 2017-01-05 DIAGNOSIS — G500 Trigeminal neuralgia: Secondary | ICD-10-CM | POA: Diagnosis not present

## 2017-01-05 MED ORDER — CARBAMAZEPINE 100 MG PO CHEW: 100.0000 mg | CHEWABLE_TABLET | Freq: Two times a day (BID) | ORAL | 5 refills | Status: DC

## 2017-01-05 NOTE — Progress Notes (Addendum)
PATIENT: Yvette Joyce DOB: 1961/05/26  REASON FOR VISIT: follow up- hypersomnolence, trigeminal neuralgia HISTORY FROM: patient  HISTORY OF PRESENT ILLNESS:  Today 01/05/17:  Yvette Joyce is a 56 year old female with a history of hypersomnolence and trigeminal neuralgia on the left. She returns today for follow-up. She states that she has not been taking her carbamazepine nor armodafinil. She states that she stopped carbamazepine and was able to be off the medication for 3 or 4 months without much discomfort however recently she is beginning to have pain in the left cheek and into the teeth. She states that she's been out of work since December. She's had 2 hand surgeries and knee replacement. She will be returning to work in July. For that reason she has not been taken armodafinil. She will be taking a first shift position when she returns and is hoping that she will not require this medication. She denies any new neurological symptoms. She returns today for an evaluation.   HISTORY: Copied from Dr. Guadelupe Sabin notes: Yvette Joyce is a 56 year old right-handed woman with an underlying medical history of allergic rhinitis, hypothyroidism, ADHD and idiopathic hypersomnolence who have been following for her sleep disorder. She now presents with a history of a L facial pain, which started about 3 weeks ago.   I last saw her on 03/22/2016, at which time she reported that she had been off of Adderall for the past 3 months but was not doing as well as before. Chest being on Nuvigil was not helpful as much. She had taken herself off of the Adderall in the hope of getting by without it. She had also more issues with attention and focus and misplacing things.  She had some knee pain. She was tolerating the Nuvigil.  Today, /05/10/2016: She reports a fall in March 2017, knee gave out, while walking her dog, fell on cement, broke her sun glasses and sprained her right ankle, fell on L side of face and teeth touched  the dirt. She went to see dentist, felt sore in the teeth, but no injuries were seen, no loose crowns. She had a tooth X ray on 04/10/16, after she saw Dr. Loyal Gambler. She has a short lived severe lightning like pain, sometimes burning, seems to be triggered by cold air, opening mouth or sudden turn of head. Of note, the pain severity was very severe but very brief, not easy to locate exactly if it's closer to upper jaw or lower jaw but severe enough to make her cry she states. Saw Dr. Marcene Duos who is a friend of the family, and was told this could be TGN and she was prescribed carbamazepine 100 mg bid. She feels improved. Of note, she saw her ENT, Dr. Benjamine Mola recently tool. Of note, she had an MRI brain w/wo contrast on 10/10/09: IMPRESSION: Abnormal, asymmetric fluid collection in the right petrous apex as compared to the left; the lesion displays a prolonged T2, an intermediate to short T1, displays peripheral greater than central post contrast enhancement, and could represent an inflammatory process. CT temporal bone recommend further evaluation.  No acute or focal intracranial findings.  Previously:  I saw her on 09/22/2015, at which time she reported that the increase in Nuvigil helped. She felt much improved. She was more alert at work and had no side effects. She stopped using her energy drinks that she was utilizing before. In fact, she also felt she could do without Adderall. She was in the process of reducing the  Adderall. She had recent blood work with her PCP. She had a borderline cholesterol. We mutually agreed to continue with Nuvigil 250 mg once daily before she would go to work. We talked about her sleep study results at the time. She had a mean sleep latency of 12.1 minute, but did achieve REM sleep in 2 out of 4 naps, nap #1 and 2, in the context of decreased REM percentage and delayed REM onset during the nighttime sleep study, findings in conjunction with her clinical history suggestive of  delayed sleep phase syndrome and shift work sleep disorder.   I saw her on 06/23/2015, at which time we talked about her PSG and MSLT from 06/08/2015 as well as 06/09/2015, respectively. She had no significant sleep disordered breathing. She had mild PLMS during the nighttime sleep study without significant arousals. She had no significant RLS symptoms. Her nap study showed a mean sleep latency of 12.1 minutes as well as to REM onset naps out of 4 naps, in nap #1 and 2. She gave no telltale history of narcolepsy and her nighttime sleep study showed a decreased percentage of REM sleep in a prolonged REM latency as well as an increased percentage of light stage sleep. I felt that in light of her shift work disorder this was most likely a circadian rhythm disorder of his shift work type. She was advised to try Nuvigil 150 mg strength once daily before going to work. She was also advised to try to bright light therapy when she first wakes up and melatonin which he goes to bed. On 07/15/15 she called and reported residual sleepiness. We increased her Nuvigil to 250 mg once daily.   I first met her on 05/12/2015 at the request of her primary care provider, at which time she reported snoring, excessive daytime somnolence, nonrestorative sleep and shift work for 6 years. I invited her back for sleep study at night, followed by a daytime MSLT. She had a baseline sleep study on 06/08/2015 and a next a nap study on 06/09/2015 and I went over her test results with her in detail today. Her nighttime sleep study showed a sleep efficiency of reduced at 77.7% with a normal sleep latency of 16.5 minutes and wake after sleep onset of 88.5 minutes with mild to moderate sleep fragmentation noted. The arousal index was 13.8 per hour, primarily secondary to spontaneous arousals. She had an increased percentage of stage I and stage II sleep at 14.1 and 73.5% respectively, absence of slow-wave sleep and a decreased percentage of REM  sleep at 12.4% with a prolonged REM latency of 164 minutes. She had mild PLMS with an index of 6.2 per hour, resulting in 1.5 arousals per hour. She had no significant EKG changes. She had mild intermittent snoring. Total AHI was 1 per hour, average oxygen saturation was 95%, nadir was 90%. She had a next a MSLT on 06/09/2015 which showed a mean sleep latency of 12.5 minutes indicating mild sleepiness. REM sleep was noted during nap #1 and 2. I felt that her studies did not support the diagnosis of narcolepsy or idiopathic hypersomnolence based on a mean sleep latency of over 8 minutes, the fact that she had decreased REM sleep and delay and REM sleep during the nighttime study and the fact that she had REM sleep during nap #1 and 2 only, would support a circadian rhythm disorder such as delayed sleep phase syndrome, and could reflect shift work disorder based on her work schedule and sleep  schedule typically.   05/12/2015: She reports snoring and excessive daytime somnolence. She has not felt rested in years, she can fall asleep anytime and fall asleep at the drop of a hat. She does not need to have the TV on at night. She works from 4 PM to 2 AM, M-Th, off on Friday through Sun. She has had this work schedule for 6 years. She works in [employer], running a machine, never had issues performing at works, never fallen asleep at the wheel. She denies episodes of sleep paralysis, hypnagogic or hypnopompic hallucinations, or cataplexy. She feels that she rarely dreams. When she dreams these are not good dreams. She denies restless leg symptoms. She is a restless sleeper. She does not wake up rested. She has had sleepiness during the day for years. She goes to bed around 3 AM but may not fall asleep until 4 or 4:30. Rise time is between 10 AM and noon. On Fridays and Saturdays as well as Sundays she goes to bed around 11 PM. She does not have a family history of obstructive sleep apnea. She has allergy  symptoms. She had nasal surgery years ago for deviated septum. She denies morning headaches. She has nocturia, typically once or twice per night. Her Epworth sleepiness score is 14 out of 24 today, her fatigue scores 46 out of 63. She does not smoke. She drinks alcohol occasionally. She drinks caffeine in the form of 5 hour energy drinks once a day. She has been on Adderall XR 20 mg once daily for about a year. It has helped her concentration but not necessarily her sleepiness. She reports memory issues. She feels that she is becoming forgetful. She has a family history of heart disease and stroke. She lives alone. Her boyfriend has commented on her snoring that has not told her that she has pauses in her breathing. She has woken herself up with a sense of gagging at times. I reviewed your office note from 03/21/15, which you kindly included. She had blood work in July through her [place], and her synthroid was increased.     REVIEW OF SYSTEMS: Out of a complete 14 system review of symptoms, the patient complains only of the following symptoms, and all other reviewed systems are negative.  ALLERGIES: No Known Allergies  HOME MEDICATIONS: Outpatient Medications Prior to Visit  Medication Sig Dispense Refill  . acetaminophen (TYLENOL) 325 MG tablet Take 2 tablets (650 mg total) by mouth every 6 (six) hours as needed for mild pain (or Fever >/= 101).    . Ascorbic Acid (VITAMIN C) 1000 MG tablet Take 1,000 mg by mouth daily.    Marland Kitchen aspirin EC 325 MG EC tablet 1 tab a day for the next 30 days to prevent blood clots 30 tablet 0  . Biotin 10000 MCG TABS Take 10,000 Units by mouth daily.    . carboxymethylcellulose (REFRESH PLUS) 0.5 % SOLN Place 2 drops into both eyes 2 (two) times daily as needed (eye dryness).    . Cholecalciferol (VITAMIN D3) 2000 units TABS Take 2,000 Units by mouth daily.    . fish oil-omega-3 fatty acids 1000 MG capsule Take 2 g by mouth daily.    Marland Kitchen ibuprofen (ADVIL,MOTRIN) 800 MG  tablet Take 1 tablet (800 mg total) by mouth every 8 (eight) hours as needed. 60 tablet 1  . levothyroxine (SYNTHROID, LEVOTHROID) 100 MCG tablet TAKE 1 TABLET BY MOUTH DAILY BEFORE BREAKFAST. 30 tablet 1  . VITAMIN A PO Take  2,400 Units by mouth daily.    . vitamin E 400 UNIT capsule Take 400 Units by mouth daily.    Marland Kitchen ALPRAZolam (XANAX) 0.25 MG tablet 1 tablet every 8 hrs as needed for anxiety or sleep (Patient not taking: Reported on 01/05/2017) 30 tablet 0  . docusate sodium (COLACE) 100 MG capsule 1 tab 2 times a day while on narcotics.  STOOL SOFTENER (Patient not taking: Reported on 01/05/2017) 60 capsule 0  . ondansetron (ZOFRAN) 4 MG tablet Take 1 tablet (4 mg total) by mouth every 6 (six) hours as needed for nausea. (Patient not taking: Reported on 01/05/2017) 20 tablet 0  . oxyCODONE (OXY IR/ROXICODONE) 5 MG immediate release tablet 1-2 tablets every 4-6 hrs as needed for pain (Patient not taking: Reported on 09/28/2016) 80 tablet 0  . oxyCODONE-acetaminophen (PERCOCET) 10-325 MG tablet Take 1 tablet by mouth every 4 (four) hours as needed for pain.    . polyethylene glycol powder (GLYCOLAX/MIRALAX) powder 17grams in 6 oz of water twice a day until bowel movement.  LAXITIVE.  Restart if two days since last bowel movement (Patient not taking: Reported on 01/05/2017) 255 g 0   No facility-administered medications prior to visit.     PAST MEDICAL HISTORY: Past Medical History:  Diagnosis Date  . Adult attention deficit disorder   . Arthritis   . Complication of anesthesia    "hard to wake up"  . Constipation   . Fatigue 03/07/2015  . Headache(784.0)   . Hematuria 04/12/2013  . Hypothyroidism   . Postmenopause 03/07/2015  . Primary localized osteoarthritis of left knee 08/26/2016  . Sleep disturbance 03/07/2015  . Snoring   . Thyroid disease   . Trigeminal neuralgia pain     PAST SURGICAL HISTORY: Past Surgical History:  Procedure Laterality Date  . ABDOMINAL HYSTERECTOMY    .  BREAST CYST ASPIRATION  2015  . CARPAL TUNNEL RELEASE Right 07/2016  . COLONOSCOPY  09/24/2011   VOZ:DGUYQIH polyps in the ascending colon/diverticula in the sigmoid colon/internal hemorrhoids  . cosmetic eye surgery    . fibroids removed from uterus    . HYSTEROSCOPY W/D&C  12/03/2011   Procedure: DILATATION AND CURETTAGE /HYSTEROSCOPY;  Surgeon: Jonnie Kind, MD;  Location: [facility];  Service: Gynecology;  Laterality: N/A;  . KNEE ARTHROSCOPY W/ DEBRIDEMENT    . LAPAROSCOPIC SALPINGO OOPHERECTOMY  07/11/2012   Procedure: LAPAROSCOPIC SALPINGO OOPHORECTOMY;  Surgeon: Jonnie Kind, MD;  Location: [facility];  Service: Gynecology;  Laterality: Bilateral;  . LAPAROSCOPIC SUPRACERVICAL HYSTERECTOMY  07/11/2012   Procedure: LAPAROSCOPIC SUPRACERVICAL HYSTERECTOMY;  Surgeon: Jonnie Kind, MD;  Location: [facility];  Service: Gynecology;  Laterality: N/A;  . LASIK    . NASAL SEPTUM SURGERY    . POLYPECTOMY  12/03/2011   Procedure: POLYPECTOMY;  Surgeon: Jonnie Kind, MD;  Location: [facility];  Service: Gynecology;  Laterality: N/A;  resection of polyp  . TOTAL KNEE ARTHROPLASTY Left 09/06/2016  . TOTAL KNEE ARTHROPLASTY Left 09/06/2016   Procedure: TOTAL KNEE ARTHROPLASTY;  Surgeon: Elsie Saas, MD;  Location: [place];  Service: Orthopedics;  Laterality: Left;  . TRIGGER FINGER RELEASE  12/2016  . VEIN SURGERY     Varicose veins    FAMILY HISTORY: Family History  Problem Relation Age of Onset  . Colon cancer Maternal Uncle   . Cancer Sister   . Thyroid disease Sister   . Breast cancer Sister   . Heart disease Father   . Hypertension Father   .  Stroke Mother   . Asthma Mother   . Hypertension Mother   . Breast cancer Cousin     SOCIAL HISTORY: Social History   Social History  . Marital status: Divorced    Spouse name: N/A  . Number of children: 0  . Years of education: College   Occupational History  . Not on file.   Social History Main Topics  . Smoking status: Never Smoker    . Smokeless tobacco: Never Used  . Alcohol use 0.6 oz/week    1 Shots of liquor per week     Comment: 1-2 times a week  . Drug use: No  . Sexual activity: Yes    Birth control/ protection: Surgical     Comment: supracervical hyst   Other Topics Concern  . Not on file   Social History Narrative   Drinks about 5- 5 Hour [employer] a week       PHYSICAL EXAM  Vitals:   01/05/17 0806  BP: 119/75  Pulse: 74  Weight: 144 lb (65.3 kg)  Height: 5' 5" (1.651 m)   Body mass index is 23.96 kg/m.  Generalized: Well developed, in no acute distress   Neurological examination  Mentation: Alert oriented to time, place, history taking. Follows all commands speech and language fluent Cranial nerve II-XII: Pupils were equal round reactive to light. Extraocular movements were full, visual field were full on confrontational test. Facial sensation and strength were normal. Uvula tongue midline. Head turning and shoulder shrug  were normal and symmetric. Motor: The motor testing reveals 5 over 5 strength of all 4 extremities. Good symmetric motor tone is noted throughout.  Sensory: Sensory testing is intact to soft touch on all 4 extremities. No evidence of extinction is noted.  Coordination: Cerebellar testing reveals good finger-nose-finger and heel-to-shin bilaterally.  Gait and station: Slight limp on the left due to knee replacement. Tandem gait not attempted Reflexes: Deep tendon reflexes are symmetric and normal in the upper extremities. Hypoeflexic in the left lower extremity  DIAGNOSTIC DATA (LABS, IMAGING, TESTING) - I reviewed patient records, labs, notes, testing and imaging myself where available.  Lab Results  Component Value Date   WBC 8.5 09/07/2016   HGB 12.8 09/07/2016   HCT 39.2 09/07/2016   MCV 94.5 09/07/2016   PLT 168 09/07/2016      Component Value Date/Time   NA 136 09/07/2016 0351   NA 141 03/07/2015 1200   K 4.5 09/07/2016 0351   CL 103 09/07/2016 0351    CO2 27 09/07/2016 0351   GLUCOSE 154 (H) 09/07/2016 0351   BUN 7 09/07/2016 0351   BUN 12 03/07/2015 1200   CREATININE 0.67 09/07/2016 0351   CREATININE 0.70 02/28/2014 1208   CALCIUM 9.0 09/07/2016 0351   PROT 6.5 08/31/2016 0939   PROT 6.6 03/07/2015 1200   ALBUMIN 3.7 08/31/2016 0939   ALBUMIN 4.3 03/07/2015 1200   AST 28 08/31/2016 0939   ALT 24 08/31/2016 0939   ALKPHOS 124 08/31/2016 0939   BILITOT 0.7 08/31/2016 0939   BILITOT 0.4 03/07/2015 1200   GFRNONAA >60 09/07/2016 0351   GFRAA >60 09/07/2016 0351     Lab Results  Component Value Date   TSH 4.320 07/07/2015      ASSESSMENT AND PLAN 56 y.o. year old female  has a past medical history of Adult attention deficit disorder; Arthritis; Complication of anesthesia; Constipation; Fatigue (03/07/2015); Headache(784.0); Hematuria (04/12/2013); Hypothyroidism; Postmenopause (03/07/2015); Primary localized osteoarthritis of left knee (08/26/2016); Sleep  disturbance (03/07/2015); Snoring; Thyroid disease; and Trigeminal neuralgia pain. here with:  1. Trigeminal neuralgia on the left 2. Hypersomnolence  The patients pain related to Trigeminal neuralgia has returned. I have advised that she should restart carbamazepine taking 100 mg twice a day. She voiced understanding. She can hold off on taking armodafinil until she returns to work. I have encouraged the patient to establish a regular sleep routine as this also may be beneficial for hypersomnolence. Patient voiced understanding. She will follow-up in 6 months or sooner if needed.    Ward Givens, MSN, NP-C 01/05/2017, 8:19 AM [facility] [address] [place], [place] [address] [phone]  I reviewed the above note and documentation by the Nurse Practitioner and agree with the history, physical exam, assessment and plan as outlined above. I was immediately available for face-to-face consultation. Star Age, MD, PhD [facility])

## 2017-01-05 NOTE — Patient Instructions (Signed)
Restart Carbamazepine 100 mg twice a day Establish a regular sleep routine If your symptoms worsen or you develop new symptoms please let us know.

## 2017-01-14 NOTE — Addendum Note (Signed)
Addendum  created 01/14/17 0947 by Rica Koyanagi, MD   Sign clinical note

## 2017-01-20 DIAGNOSIS — E039 Hypothyroidism, unspecified: Secondary | ICD-10-CM | POA: Diagnosis not present

## 2017-01-20 DIAGNOSIS — E785 Hyperlipidemia, unspecified: Secondary | ICD-10-CM | POA: Diagnosis not present

## 2017-01-22 ENCOUNTER — Other Ambulatory Visit: Payer: Self-pay | Admitting: Adult Health

## 2017-01-25 DIAGNOSIS — M1712 Unilateral primary osteoarthritis, left knee: Secondary | ICD-10-CM | POA: Diagnosis not present

## 2017-01-25 DIAGNOSIS — E039 Hypothyroidism, unspecified: Secondary | ICD-10-CM | POA: Diagnosis not present

## 2017-02-08 DIAGNOSIS — Z96652 Presence of left artificial knee joint: Secondary | ICD-10-CM | POA: Diagnosis not present

## 2017-02-25 DIAGNOSIS — Z79899 Other long term (current) drug therapy: Secondary | ICD-10-CM | POA: Diagnosis not present

## 2017-03-07 ENCOUNTER — Encounter (HOSPITAL_COMMUNITY): Payer: Self-pay

## 2017-03-07 NOTE — Therapy (Signed)
[place], [place], [address] Phone: [phone]   Fax:  [phone]  Patient Details  Name: Yvette Joyce MRN: [id number] Date of Birth: 1960-11-01 Referring Provider:  No ref. provider found  Encounter Date: 03/07/2017   Per telephone encounter documented on 10/21/16, pt asked to be d/c'd so that she could attend therapy at Dr. Archie Endo office.  PHYSICAL THERAPY DISCHARGE SUMMARY  Visits from Start of Care: 7  Current functional level related to goals / functional outcomes: See last treatment note   Remaining deficits: See last treatment note   Education / Equipment: n/a Plan: Patient agrees to discharge.  Patient goals were not met. Patient is being discharged due to the patient's request.  ?????      Geraldine Solar PT, [place] [address] [place], [place], [address] Phone: [phone]   Fax:  [phone]

## 2017-03-18 ENCOUNTER — Other Ambulatory Visit: Payer: 59 | Admitting: Adult Health

## 2017-04-07 ENCOUNTER — Ambulatory Visit (INDEPENDENT_AMBULATORY_CARE_PROVIDER_SITE_OTHER): Payer: 59 | Admitting: Podiatry

## 2017-04-07 ENCOUNTER — Encounter: Payer: Self-pay | Admitting: Podiatry

## 2017-04-07 VITALS — BP 137/85 | HR 71 | Resp 16

## 2017-04-07 DIAGNOSIS — B351 Tinea unguium: Secondary | ICD-10-CM | POA: Diagnosis not present

## 2017-04-07 MED ORDER — TERBINAFINE HCL 250 MG PO TABS: 250.0000 mg | ORAL_TABLET | Freq: Every day | ORAL | 0 refills | Status: DC

## 2017-04-07 NOTE — Progress Notes (Signed)
   Subjective:    Patient ID: Yvette Joyce, female    DOB: 08-09-1961, 56 y.o.   MRN: [id number]  HPI Chief Complaint  Patient presents with  . Nail Problem    Bilateral; great toe; nail discoloration & thickened nail; pt stated, "Saw Dr. Melony Overly a year ago and got Terbinafine for nail fungus - completed a week ago"      Review of Systems  Eyes: Positive for redness.  All other systems reviewed and are negative.      Objective:   Physical Exam        Assessment & Plan:

## 2017-04-08 ENCOUNTER — Telehealth: Payer: Self-pay | Admitting: Podiatry

## 2017-04-08 NOTE — Telephone Encounter (Signed)
Yes I was there yesterday and saw Dr. Paulla Dolly. When I left I was scheduled for a follow up appointment in September for the laser. I was supposed to get some cream, but I never got it. Can you send that to my pharmacy instead of me having to drive back over there since I don't have another appointment until September. My pharmacy is on my record.

## 2017-04-08 NOTE — Progress Notes (Signed)
Subjective:    Patient ID: Yvette Joyce, female   DOB: 56 y.o.   MRN: [id number]   HPI patient presents with nail disease bilateral and states that she is desperate to get rid of it and she also has to start wearing steel toe shoes. She has discoloration of the hallux nailbed right over left is very concerned about this and has taken 2 months oral antifungal and did have liver functions which were normal    Review of Systems  All other systems reviewed and are negative.       Objective:  Physical Exam  Constitutional: She appears well-developed and well-nourished.  Cardiovascular: Intact distal pulses.   Pulmonary/Chest: Breath sounds normal.  Musculoskeletal: Normal range of motion.  Neurological: She is alert.  Skin: Skin is warm.  Nursing note and vitals reviewed.  neurovascular status intact muscle strength adequate range of motion within normal limits with patient noted to have yellow discoloration of the hallux nail right over left with moderate lifting of the nailbed thinness the bed and also indications of trauma     Assessment:    Combination of mycotic nail infection with probable trauma of the hallux nails with previous history of having done well but feels like she wants something more to try to get this better     Plan:   H&P conditions reviewed and at this point I do think that we can attempt a combination of laser 30 more days of oral and topical. I educated her that this absolutely may not solve her problem and she may need some kind of pulse therapy in the future but at this point this probably the best chance from an aggressive conservative standpoint that we can do and she is interested in this and we did start this at this time

## 2017-04-15 ENCOUNTER — Ambulatory Visit (INDEPENDENT_AMBULATORY_CARE_PROVIDER_SITE_OTHER): Payer: 59 | Admitting: Podiatry

## 2017-04-15 ENCOUNTER — Encounter: Payer: Self-pay | Admitting: Podiatry

## 2017-04-15 DIAGNOSIS — L600 Ingrowing nail: Secondary | ICD-10-CM | POA: Diagnosis not present

## 2017-04-15 MED ORDER — HYDROCODONE-IBUPROFEN 5-200 MG PO TABS: 1.0000 | ORAL_TABLET | Freq: Three times a day (TID) | ORAL | 0 refills | Status: DC | PRN

## 2017-04-15 NOTE — Progress Notes (Signed)
Subjective:    Patient ID: Yvette Joyce, female   DOB: 56 y.o.   MRN: [id number]   HPI patient presents stating the nail has become very sore on the right big toe and she feels like it's loose and it's hurting in the corners    ROS      Objective:  Physical Exam neurovascular status intact with yellow nail that she was given start antifungal treatment for but it's become very painful and also is now ingrown in each corner     Assessment:  Loose hallux nail right with damage with incurvated corners medial lateral border      Plan:    H&P and condition discussed. I've recommended removal of the nail as it's loose with chemical application to the corners The rest the nail grow out and we'll probably still needs long-term laser therapy. Patient wants to undergo this treatment and understands that the nail may not grow out normally and may ultimately require permanent removal of the entire bed. Today I went ahead infiltrated the right hallux 60 mg Xylocaine Marcaine mixture removed the nail and then on the corners applied phenol 3 applications 30 seconds followed by alcohol lavage and sterile dressing. Reappoint 5 months or earlier if needed

## 2017-04-15 NOTE — Patient Instructions (Signed)

## 2017-04-16 ENCOUNTER — Encounter: Payer: Self-pay | Admitting: Sports Medicine

## 2017-04-16 NOTE — Progress Notes (Signed)
Patient called stating that she had an ingrown nail procedure and was given pain medication by Dr. Paulla Dolly but wasn't able to get it filled at the pharmacy because the medication was not available. I called in Tramadol instead to her pharmacy and advised her to wear comfortable shoes to avoid increased pain. Patient expressed understanding.  Dr. Cannon Kettle

## 2017-04-19 ENCOUNTER — Ambulatory Visit: Payer: 59

## 2017-05-20 DIAGNOSIS — Z6821 Body mass index (BMI) 21.0-21.9, adult: Secondary | ICD-10-CM | POA: Diagnosis not present

## 2017-05-20 DIAGNOSIS — G500 Trigeminal neuralgia: Secondary | ICD-10-CM | POA: Diagnosis not present

## 2017-05-20 DIAGNOSIS — H6500 Acute serous otitis media, unspecified ear: Secondary | ICD-10-CM | POA: Diagnosis not present

## 2017-05-27 ENCOUNTER — Other Ambulatory Visit: Payer: 59 | Admitting: Adult Health

## 2017-05-27 ENCOUNTER — Encounter: Payer: Self-pay | Admitting: Adult Health

## 2017-05-27 ENCOUNTER — Ambulatory Visit (INDEPENDENT_AMBULATORY_CARE_PROVIDER_SITE_OTHER): Payer: 59 | Admitting: Adult Health

## 2017-05-27 ENCOUNTER — Other Ambulatory Visit (HOSPITAL_COMMUNITY)
Admission: RE | Admit: 2017-05-27 | Discharge: 2017-05-27 | Disposition: A | Discharge status: Home / Self Care | Payer: 59 | Source: Ambulatory Visit | Attending: Adult Health | Admitting: Adult Health

## 2017-05-27 VITALS — BP 122/80 | HR 78 | Resp 16 | Ht 66.0 in | Wt 131.0 lb

## 2017-05-27 DIAGNOSIS — Z1212 Encounter for screening for malignant neoplasm of rectum: Secondary | ICD-10-CM | POA: Diagnosis not present

## 2017-05-27 DIAGNOSIS — Z1211 Encounter for screening for malignant neoplasm of colon: Secondary | ICD-10-CM

## 2017-05-27 DIAGNOSIS — Z01411 Encounter for gynecological examination (general) (routine) with abnormal findings: Secondary | ICD-10-CM

## 2017-05-27 DIAGNOSIS — Z01419 Encounter for gynecological examination (general) (routine) without abnormal findings: Secondary | ICD-10-CM | POA: Diagnosis present

## 2017-05-27 DIAGNOSIS — R195 Other fecal abnormalities: Secondary | ICD-10-CM | POA: Diagnosis not present

## 2017-05-27 DIAGNOSIS — F4320 Adjustment disorder, unspecified: Secondary | ICD-10-CM | POA: Insufficient documentation

## 2017-05-27 DIAGNOSIS — F4321 Adjustment disorder with depressed mood: Secondary | ICD-10-CM

## 2017-05-27 DIAGNOSIS — K649 Unspecified hemorrhoids: Secondary | ICD-10-CM

## 2017-05-27 LAB — HEMOCCULT GUIAC POC 1CARD (OFFICE): FECAL OCCULT BLD: POSITIVE — AB

## 2017-05-27 MED ORDER — HYDROCORTISONE ACE-PRAMOXINE 1-1 % RE FOAM: 1.0000 | Freq: Two times a day (BID) | RECTAL | 0 refills | Status: DC

## 2017-05-27 NOTE — Progress Notes (Signed)
Patient ID: TAHTIANA ROZIER, female   DOB: Mar 27, 1961, 56 y.o.   MRN: [id number] History of Present Illness: Kalesha is a 56 year old white female in for well woman gyn exam and pap. PCP is Dr Nevada Crane.    Current Medications, Allergies, Past Medical History, Past Surgical History, Family History and Social History were reviewed in [employer] record.     Review of Systems: Patient denies any headaches, hearing loss, fatigue, blurred vision, shortness of breath, chest pain, abdominal pain, problems with bowel movements(has had diarrhea and bleeding since upset over loss of pet), urination, or intercourse. No joint pain or mood swings.    Physical Exam:BP 122/80 (BP Location: Right Arm, Patient Position: Sitting, Cuff Size: Normal)   Pulse 78   Resp 16   Ht 5\' 6"  (1.676 m)   Wt 131 lb (59.4 kg)   LMP 11/25/2011   BMI 21.14 kg/m  General:  Well developed, well nourished, no acute distress Skin:  Warm and dry Neck:  Midline trachea, normal thyroid, good ROM, no lymphadenopathy Lungs; Clear to auscultation bilaterally Breast:  No dominant palpable mass, retraction, or nipple discharge Cardiovascular: Regular rate and rhythm Abdomen:  Soft, non tender, no hepatosplenomegaly Pelvic:  External genitalia is normal in appearance, no lesions.  The vagina is pale and dry. Urethra has no lesions or masses. The cervix is smooth and atrophic, pap with HPV performed.  Uterus is absent.  No adnexal masses or tenderness noted.Bladder is non tender, no masses felt. Rectal: Good sphincter tone, no polyps, + hemorrhoids felt.  Hemoccult + Extremities/musculoskeletal:  No swelling or varicosities noted, no clubbing or cyanosis Psych:  No mood changes, alert and cooperative,seems happy PHQ  9 score 15, she says she is not depressed but is grieving over loss of pet this week.  Impression:  1. Encounter for gynecological examination with Papanicolaou smear of cervix   2. Screening for  colorectal cancer   3. Hemorrhoids, unspecified hemorrhoid type   4. Fecal occult blood test positive   5. Grief reaction      Plan: 3 hemoccult cards sent home with her Meds ordered this encounter  Medications  . hydrocortisone-pramoxine (PROCTOFOAM-HC) rectal foam    Sig: Place 1 applicator rectally 2 (two) times daily.    Dispense:  10 g    Refill:  0    Order Specific Question:   Supervising Provider    Answer:   Florian Buff [[id number]]   Physical in 1 year Pap in 3 if normal Mammogram yearly colonoscopy per GI [facility] with PCP

## 2017-05-31 LAB — CYTOLOGY - PAP
Diagnosis: NEGATIVE
HPV (WINDOPATH): NOT DETECTED

## 2017-06-25 ENCOUNTER — Other Ambulatory Visit (HOSPITAL_COMMUNITY): Payer: Self-pay | Admitting: Internal Medicine

## 2017-06-25 ENCOUNTER — Ambulatory Visit (HOSPITAL_COMMUNITY)
Admission: RE | Admit: 2017-06-25 | Discharge: 2017-06-25 | Disposition: A | Discharge status: Home / Self Care | Payer: 59 | Source: Ambulatory Visit | Attending: Internal Medicine | Admitting: Internal Medicine

## 2017-06-25 DIAGNOSIS — R519 Headache, unspecified: Secondary | ICD-10-CM

## 2017-06-25 DIAGNOSIS — R51 Headache: Principal | ICD-10-CM

## 2017-06-25 DIAGNOSIS — J3489 Other specified disorders of nose and nasal sinuses: Secondary | ICD-10-CM

## 2017-07-06 ENCOUNTER — Ambulatory Visit (INDEPENDENT_AMBULATORY_CARE_PROVIDER_SITE_OTHER): Payer: 59 | Admitting: Adult Health

## 2017-07-06 ENCOUNTER — Encounter: Payer: Self-pay | Admitting: Adult Health

## 2017-07-06 VITALS — BP 116/71 | HR 75 | Ht 65.0 in | Wt 126.6 lb

## 2017-07-06 DIAGNOSIS — G500 Trigeminal neuralgia: Secondary | ICD-10-CM | POA: Diagnosis not present

## 2017-07-06 DIAGNOSIS — Z5181 Encounter for therapeutic drug level monitoring: Secondary | ICD-10-CM

## 2017-07-06 MED ORDER — CARBAMAZEPINE 100 MG PO CHEW: 150.0000 mg | CHEWABLE_TABLET | Freq: Two times a day (BID) | ORAL | 5 refills | Status: DC

## 2017-07-06 NOTE — Patient Instructions (Signed)
Your Plan:  Increase Carbamazepine to 150 mg (1.5 tablets) twice a day Blood work today If your symptoms worsen or you develop new symptoms please let us know.     Thank you for coming to see [place] at [facility]. I hope we have been able to provide you high quality care today.  You may receive a patient satisfaction survey over the next few weeks. We would appreciate your feedback and comments so that we may continue to improve ourselves and the health of our patients.

## 2017-07-06 NOTE — Progress Notes (Addendum)
PATIENT: Yvette Joyce DOB: 1961-08-12  REASON FOR VISIT: follow up HISTORY FROM: patient  HISTORY OF PRESENT ILLNESS: Today 07/06/17 Yvette Joyce is a 56 year old female with a history of hypersomnolence and trigeminal neuralgia on the left side.  She returns today for follow-up.  She is currently on carbamazepine 100 mg twice a day.  She states that she has been having more flareups of pain in the left cheek.  She states that it can occur talking or randomly.  She describes the pain as a sharp shooting pain.  She states that when the pain occurs she feels as if she cannot talk.  She states in the past carbamazepine has controlled her flareups.  She was recently given a prescription of gabapentin but it caused her to be drowsy the next day.  The patient states that she is under a lot of stress.  Her dog of 8 years passed away unexpectedly.  She is also getting ready to be laid off.  Her significant other is with her today and feels that stress is a trigger for her flareups.  She returns today for an evaluation.  HISTORY: Yvette Joyce is a 56 year old female with a history of hypersomnolence and trigeminal neuralgia on the left. She returns today for follow-up. She states that she has not been taking her carbamazepine nor armodafinil. She states that she stopped carbamazepine and was able to be off the medication for 3 or 4 months without much discomfort however recently she is beginning to have pain in the left cheek and into the teeth. She states that she's been out of work since December. She's had 2 hand surgeries and knee replacement. She will be returning to work in July. For that reason she has not been taken armodafinil. She will be taking a first shift position when she returns and is hoping that she will not require this medication. She denies any new neurological symptoms. She returns today for an evaluation.  REVIEW OF SYSTEMS: Out of a complete 14 system review of symptoms, the patient complains  only of the following symptoms, and all other reviewed systems are negative.  Eye redness, daytime sleepiness  ALLERGIES: No Known Allergies  HOME MEDICATIONS: Outpatient Medications Prior to Visit  Medication Sig Dispense Refill  . carbamazepine (TEGRETOL) 100 MG chewable tablet Chew 1 tablet (100 mg total) by mouth 2 (two) times daily. 60 tablet 5  . hydrocortisone-pramoxine (PROCTOFOAM-HC) rectal foam Place 1 applicator rectally 2 (two) times daily. 10 g 0  . ibuprofen (ADVIL,MOTRIN) 800 MG tablet Take 1 tablet (800 mg total) by mouth every 8 (eight) hours as needed. 60 tablet 1  . levothyroxine (SYNTHROID, LEVOTHROID) 100 MCG tablet TAKE 1 TABLET BY MOUTH DAILY BEFORE BREAKFAST. 30 tablet 2   No facility-administered medications prior to visit.     PAST MEDICAL HISTORY: Past Medical History:  Diagnosis Date  . Adult attention deficit disorder   . Arthritis   . Complication of anesthesia    "hard to wake up"  . Constipation   . Fatigue 03/07/2015  . Headache(784.0)   . Hematuria 04/12/2013  . Hypothyroidism   . Postmenopause 03/07/2015  . Primary localized osteoarthritis of left knee 08/26/2016  . Sleep disturbance 03/07/2015  . Snoring   . Thyroid disease   . Trigeminal neuralgia pain     PAST SURGICAL HISTORY: Past Surgical History:  Procedure Laterality Date  . ABDOMINAL HYSTERECTOMY    . BREAST CYST ASPIRATION  2015  . CARPAL TUNNEL  RELEASE Right 07/2016  . COLONOSCOPY  09/24/2011   GMW:NUUVOZD polyps in the ascending colon/diverticula in the sigmoid colon/internal hemorrhoids  . cosmetic eye surgery    . fibroids removed from uterus    . HYSTEROSCOPY W/D&C  12/03/2011   Procedure: DILATATION AND CURETTAGE /HYSTEROSCOPY;  Surgeon: Jonnie Kind, MD;  Location: [facility];  Service: Gynecology;  Laterality: N/A;  . KNEE ARTHROSCOPY W/ DEBRIDEMENT    . LAPAROSCOPIC SALPINGO OOPHERECTOMY  07/11/2012   Procedure: LAPAROSCOPIC SALPINGO OOPHORECTOMY;  Surgeon: Jonnie Kind, MD;  Location: [facility];  Service: Gynecology;  Laterality: Bilateral;  . LAPAROSCOPIC SUPRACERVICAL HYSTERECTOMY  07/11/2012   Procedure: LAPAROSCOPIC SUPRACERVICAL HYSTERECTOMY;  Surgeon: Jonnie Kind, MD;  Location: [facility];  Service: Gynecology;  Laterality: N/A;  . LASIK    . NASAL SEPTUM SURGERY    . POLYPECTOMY  12/03/2011   Procedure: POLYPECTOMY;  Surgeon: Jonnie Kind, MD;  Location: [facility];  Service: Gynecology;  Laterality: N/A;  resection of polyp  . TOTAL KNEE ARTHROPLASTY Left 09/06/2016  . TOTAL KNEE ARTHROPLASTY Left 09/06/2016   Procedure: TOTAL KNEE ARTHROPLASTY;  Surgeon: Elsie Saas, MD;  Location: [place];  Service: Orthopedics;  Laterality: Left;  . TRIGGER FINGER RELEASE  12/2016  . VEIN SURGERY     Varicose veins    FAMILY HISTORY: Family History  Problem Relation Age of Onset  . Colon cancer Maternal Uncle   . Cancer Sister   . Thyroid disease Sister   . Breast cancer Sister   . Heart disease Father   . Hypertension Father   . Stroke Mother   . Asthma Mother   . Hypertension Mother   . Breast cancer Cousin     SOCIAL HISTORY: Social History   Socioeconomic History  . Marital status: Divorced    Spouse name: Not on file  . Number of children: 0  . Years of education: College  . Highest education level: Not on file  Social Needs  . Financial resource strain: Not on file  . Food insecurity - worry: Not on file  . Food insecurity - inability: Not on file  . Transportation needs - medical: Not on file  . Transportation needs - non-medical: Not on file  Occupational History  . Not on file  Tobacco Use  . Smoking status: Never Smoker  . Smokeless tobacco: Never Used  Substance and Sexual Activity  . Alcohol use: Yes    Alcohol/week: 0.6 oz    Types: 1 Shots of liquor per week    Comment: 1-2 times a week  . Drug use: No  . Sexual activity: Yes    Birth control/protection: Surgical    Comment: supracervical hyst  Other Topics  Concern  . Not on file  Social History Narrative   Drinks about 5- 5 Hour Energys a week       PHYSICAL EXAM  Vitals:   07/06/17 1448  BP: 116/71  Pulse: 75  Weight: 126 lb 9.6 oz (57.4 kg)  Height: 5\' 5"  (1.651 m)   Body mass index is 21.07 kg/m.  Generalized: Well developed, in no acute distress   Neurological examination  Mentation: Alert oriented to time, place, history taking. Follows all commands speech and language fluent Cranial nerve II-XII: Pupils were equal round reactive to light. Extraocular movements were full, visual field were full on confrontational test. Facial sensation and strength were normal. Uvula tongue midline. Head turning and shoulder shrug  were normal and symmetric. Motor: The  motor testing reveals 5 over 5 strength of all 4 extremities. Good symmetric motor tone is noted throughout.  Sensory: Sensory testing is intact to soft touch on all 4 extremities. No evidence of extinction is noted.  Coordination: Cerebellar testing reveals good finger-nose-finger and heel-to-shin bilaterally.  Gait and station: Gait is normal. Tandem gait is normal. Romberg is negative. No drift is seen.  Reflexes: Deep tendon reflexes are symmetric and normal bilaterally.   DIAGNOSTIC DATA (LABS, IMAGING, TESTING) - I reviewed patient records, labs, notes, testing and imaging myself where available.  Lab Results  Component Value Date   WBC 8.5 09/07/2016   HGB 12.8 09/07/2016   HCT 39.2 09/07/2016   MCV 94.5 09/07/2016   PLT 168 09/07/2016      Component Value Date/Time   NA 136 09/07/2016 0351   NA 141 03/07/2015 1200   K 4.5 09/07/2016 0351   CL 103 09/07/2016 0351   CO2 27 09/07/2016 0351   GLUCOSE 154 (H) 09/07/2016 0351   BUN 7 09/07/2016 0351   BUN 12 03/07/2015 1200   CREATININE 0.67 09/07/2016 0351   CREATININE 0.70 02/28/2014 1208   CALCIUM 9.0 09/07/2016 0351   PROT 6.5 08/31/2016 0939   PROT 6.6 03/07/2015 1200   ALBUMIN 3.7 08/31/2016 0939    ALBUMIN 4.3 03/07/2015 1200   AST 28 08/31/2016 0939   ALT 24 08/31/2016 0939   ALKPHOS 124 08/31/2016 0939   BILITOT 0.7 08/31/2016 0939   BILITOT 0.4 03/07/2015 1200   GFRNONAA >60 09/07/2016 0351   GFRAA >60 09/07/2016 0351        ASSESSMENT AND PLAN 56 y.o. year old female  has a past medical history of Adult attention deficit disorder, Arthritis, Complication of anesthesia, Constipation, Fatigue (03/07/2015), Headache(784.0), Hematuria (04/12/2013), Hypothyroidism, Postmenopause (03/07/2015), Primary localized osteoarthritis of left knee (08/26/2016), Sleep disturbance (03/07/2015), Snoring, Thyroid disease, and Trigeminal neuralgia pain. here with:  1.  Trigeminal neuralgia  The patient has been having more flareups.  I will increase carbamazepine to 150 mg twice a day.  I will check blood work today.  Patient advised that if the increase in carbamazepine is not beneficial she should let us know.  She will follow-up in 6 months or sooner if needed.  I spent 15 minutes with the patient. 50% of this time was spent discussing her medication and treatment.    Ward Givens, MSN, NP-C 07/06/2017, 11:40 AM [facility] [address], [place], [place] [address] [phone]  I reviewed the above note and documentation by the Nurse Practitioner and agree with the history, physical exam, assessment and plan as outlined above. I was immediately available for face-to-face consultation. Star Age, MD, PhD [facility])

## 2017-07-07 LAB — CBC WITH DIFFERENTIAL/PLATELET
BASOS ABS: 0 10*3/uL (ref 0.0–0.2)
Basos: 1 %
EOS (ABSOLUTE): 0.1 10*3/uL (ref 0.0–0.4)
Eos: 2 %
HEMATOCRIT: 38.2 % (ref 34.0–46.6)
HEMOGLOBIN: 13.2 g/dL (ref 11.1–15.9)
Immature Grans (Abs): 0 10*3/uL (ref 0.0–0.1)
Immature Granulocytes: 0 %
LYMPHS ABS: 1 10*3/uL (ref 0.7–3.1)
LYMPHS: 26 %
MCH: 31.4 pg (ref 26.6–33.0)
MCHC: 34.6 g/dL (ref 31.5–35.7)
MCV: 91 fL (ref 79–97)
MONOS ABS: 0.3 10*3/uL (ref 0.1–0.9)
Monocytes: 7 %
NEUTROS ABS: 2.4 10*3/uL (ref 1.4–7.0)
NEUTROS PCT: 64 %
Platelets: 183 10*3/uL (ref 150–379)
RBC: 4.21 x10E6/uL (ref 3.77–5.28)
RDW: 14.2 % (ref 12.3–15.4)
WBC: 3.8 10*3/uL (ref 3.4–10.8)

## 2017-07-07 LAB — COMPREHENSIVE METABOLIC PANEL
A/G RATIO: 1.9 (ref 1.2–2.2)
ALT: 29 IU/L (ref 0–32)
AST: 37 IU/L (ref 0–40)
Albumin: 4 g/dL (ref 3.5–5.5)
Alkaline Phosphatase: 143 IU/L — ABNORMAL HIGH (ref 39–117)
BUN/Creatinine Ratio: 10 (ref 9–23)
BUN: 7 mg/dL (ref 6–24)
Bilirubin Total: 0.3 mg/dL (ref 0.0–1.2)
CALCIUM: 8.9 mg/dL (ref 8.7–10.2)
CO2: 29 mmol/L (ref 20–29)
CREATININE: 0.72 mg/dL (ref 0.57–1.00)
Chloride: 102 mmol/L (ref 96–106)
GFR, EST AFRICAN AMERICAN: 108 mL/min/{1.73_m2} (ref >59)
GFR, EST NON AFRICAN AMERICAN: 94 mL/min/{1.73_m2} (ref >59)
GLOBULIN, TOTAL: 2.1 g/dL (ref 1.5–4.5)
Glucose: 71 mg/dL (ref 65–99)
POTASSIUM: 3.8 mmol/L (ref 3.5–5.2)
Sodium: 145 mmol/L — ABNORMAL HIGH (ref 134–144)
TOTAL PROTEIN: 6.1 g/dL (ref 6.0–8.5)

## 2017-07-07 LAB — CARBAMAZEPINE LEVEL, TOTAL: CARBAMAZEPINE LVL: 4.2 ug/mL (ref 4.0–12.0)

## 2017-07-11 ENCOUNTER — Telehealth: Payer: Self-pay | Admitting: *Deleted

## 2017-07-11 NOTE — Telephone Encounter (Signed)
LVM informing patient her lab work is relatively unremarkable. Advised her sodium is only mildly elevated. Advised her that NP will need to monitor this. Left number for any questions.

## 2017-07-21 DIAGNOSIS — E785 Hyperlipidemia, unspecified: Secondary | ICD-10-CM | POA: Diagnosis not present

## 2017-07-21 DIAGNOSIS — E039 Hypothyroidism, unspecified: Secondary | ICD-10-CM | POA: Diagnosis not present

## 2017-08-06 DIAGNOSIS — E039 Hypothyroidism, unspecified: Secondary | ICD-10-CM | POA: Diagnosis not present

## 2017-08-12 ENCOUNTER — Other Ambulatory Visit: Payer: Self-pay | Admitting: Neurology

## 2017-08-15 DIAGNOSIS — J019 Acute sinusitis, unspecified: Secondary | ICD-10-CM | POA: Diagnosis not present

## 2017-09-12 DIAGNOSIS — Z96652 Presence of left artificial knee joint: Secondary | ICD-10-CM | POA: Diagnosis not present

## 2017-09-16 ENCOUNTER — Ambulatory Visit: Payer: 59 | Admitting: Podiatry

## 2017-09-22 ENCOUNTER — Telehealth: Payer: Self-pay | Admitting: Adult Health

## 2017-09-22 NOTE — Telephone Encounter (Signed)
Left message, not vitamin that I know of, but could try evening primrose or pycnogenal 100 mg bid can be ordered on line or even SSRI's help, call me if any further  questions

## 2017-09-23 ENCOUNTER — Other Ambulatory Visit: Payer: Self-pay | Admitting: Internal Medicine

## 2017-09-23 ENCOUNTER — Ambulatory Visit: Payer: 59 | Admitting: Podiatry

## 2017-09-23 DIAGNOSIS — Z1231 Encounter for screening mammogram for malignant neoplasm of breast: Secondary | ICD-10-CM

## 2017-10-13 ENCOUNTER — Telehealth: Payer: Self-pay | Admitting: Adult Health

## 2017-10-13 NOTE — Telephone Encounter (Signed)
Left message that looks like you have one for 10/21/17 if it is just screening I don't have to put order in

## 2017-10-13 NOTE — Telephone Encounter (Signed)
Patient called stating that she would like Yvette Joyce to place and order for her to get a mammogram. Please contatc pt

## 2017-10-21 ENCOUNTER — Ambulatory Visit
Admission: RE | Admit: 2017-10-21 | Discharge: 2017-10-21 | Disposition: A | Discharge status: Home / Self Care | Payer: 59 | Source: Ambulatory Visit | Attending: Internal Medicine | Admitting: Internal Medicine

## 2017-10-21 DIAGNOSIS — Z1231 Encounter for screening mammogram for malignant neoplasm of breast: Secondary | ICD-10-CM

## 2017-10-25 ENCOUNTER — Other Ambulatory Visit: Payer: Self-pay | Admitting: Internal Medicine

## 2017-10-25 DIAGNOSIS — R921 Mammographic calcification found on diagnostic imaging of breast: Secondary | ICD-10-CM

## 2017-10-28 ENCOUNTER — Ambulatory Visit
Admission: RE | Admit: 2017-10-28 | Discharge: 2017-10-28 | Disposition: A | Discharge status: Home / Self Care | Payer: 59 | Source: Ambulatory Visit | Attending: Internal Medicine | Admitting: Internal Medicine

## 2017-10-28 ENCOUNTER — Other Ambulatory Visit: Payer: Self-pay | Admitting: Internal Medicine

## 2017-10-28 DIAGNOSIS — R921 Mammographic calcification found on diagnostic imaging of breast: Secondary | ICD-10-CM

## 2017-10-28 DIAGNOSIS — Z79899 Other long term (current) drug therapy: Secondary | ICD-10-CM | POA: Diagnosis not present

## 2017-10-28 DIAGNOSIS — G47419 Narcolepsy without cataplexy: Secondary | ICD-10-CM | POA: Diagnosis not present

## 2017-10-28 DIAGNOSIS — G500 Trigeminal neuralgia: Secondary | ICD-10-CM | POA: Diagnosis not present

## 2017-11-03 ENCOUNTER — Ambulatory Visit
Admission: RE | Admit: 2017-11-03 | Discharge: 2017-11-03 | Disposition: A | Discharge status: Home / Self Care | Payer: 59 | Source: Ambulatory Visit | Attending: Internal Medicine | Admitting: Internal Medicine

## 2017-11-03 DIAGNOSIS — R921 Mammographic calcification found on diagnostic imaging of breast: Secondary | ICD-10-CM

## 2017-11-25 ENCOUNTER — Ambulatory Visit (INDEPENDENT_AMBULATORY_CARE_PROVIDER_SITE_OTHER)
Admission: RE | Admit: 2017-11-25 | Discharge: 2017-11-25 | Disposition: A | Discharge status: Home / Self Care | Payer: 59 | Source: Ambulatory Visit | Attending: Internal Medicine | Admitting: Internal Medicine

## 2017-11-25 ENCOUNTER — Ambulatory Visit (INDEPENDENT_AMBULATORY_CARE_PROVIDER_SITE_OTHER): Payer: 59 | Admitting: Internal Medicine

## 2017-11-25 ENCOUNTER — Encounter: Payer: Self-pay | Admitting: Internal Medicine

## 2017-11-25 VITALS — BP 130/84 | HR 85 | Ht 67.0 in | Wt 139.0 lb

## 2017-11-25 DIAGNOSIS — E782 Mixed hyperlipidemia: Secondary | ICD-10-CM | POA: Diagnosis not present

## 2017-11-25 DIAGNOSIS — Z136 Encounter for screening for cardiovascular disorders: Secondary | ICD-10-CM | POA: Diagnosis not present

## 2017-11-25 NOTE — Patient Instructions (Addendum)
Your physician recommends that you schedule a follow-up appointment in: to be determined after CT   Go to [facility] office for calcium score CT now, they plan to work you in     Your physician recommends that you continue on your current medications as directed. Please refer to the Current Medication list given to you today.       Thank you for choosing [facility] !

## 2017-11-25 NOTE — Progress Notes (Signed)
Cardiology Office Note   Date:  11/25/2017   ID:  Yvette Joyce, DOB [birth year], MRN [id number]  PCP:  Celene Squibb, MD  Cardiologist:   Dorris Carnes, MD   Pt referred by Dr Nevada Crane for eval of coronary calcfications      History of Present Illness: Yvette Joyce is a 57 y.o. female with no known hx of CAD   She is followed by Dr Nevada Crane   Pt recently had mammogram   Repeat limited scan of breast showed atheroscleroitc calcifications in the breast     The pt is active   She denies CP   Breathing is OK     Dad died 59  Had MI in 90s  Mother died ofCVA at  33  Mini strokes in 46s Twin and older sister        Current Meds  Medication Sig  . amphetamine-dextroamphetamine (ADDERALL) 20 MG tablet Take 20 mg by mouth 2 (two) times daily.   . carbamazepine (TEGRETOL) 100 MG chewable tablet Chew 1.5 tablets (150 mg total) by mouth 2 (two) times daily.  Marland Kitchen gabapentin (NEURONTIN) 300 MG capsule Take 300 mg by mouth as needed.   Marland Kitchen ibuprofen (ADVIL,MOTRIN) 800 MG tablet Take 1 tablet (800 mg total) by mouth every 8 (eight) hours as needed.  Marland Kitchen levothyroxine (SYNTHROID, LEVOTHROID) 100 MCG tablet TAKE 1 TABLET BY MOUTH DAILY BEFORE BREAKFAST.     Allergies:   Patient has no known allergies.   Past Medical History:  Diagnosis Date  . Adult attention deficit disorder   . Arthritis   . Complication of anesthesia    "hard to wake up"  . Constipation   . Fatigue 03/07/2015  . Headache(784.0)   . Hematuria 04/12/2013  . Hypothyroidism   . Postmenopause 03/07/2015  . Primary localized osteoarthritis of left knee 08/26/2016  . Sleep disturbance 03/07/2015  . Snoring   . Thyroid disease   . Trigeminal neuralgia pain     Past Surgical History:  Procedure Laterality Date  . ABDOMINAL HYSTERECTOMY    . BREAST CYST ASPIRATION  2015  . CARPAL TUNNEL RELEASE Right 07/2016  . COLONOSCOPY  09/24/2011   MCN:OBSJGGE polyps in the ascending colon/diverticula in the sigmoid colon/internal hemorrhoids  .  cosmetic eye surgery    . fibroids removed from uterus    . HYSTEROSCOPY W/D&C  12/03/2011   Procedure: DILATATION AND CURETTAGE /HYSTEROSCOPY;  Surgeon: Jonnie Kind, MD;  Location: [facility];  Service: Gynecology;  Laterality: N/A;  . KNEE ARTHROSCOPY W/ DEBRIDEMENT    . LAPAROSCOPIC SALPINGO OOPHERECTOMY  07/11/2012   Procedure: LAPAROSCOPIC SALPINGO OOPHORECTOMY;  Surgeon: Jonnie Kind, MD;  Location: [facility];  Service: Gynecology;  Laterality: Bilateral;  . LAPAROSCOPIC SUPRACERVICAL HYSTERECTOMY  07/11/2012   Procedure: LAPAROSCOPIC SUPRACERVICAL HYSTERECTOMY;  Surgeon: Jonnie Kind, MD;  Location: [facility];  Service: Gynecology;  Laterality: N/A;  . LASIK    . NASAL SEPTUM SURGERY    . POLYPECTOMY  12/03/2011   Procedure: POLYPECTOMY;  Surgeon: Jonnie Kind, MD;  Location: [facility];  Service: Gynecology;  Laterality: N/A;  resection of polyp  . TOTAL KNEE ARTHROPLASTY Left 09/06/2016  . TOTAL KNEE ARTHROPLASTY Left 09/06/2016   Procedure: TOTAL KNEE ARTHROPLASTY;  Surgeon: Elsie Saas, MD;  Location: [place];  Service: Orthopedics;  Laterality: Left;  . TRIGGER FINGER RELEASE  12/2016  . VEIN SURGERY     Varicose veins     Social History:  The patient  reports that she has never smoked. She has never used smokeless tobacco. She reports that she drinks about 0.6 oz of alcohol per week. She reports that she does not use drugs.   Family History:  The patient's family history includes Asthma in her mother; Breast cancer in her cousin; Breast cancer (age of onset: 54) in her sister; Cancer in her sister; Colon cancer in her maternal uncle; Heart disease in her father; Hypertension in her father and mother; Stroke in her mother; Thyroid disease in her sister.    ROS:  Please see the history of present illness. All other systems are reviewed and  Negative to the above problem except as noted.    PHYSICAL EXAM: VS:  BP 130/84 (BP Location: Left Arm)   Pulse 85   Ht 5\' 7"  (1.702 m)    Wt 139 lb (63 kg)   LMP 11/25/2011   SpO2 99%   BMI 21.77 kg/m   YTK:PTWS 57 yo, in no acute distress  HEENT: normal  Neck: no JVD, carotid bruits, or masses Cardiac: RRR; no murmurs, rubs, or gallops,no edema  Respiratory:  clear to auscultation bilaterally, normal work of breathing GI: soft, nontender, nondistended, + BS  No hepatomegaly  MS: no deformity Moving all extremities   Skin: warm and dry, no rash  Tan Neuro:  Strength and sensation are intact Psych: euthymic mood, full affect   EKG:  EKG is ordered today.  SR 79 bpm     Lipid Panel    Component Value Date/Time   CHOL 184 02/28/2014 1208   TRIG 62 02/28/2014 1208   HDL 64 02/28/2014 1208   CHOLHDL 2.9 02/28/2014 1208   VLDL 12 02/28/2014 1208   LDLCALC 108 (H) 02/28/2014 1208      Wt Readings from Last 3 Encounters:  11/25/17 139 lb (63 kg)  07/06/17 126 lb 9.6 oz (57.4 kg)  05/27/17 131 lb (59.4 kg)      ASSESSMENT AND PLAN:  Pt is a 57 yo with no history of CAD   Had atherosclerosis noted in breast vessels She has not symptoms to sugg angina She is s/p TAH/BSO   PT has strong FHx of vascluar dz   LDL on check was 136  HDL 88    Given above I would recomm coronary CT to assess coronary calcifications.     2.  HL   Rx depends onf findings of CT   Watch diet    F/U is based on test results    Current medicines are reviewed at length with the patient today.  The patient does not have concerns regarding medicines.  Signed, Dorris Carnes, MD  11/25/2017 2:08 PM    [facility] [address], [place], [place]  [address] Phone: [phone]; Fax: [phone]

## 2017-12-01 DIAGNOSIS — J301 Allergic rhinitis due to pollen: Secondary | ICD-10-CM | POA: Diagnosis not present

## 2017-12-16 DIAGNOSIS — I87323 Chronic venous hypertension (idiopathic) with inflammation of bilateral lower extremity: Secondary | ICD-10-CM | POA: Diagnosis not present

## 2017-12-23 DIAGNOSIS — I8312 Varicose veins of left lower extremity with inflammation: Secondary | ICD-10-CM | POA: Diagnosis not present

## 2017-12-23 DIAGNOSIS — I8311 Varicose veins of right lower extremity with inflammation: Secondary | ICD-10-CM | POA: Diagnosis not present

## 2017-12-30 DIAGNOSIS — G500 Trigeminal neuralgia: Secondary | ICD-10-CM | POA: Diagnosis not present

## 2017-12-30 DIAGNOSIS — G47419 Narcolepsy without cataplexy: Secondary | ICD-10-CM | POA: Diagnosis not present

## 2018-01-05 ENCOUNTER — Ambulatory Visit: Payer: 59 | Admitting: Adult Health

## 2018-01-05 DIAGNOSIS — I10 Essential (primary) hypertension: Secondary | ICD-10-CM | POA: Diagnosis not present

## 2018-01-05 DIAGNOSIS — E782 Mixed hyperlipidemia: Secondary | ICD-10-CM | POA: Diagnosis not present

## 2018-01-05 DIAGNOSIS — E039 Hypothyroidism, unspecified: Secondary | ICD-10-CM | POA: Diagnosis not present

## 2018-01-20 DIAGNOSIS — I87323 Chronic venous hypertension (idiopathic) with inflammation of bilateral lower extremity: Secondary | ICD-10-CM | POA: Diagnosis not present

## 2018-02-03 DIAGNOSIS — G47419 Narcolepsy without cataplexy: Secondary | ICD-10-CM | POA: Diagnosis not present

## 2018-02-03 DIAGNOSIS — G500 Trigeminal neuralgia: Secondary | ICD-10-CM | POA: Diagnosis not present

## 2018-02-03 DIAGNOSIS — E039 Hypothyroidism, unspecified: Secondary | ICD-10-CM | POA: Diagnosis not present

## 2018-02-12 IMAGING — DX DG SINUSES COMPLETE 3+V
5 series · 5 of 5 positions shown · non-contrast
Comparison: None.

CLINICAL DATA: Left maxillary sinus and cheek pain. Fall 1 year
ago. Sinus congestion.

EXAM:
PARANASAL SINUSES - COMPLETE 3 + VIEW

[skull calldwell]
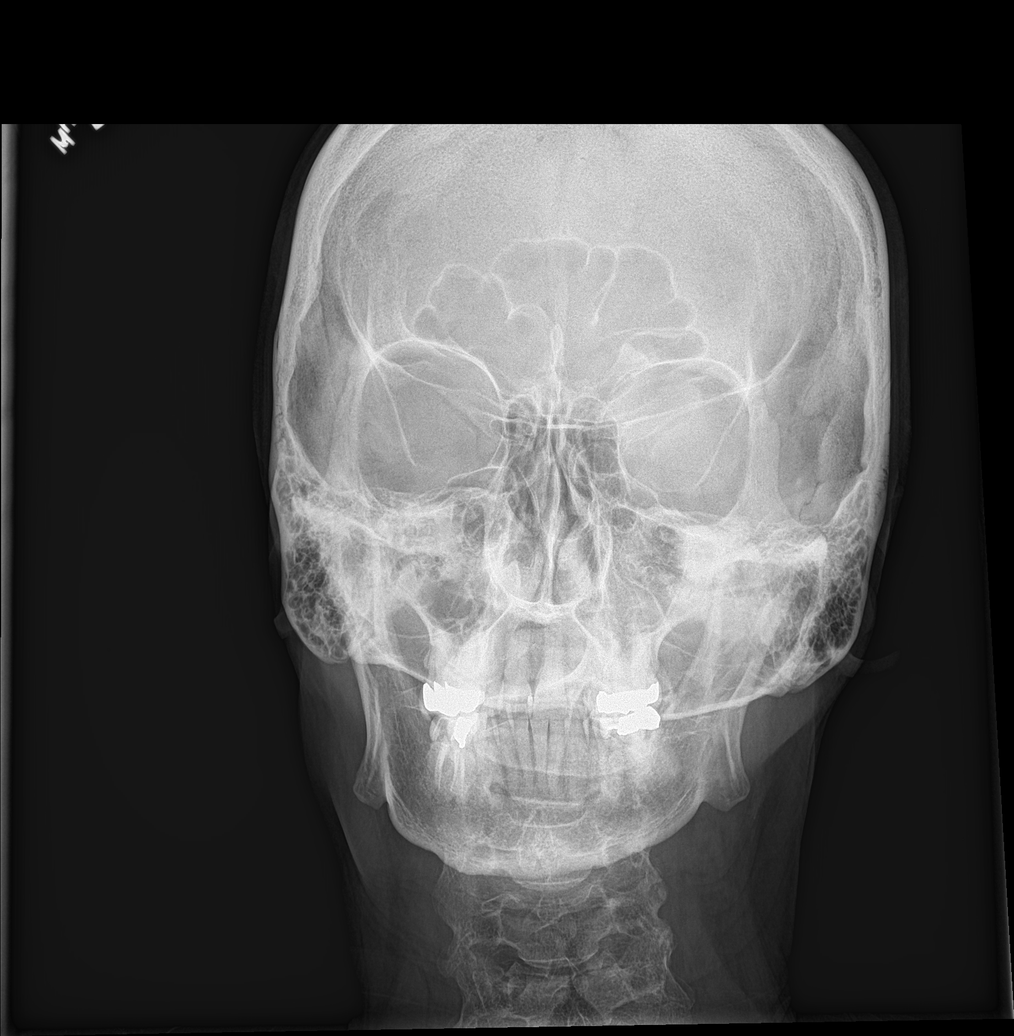

[skull waters]
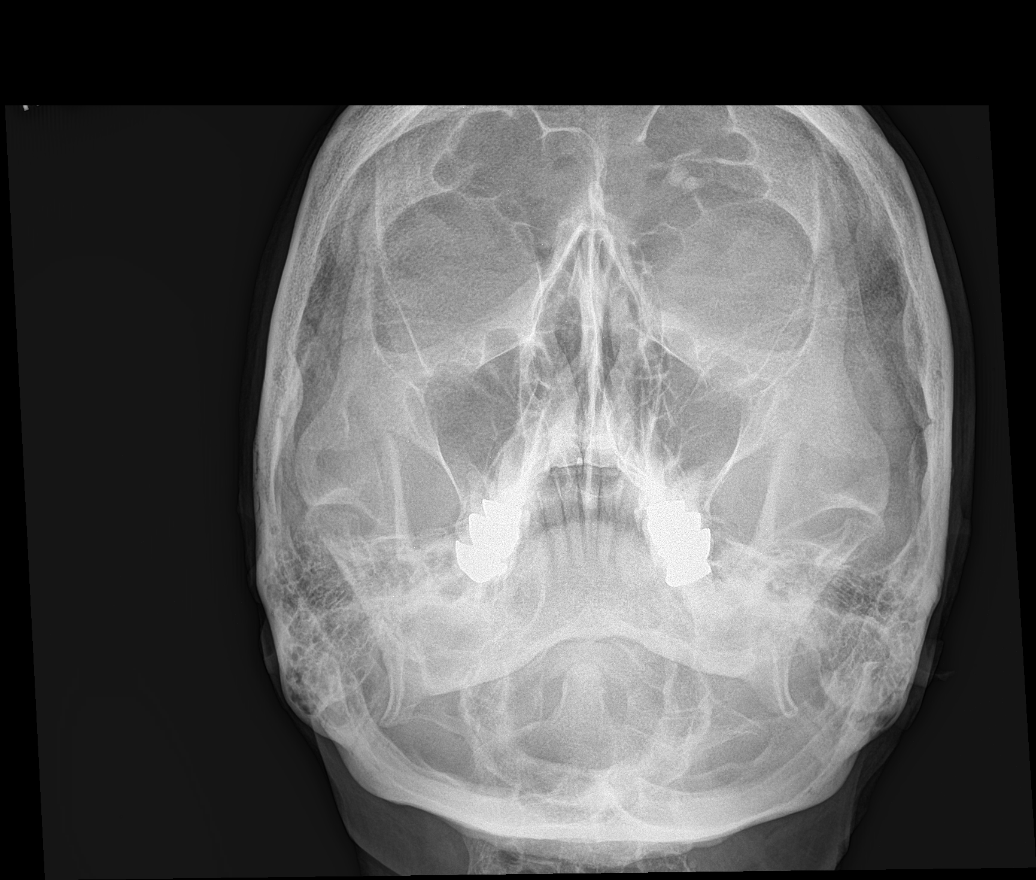

[skull smv (1 of 2)]
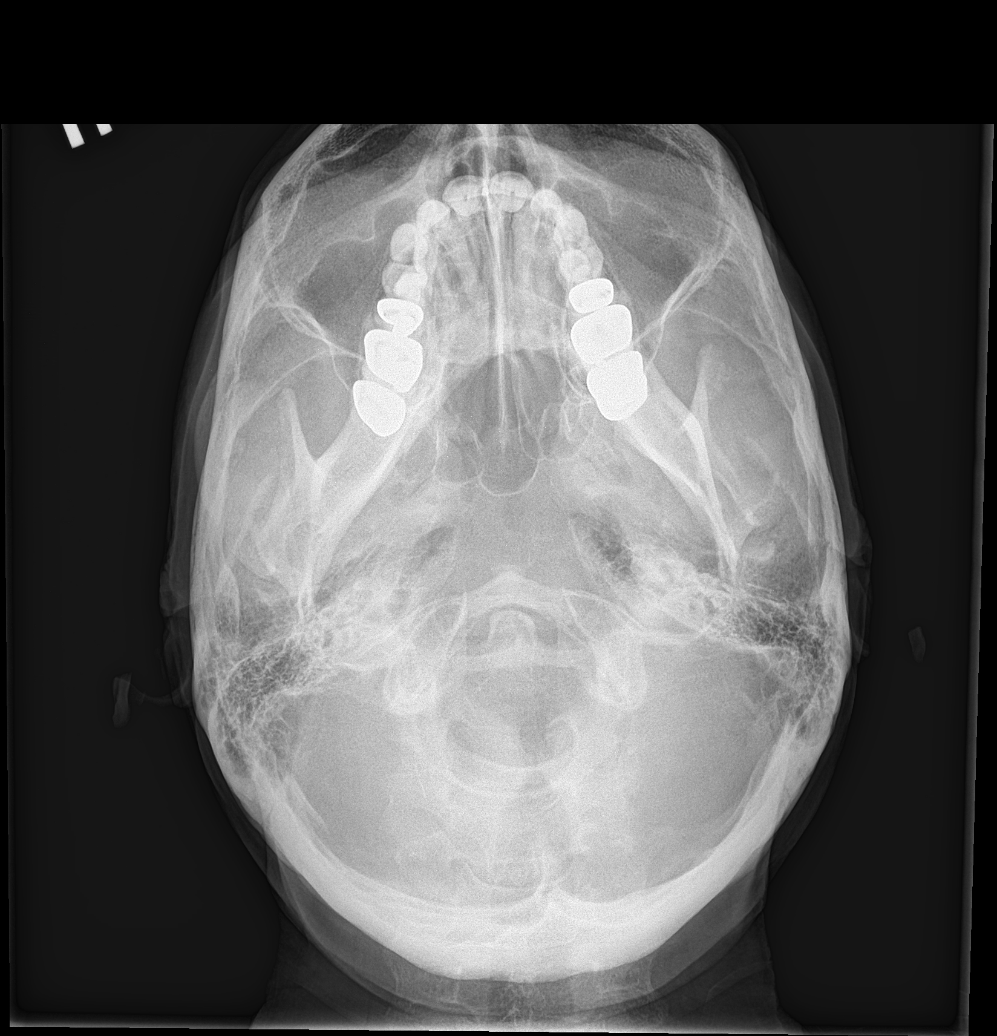

[skull lat]
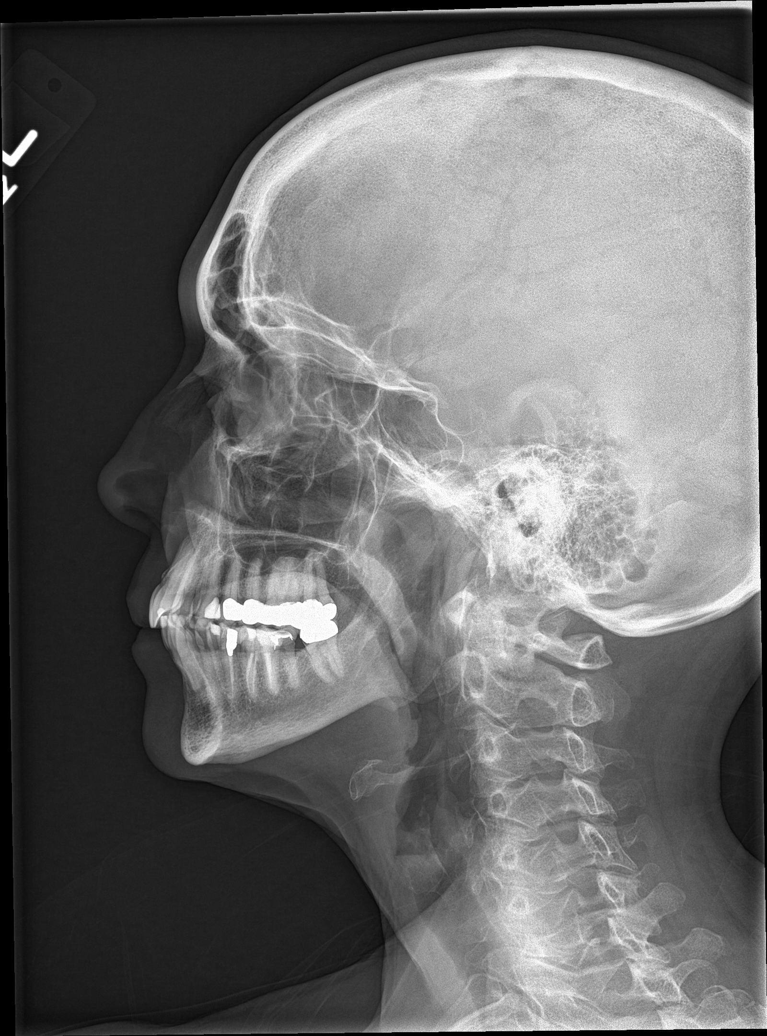

[skull smv (2 of 2)]
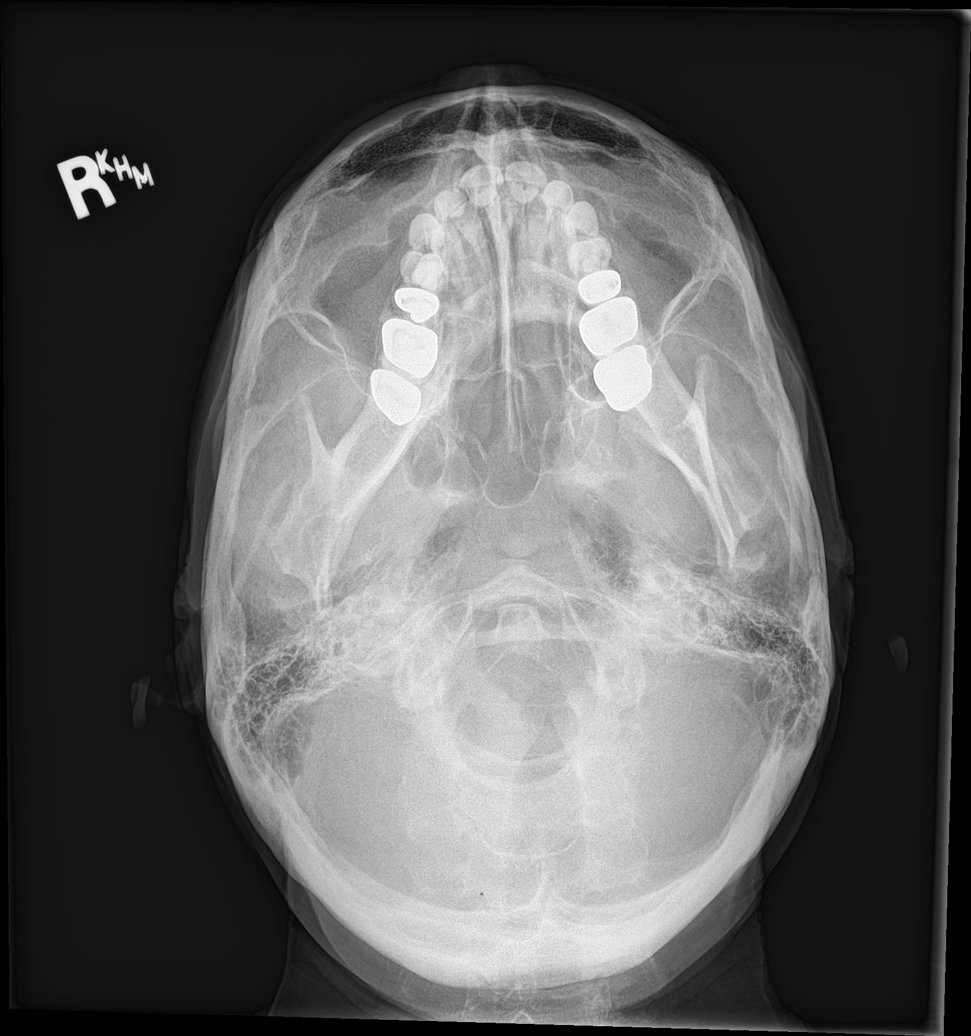

[5 of 5 positions shown; findings below may reference images not displayed]

FINDINGS: The paranasal sinus are aerated. There is no evidence of sinus
opacification air-fluid levels or mucosal thickening. No significant
bone abnormalities are seen.
IMPRESSION: Negative.

## 2018-02-12 IMAGING — DX DG FACIAL BONES COMPLETE 3+V
4 series · 4 of 4 positions shown · non-contrast
Comparison: Paranasal sinus radiographs - earlier same day

CLINICAL DATA: Left maxillary sinus and cheek pain following fall
approximately 1 year ago.

EXAM:
FACIAL BONES COMPLETE 3+V

[facial waters]
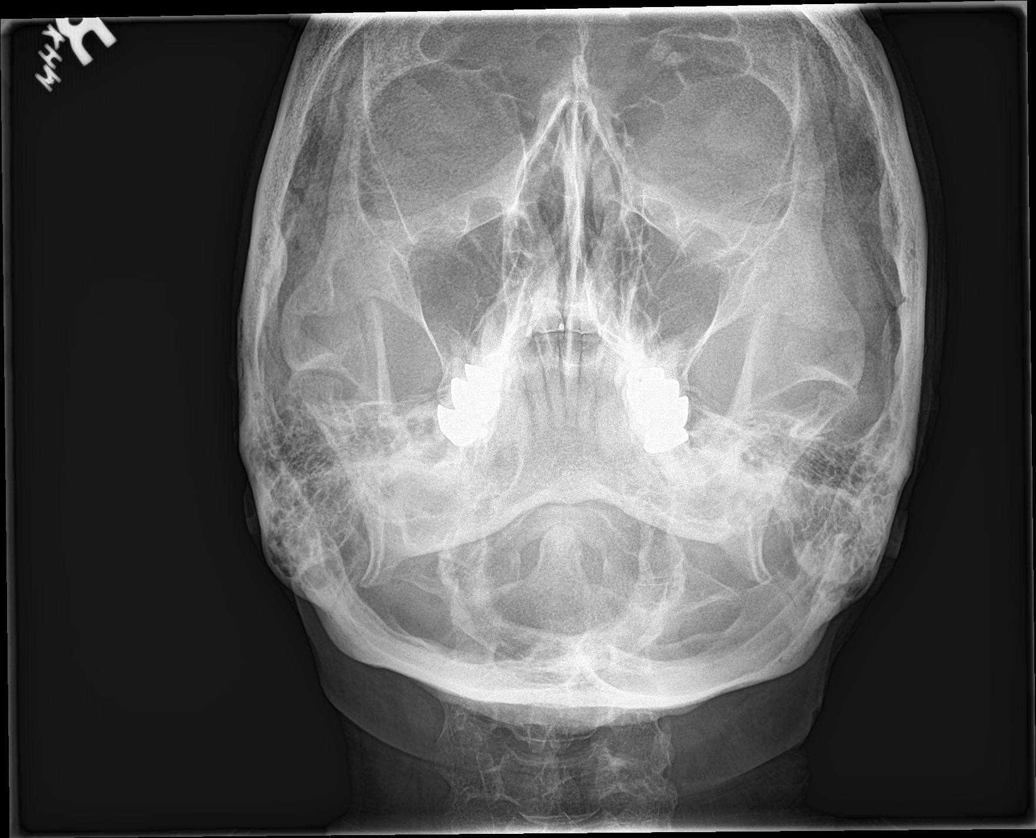

[facial lat]
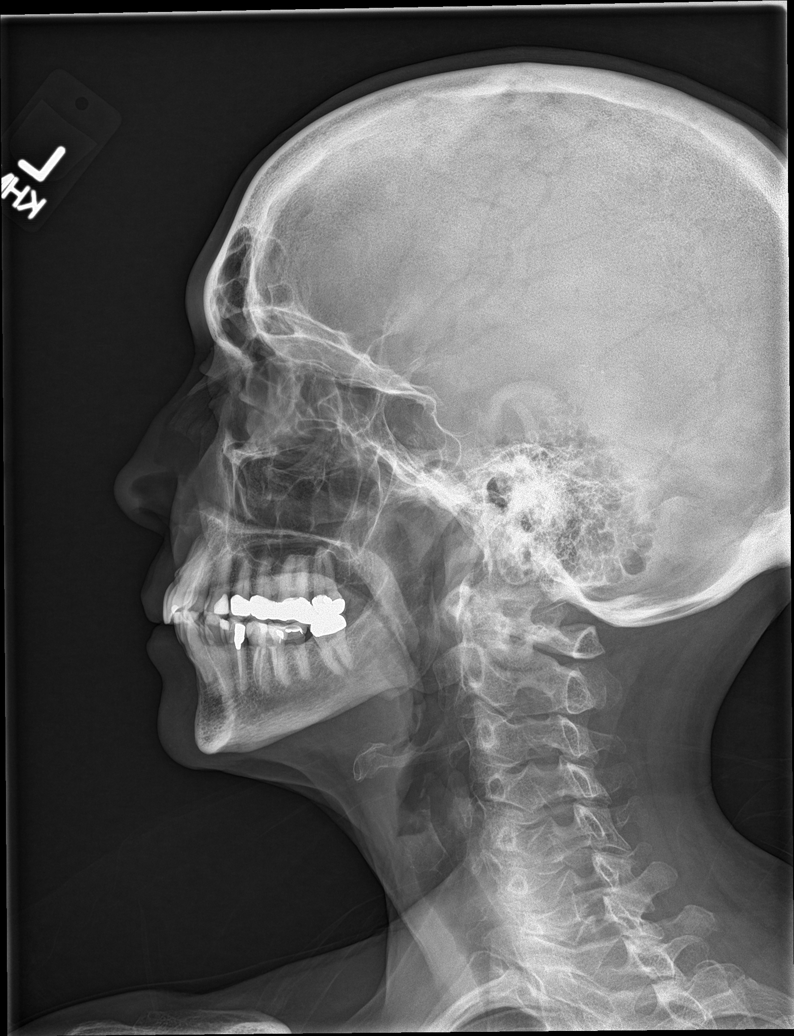

[orbits pa]
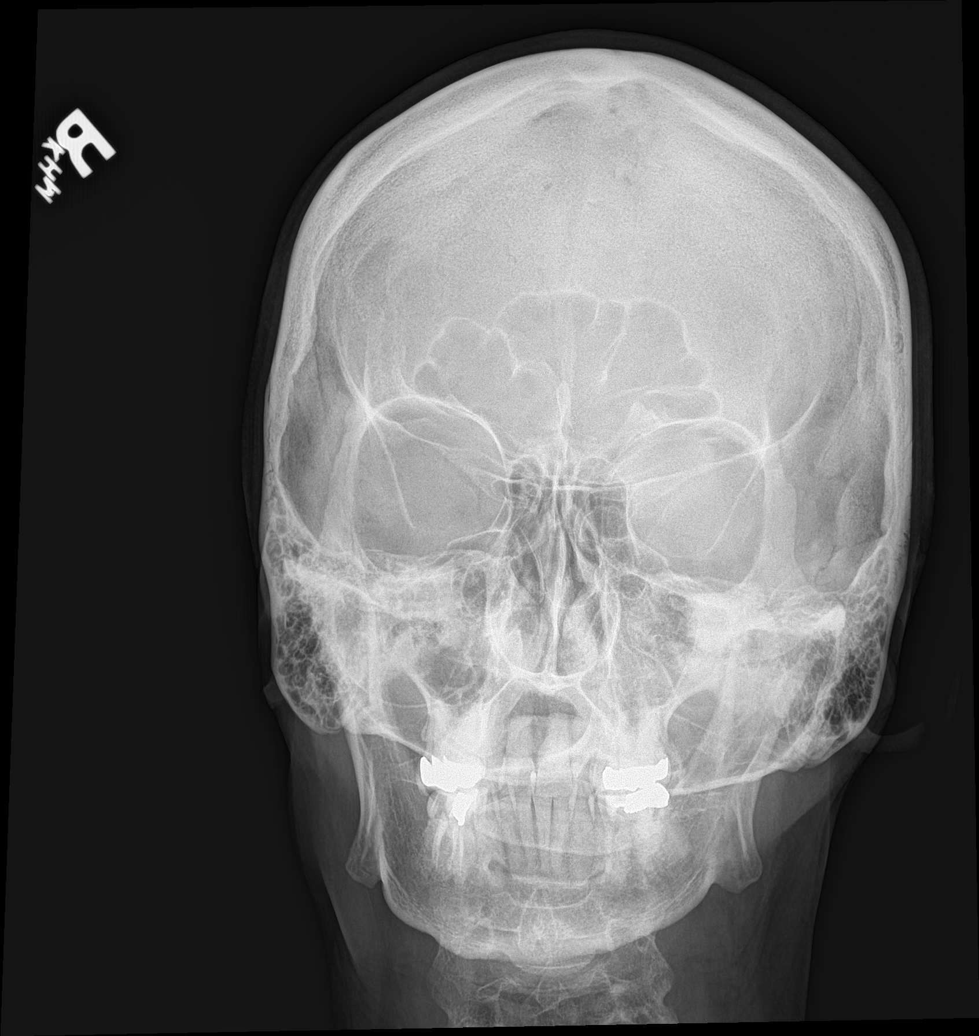

[skull calldwell]
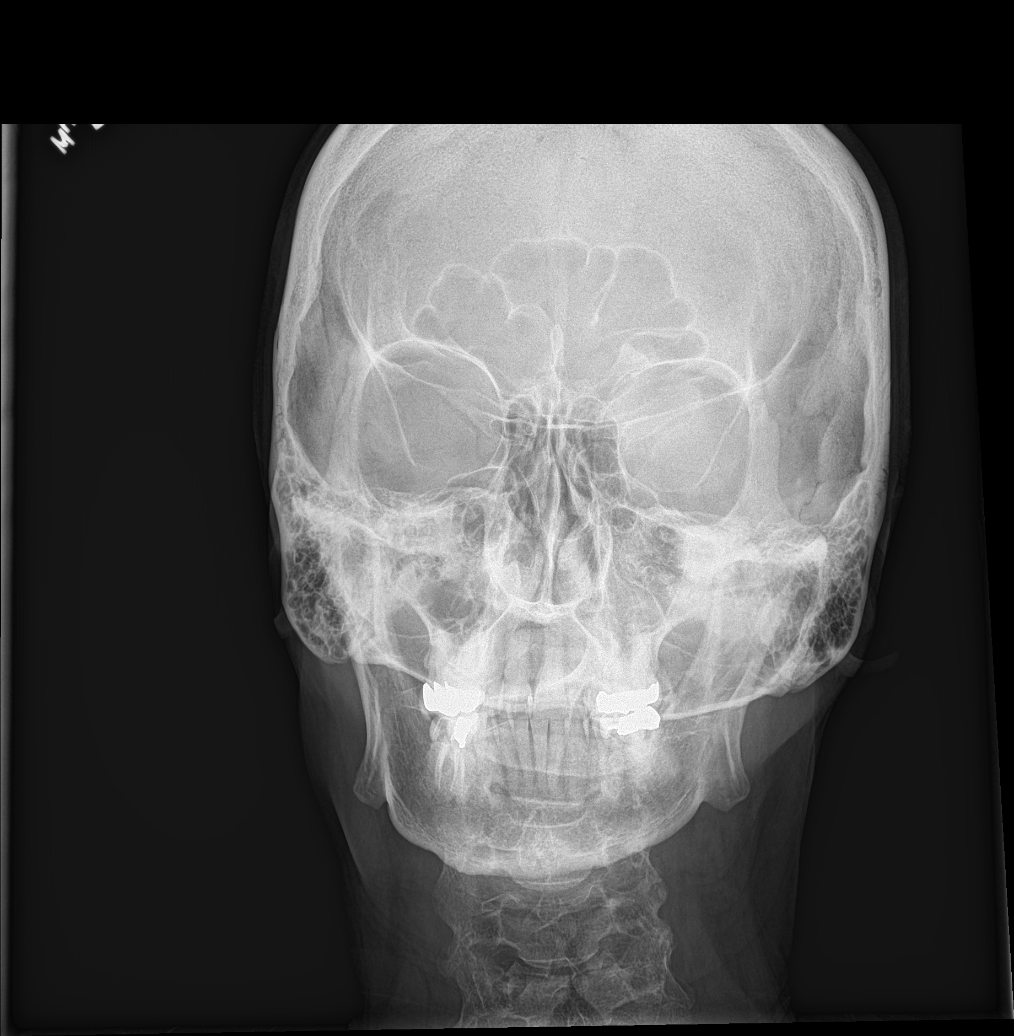

[4 of 4 positions shown; findings below may reference images not displayed]

FINDINGS: No definite displaced facial fracture. No significant nasal septal
deviation. Paranasal sinuses and mastoid air cells appear normally
aerated. No air-fluid levels. Regional soft tissues appear normal.
No radiopaque foreign body.
IMPRESSION: No definite displaced facial fracture. Further evaluation with
maxillofacial CT could be performed as indicated.

## 2018-07-07 NOTE — Progress Notes (Signed)
REVIEWED-NO ADDITIONAL RECOMMENDATIONS. 

## 2018-07-12 ENCOUNTER — Encounter

## 2018-07-12 ENCOUNTER — Ambulatory Visit: Payer: 59 | Admitting: Adult Health

## 2018-08-04 DIAGNOSIS — E782 Mixed hyperlipidemia: Secondary | ICD-10-CM | POA: Diagnosis not present

## 2018-08-04 DIAGNOSIS — E039 Hypothyroidism, unspecified: Secondary | ICD-10-CM | POA: Diagnosis not present

## 2018-08-11 DIAGNOSIS — E782 Mixed hyperlipidemia: Secondary | ICD-10-CM | POA: Diagnosis not present

## 2018-08-11 DIAGNOSIS — G500 Trigeminal neuralgia: Secondary | ICD-10-CM | POA: Diagnosis not present

## 2018-08-11 DIAGNOSIS — E039 Hypothyroidism, unspecified: Secondary | ICD-10-CM | POA: Diagnosis not present

## 2018-08-17 ENCOUNTER — Other Ambulatory Visit (HOSPITAL_COMMUNITY): Payer: Self-pay | Admitting: Internal Medicine

## 2018-08-17 DIAGNOSIS — Z780 Asymptomatic menopausal state: Secondary | ICD-10-CM

## 2018-08-22 DIAGNOSIS — Z1212 Encounter for screening for malignant neoplasm of rectum: Secondary | ICD-10-CM | POA: Diagnosis not present

## 2018-08-22 DIAGNOSIS — Z1211 Encounter for screening for malignant neoplasm of colon: Secondary | ICD-10-CM | POA: Diagnosis not present

## 2018-11-06 ENCOUNTER — Other Ambulatory Visit: Payer: Self-pay | Admitting: Internal Medicine

## 2018-11-06 DIAGNOSIS — Z1231 Encounter for screening mammogram for malignant neoplasm of breast: Secondary | ICD-10-CM

## 2018-12-06 ENCOUNTER — Ambulatory Visit: Payer: 59

## 2018-12-19 ENCOUNTER — Other Ambulatory Visit: Payer: Self-pay | Admitting: Internal Medicine

## 2018-12-19 DIAGNOSIS — E2839 Other primary ovarian failure: Secondary | ICD-10-CM

## 2018-12-19 DIAGNOSIS — Z780 Asymptomatic menopausal state: Secondary | ICD-10-CM

## 2019-03-30 ENCOUNTER — Other Ambulatory Visit: Payer: Self-pay

## 2019-03-30 ENCOUNTER — Ambulatory Visit
Admission: RE | Admit: 2019-03-30 | Discharge: 2019-03-30 | Disposition: A | Discharge status: Home / Self Care | Payer: BC Managed Care – PPO | Source: Ambulatory Visit | Attending: Internal Medicine | Admitting: Internal Medicine

## 2019-03-30 ENCOUNTER — Other Ambulatory Visit: Payer: 59

## 2019-03-30 DIAGNOSIS — E2839 Other primary ovarian failure: Secondary | ICD-10-CM

## 2019-06-21 ENCOUNTER — Encounter: Payer: Self-pay | Admitting: Podiatry

## 2019-06-21 ENCOUNTER — Ambulatory Visit (INDEPENDENT_AMBULATORY_CARE_PROVIDER_SITE_OTHER): Payer: BC Managed Care – PPO

## 2019-06-21 ENCOUNTER — Ambulatory Visit: Payer: BC Managed Care – PPO | Admitting: Podiatry

## 2019-06-21 ENCOUNTER — Other Ambulatory Visit: Payer: Self-pay

## 2019-06-21 DIAGNOSIS — M722 Plantar fascial fibromatosis: Secondary | ICD-10-CM

## 2019-06-21 DIAGNOSIS — L600 Ingrowing nail: Secondary | ICD-10-CM | POA: Diagnosis not present

## 2019-06-21 DIAGNOSIS — M79671 Pain in right foot: Secondary | ICD-10-CM

## 2019-06-21 MED ORDER — NEOMYCIN-POLYMYXIN-HC 3.5-10000-1 OT SOLN: OTIC | 1 refills | Status: DC

## 2019-06-21 NOTE — Patient Instructions (Signed)

## 2019-06-22 ENCOUNTER — Other Ambulatory Visit: Payer: Self-pay | Admitting: Podiatry

## 2019-06-22 DIAGNOSIS — M722 Plantar fascial fibromatosis: Secondary | ICD-10-CM

## 2019-06-27 NOTE — Progress Notes (Signed)
Subjective:   Patient ID: Yvette Joyce, female   DOB: 58 y.o.   MRN: [id number]   HPI Patient states she is developed a lot of pain in the mid arch area right and also has developed a chronic ingrown toenail of her left hallux medial border that she is tried to soak and trim without relief and she would like corrected.  No other changes health history   ROS      Objective:  Physical Exam  Neurovascular status intact with patient noted to have an incurvated left hallux medial border that is painful when pressed with inability to wear shoe gear comfortably and is noted to have inflammation pain of the mid arch area right with fluid buildup     Assessment:  Chronic ingrown toenail deformity left hallux medial border along with mid arch fasciitis right     Plan:  H&P conditions reviewed and at this point I am focusing on both problems.  For the right I went ahead did sterile prep and injected the mid arch 3 mg Dexasone Kenalog 5 mg Xylocaine and advised on reduced activity supportive shoes and stretching.  For the left recommended correction and allowed her to read consent form going over correction and went ahead today and after she side I infiltrated 60 mg like Marcaine mixture sterile prep applied and using sterile instrumentation I remove the border exposed matrix and applied phenol 3 applications 30 seconds followed by alcohol lavage sterile dressing.  Give instructions on soaks and reappoint and encouraged her to leave dressing on 24 hours but take it off earlier if any issues were to occur.  Encouraged to call with questions or concerns she may have  X-ray indicates no indications of stress fracture arthritis with mild depression of the arch noted

## 2019-10-18 ENCOUNTER — Ambulatory Visit: Payer: BC Managed Care – PPO | Attending: Internal Medicine

## 2019-10-18 DIAGNOSIS — Z23 Encounter for immunization: Secondary | ICD-10-CM | POA: Insufficient documentation

## 2019-10-18 NOTE — Progress Notes (Signed)
   Covid-19 Vaccination Clinic  Name:  Yvette Joyce    MRN: [id number] DOB: Oct 14, 1960  10/18/2019  Yvette Joyce was observed post Covid-19 immunization for 15 minutes without incident. She was provided with Vaccine Information Sheet and instruction to access the V-Safe system.   Yvette Joyce was instructed to call 911 with any severe reactions post vaccine: Marland Kitchen Difficulty breathing  . Swelling of face and throat  . A fast heartbeat  . A bad rash all over body  . Dizziness and weakness   Immunizations Administered    Name Date Dose VIS Date Route   Moderna COVID-19 Vaccine 10/18/2019  8:54 AM 0.5 mL 07/17/2019 Intramuscular   Manufacturer: Moderna   Lot: [id number]   [id number]

## 2019-11-07 ENCOUNTER — Other Ambulatory Visit: Payer: Self-pay

## 2019-11-07 ENCOUNTER — Ambulatory Visit: Payer: BC Managed Care – PPO | Admitting: Podiatry

## 2019-11-07 ENCOUNTER — Encounter: Payer: Self-pay | Admitting: Podiatry

## 2019-11-07 VITALS — Temp 97.8°F

## 2019-11-07 DIAGNOSIS — M722 Plantar fascial fibromatosis: Secondary | ICD-10-CM | POA: Diagnosis not present

## 2019-11-07 DIAGNOSIS — L600 Ingrowing nail: Secondary | ICD-10-CM

## 2019-11-07 NOTE — Patient Instructions (Signed)

## 2019-11-07 NOTE — Progress Notes (Signed)
Subjective:   Patient ID: Yvette Joyce, female   DOB: 59 y.o.   MRN: [id number]   HPI Patient presents stating that big toenail is improving but it feels a little bit numb on the side and I just wanted it checked and its got a double ridge to it and I was concerned   ROS      Objective:  Physical Exam  Neurovascular status intact with patient's right hallux appearing traumatized after having changes over the last month with some numbness on the inside with no indications of pathology     Assessment:  Appears to be more due to strictly the structure of the nailbed without any indication of acute ingrown toenail     Plan:  H&P reviewed condition recommended trimming it which I did today which made it feel better and we will watch it and consider other treatments if necessary signed this

## 2019-11-21 ENCOUNTER — Ambulatory Visit: Payer: BC Managed Care – PPO | Attending: Internal Medicine

## 2019-11-21 DIAGNOSIS — Z23 Encounter for immunization: Secondary | ICD-10-CM

## 2019-11-21 NOTE — Progress Notes (Signed)
   Covid-19 Vaccination Clinic  Name:  Alysan Herrell    MRN: [id number] DOB: 1961/06/22  11/21/2019  Ms. Reichenberger was observed post Covid-19 immunization for 15 minutes without incident. She was provided with Vaccine Information Sheet and instruction to access the V-Safe system.   Ms. Lou was instructed to call 911 with any severe reactions post vaccine: Marland Kitchen Difficulty breathing  . Swelling of face and throat  . A fast heartbeat  . A bad rash all over body  . Dizziness and weakness   Immunizations Administered    Name Date Dose VIS Date Route   Moderna COVID-19 Vaccine 11/21/2019  8:33 AM 0.5 mL 07/17/2019 Intramuscular   Manufacturer: Levan Hurst   LotUD:[id number]   RandolphBE:[id number]

## 2020-03-03 ENCOUNTER — Other Ambulatory Visit: Payer: Self-pay | Admitting: Internal Medicine

## 2020-03-03 DIAGNOSIS — Z1231 Encounter for screening mammogram for malignant neoplasm of breast: Secondary | ICD-10-CM

## 2020-03-06 ENCOUNTER — Other Ambulatory Visit (HOSPITAL_COMMUNITY): Payer: Self-pay | Admitting: Internal Medicine

## 2020-03-06 DIAGNOSIS — Z8262 Family history of osteoporosis: Secondary | ICD-10-CM

## 2020-04-02 ENCOUNTER — Other Ambulatory Visit: Payer: Self-pay

## 2020-04-02 ENCOUNTER — Ambulatory Visit
Admission: RE | Admit: 2020-04-02 | Discharge: 2020-04-02 | Disposition: A | Discharge status: Home / Self Care | Payer: BC Managed Care – PPO | Source: Ambulatory Visit | Attending: Internal Medicine | Admitting: Internal Medicine

## 2020-04-02 DIAGNOSIS — Z1231 Encounter for screening mammogram for malignant neoplasm of breast: Secondary | ICD-10-CM

## 2020-07-14 ENCOUNTER — Telehealth: Payer: Self-pay

## 2020-07-14 NOTE — Telephone Encounter (Signed)
Left Pt detailed voice mail asking for a return call [id number] an appt/rval

## 2020-10-02 ENCOUNTER — Encounter: Payer: BC Managed Care – PPO | Admitting: Vascular Surgery

## 2020-10-02 ENCOUNTER — Encounter (HOSPITAL_COMMUNITY): Payer: BC Managed Care – PPO

## 2020-10-24 ENCOUNTER — Other Ambulatory Visit: Payer: Self-pay | Admitting: *Deleted

## 2020-10-24 DIAGNOSIS — I87309 Chronic venous hypertension (idiopathic) without complications of unspecified lower extremity: Secondary | ICD-10-CM

## 2020-10-30 ENCOUNTER — Other Ambulatory Visit: Payer: Self-pay

## 2020-10-30 ENCOUNTER — Ambulatory Visit (HOSPITAL_COMMUNITY)
Admission: RE | Admit: 2020-10-30 | Discharge: 2020-10-30 | Disposition: A | Discharge status: Home / Self Care | Payer: BC Managed Care – PPO | Source: Ambulatory Visit | Attending: Vascular Surgery | Admitting: Vascular Surgery

## 2020-10-30 ENCOUNTER — Ambulatory Visit: Payer: BC Managed Care – PPO | Admitting: Vascular Surgery

## 2020-10-30 ENCOUNTER — Encounter: Payer: Self-pay | Admitting: Vascular Surgery

## 2020-10-30 VITALS — BP 144/83 | HR 88 | Temp 97.9°F | Resp 16 | Ht 66.0 in | Wt 137.0 lb

## 2020-10-30 DIAGNOSIS — I872 Venous insufficiency (chronic) (peripheral): Secondary | ICD-10-CM | POA: Diagnosis not present

## 2020-10-30 DIAGNOSIS — I87309 Chronic venous hypertension (idiopathic) without complications of unspecified lower extremity: Secondary | ICD-10-CM | POA: Diagnosis not present

## 2020-10-30 DIAGNOSIS — I8393 Asymptomatic varicose veins of bilateral lower extremities: Secondary | ICD-10-CM

## 2020-10-30 NOTE — Progress Notes (Signed)
REASON FOR CONSULT:    Chronic venous insufficiency.  The consult is requested by Dr. Elza Rafter.  ASSESSMENT & PLAN:   CHRONIC VENOUS INSUFFICIENCY: This patient has a deep venous reflux bilaterally.  She does not have any significant superficial venous reflux.  Her symptoms are consistent with symptoms related to venous hypertension.  We have discussed the importance of intermittent leg elevation and the proper positioning for this.  In addition we have fitted her for some knee-high compression stockings with a gradient of 15 to 20 mmHg.  I have encouraged her to avoid prolonged sitting and standing.  We discussed the importance of exercise specifically walking and water aerobics.  I reassured her that she has No need for any further laser procedures.  I also reassured her if she had pulses and no evidence of arterial insufficiency.  I offered to see her as needed however she would prefer to be seen yearly.  I will schedule her for a follow-up visit in 1 year with follow-up venous testing at that time.   Deitra Mayo, MD Office: [phone]   HPI:   Yvette Joyce is a pleasant 60 y.o. female, with a long history of chronic venous disease.  She describes aching pain and heaviness in both legs which is aggravated by standing and is worse at the end of the day.  Her job requires her to stand for 10 hours a day.  She has had previous laser ablation of both great saphenous veins and both small saphenous veins.  Her symptoms did improve after this but then subsequently returned.  She does wear compression stockings which do help her symptoms.  She has not been elevating her legs much as she is very busy.  She does have a family history of varicose veins.  She has undergone a previous left total knee replacement.  Past Medical History:  Diagnosis Date  . Adult attention deficit disorder   . Arthritis   . Complication of anesthesia    "hard to wake up"  . Constipation   .  Fatigue 03/07/2015  . Headache(784.0)   . Hematuria 04/12/2013  . Hypothyroidism   . Postmenopause 03/07/2015  . Primary localized osteoarthritis of left knee 08/26/2016  . Sleep disturbance 03/07/2015  . Snoring   . Thyroid disease   . Trigeminal neuralgia pain     Family History  Problem Relation Age of Onset  . Colon cancer Maternal Uncle   . Cancer Sister   . Thyroid disease Sister   . Breast cancer Sister 44       Twin Sister  . Heart disease Father   . Hypertension Father   . Stroke Mother   . Asthma Mother   . Hypertension Mother   . Breast cancer Cousin     SOCIAL HISTORY: Social History   Socioeconomic History  . Marital status: Divorced    Spouse name: Not on file  . Number of children: 0  . Years of education: College  . Highest education level: Not on file  Occupational History  . Not on file  Tobacco Use  . Smoking status: Never Smoker  . Smokeless tobacco: Never Used  Vaping Use  . Vaping Use: Never used  Substance and Sexual Activity  . Alcohol use: Yes    Alcohol/week: 1.0 standard drink    Types: 1 Shots of liquor per week    Comment: 1-2 times a week  . Drug use: No  . Sexual activity: Yes  Birth control/protection: Surgical    Comment: supracervical hyst  Other Topics Concern  . Not on file  Social History Narrative   Drinks about 5- 5 Hour [employer] a week    Social Determinants of Radio broadcast assistant Strain: Not on file  Food Insecurity: Not on file  Transportation Needs: Not on file  Physical Activity: Not on file  Stress: Not on file  Social Connections: Not on file  Intimate Partner Violence: Not on file    No Known Allergies  Current Outpatient Medications  Medication Sig Dispense Refill  . amphetamine-dextroamphetamine (ADDERALL) 20 MG tablet Take 20 mg by mouth 2 (two) times daily.     . carbamazepine (TEGRETOL) 100 MG chewable tablet Chew 1.5 tablets (150 mg total) by mouth 2 (two) times daily. 90 tablet 5  .  Cholecalciferol (VITAMIN D3) 25 MCG (1000 UT) CAPS Take by mouth.    . gabapentin (NEURONTIN) 300 MG capsule Take 300 mg by mouth as needed.     Marland Kitchen ibuprofen (ADVIL,MOTRIN) 800 MG tablet Take 1 tablet (800 mg total) by mouth every 8 (eight) hours as needed. 60 tablet 1  . levothyroxine (SYNTHROID, LEVOTHROID) 100 MCG tablet TAKE 1 TABLET BY MOUTH DAILY BEFORE BREAKFAST. 30 tablet 2  . nabumetone (RELAFEN) 750 MG tablet      No current facility-administered medications for this visit.    REVIEW OF SYSTEMS:  [X]  denotes positive finding, [ ]  denotes negative finding Cardiac  Comments:  Chest pain or chest pressure:    Shortness of breath upon exertion:    Short of breath when lying flat:    Irregular heart rhythm:        Vascular    Pain in calf, thigh, or hip brought on by ambulation:    Pain in feet at night that wakes you up from your sleep:     Blood clot in your veins:    Leg swelling:         Pulmonary    Oxygen at home:    Productive cough:     Wheezing:         Neurologic    Sudden weakness in arms or legs:     Sudden numbness in arms or legs:     Sudden onset of difficulty speaking or slurred speech:    Temporary loss of vision in one eye:     Problems with dizziness:         Gastrointestinal    Blood in stool:     Vomited blood:         Genitourinary    Burning when urinating:     Blood in urine:        Psychiatric    Major depression:         Hematologic    Bleeding problems:    Problems with blood clotting too easily:        Skin    Rashes or ulcers:        Constitutional    Fever or chills:     PHYSICAL EXAM:   Vitals:   10/30/20 1341  Resp: 16  Weight: 62.1 kg  Height: 5\' 6"  (1.676 m)    GENERAL: The patient is a well-nourished female, in no acute distress. The vital signs are documented above. CARDIAC: There is a regular rate and rhythm.  VASCULAR: I do not detect carotid bruits. She has palpable dorsalis pedis and posterior tibial  pulses bilaterally. She has some small telangiectasias  and spider veins bilaterally but no significant enlarged varicose veins.  She has no hyperpigmentation.  She has no lower extremity swelling. PULMONARY: There is good air exchange bilaterally without wheezing or rales. ABDOMEN: Soft and non-tender with normal pitched bowel sounds.  MUSCULOSKELETAL: There are no major deformities or cyanosis. NEUROLOGIC: No focal weakness or paresthesias are detected. SKIN: There are no ulcers or rashes noted. PSYCHIATRIC: The patient has a normal affect.  DATA:    VENOUS DUPLEX: I have independently interpreted her venous duplex scan today.  On the right side, there is no evidence of DVT.  There is deep venous reflux involving the common femoral vein.  There is no significant superficial venous reflux.  The patient has had previous laser ablation of the right great saphenous vein and right small saphenous vein.  There is an anterior sensory saphenous vein but does not have reflux.  On the left side, there is no evidence of DVT.  There is deep venous reflux involving the common femoral vein.  There is no significant superficial venous reflux.  The left great saphenous vein and small saphenous vein had been previously ablated.  There is an anterior accessory saphenous vein which does not have reflux.

## 2020-11-10 ENCOUNTER — Other Ambulatory Visit (HOSPITAL_COMMUNITY): Payer: Self-pay | Admitting: Family Medicine

## 2020-11-10 ENCOUNTER — Other Ambulatory Visit: Payer: Self-pay | Admitting: Family Medicine

## 2020-11-10 DIAGNOSIS — R519 Headache, unspecified: Secondary | ICD-10-CM

## 2020-11-10 DIAGNOSIS — G500 Trigeminal neuralgia: Secondary | ICD-10-CM

## 2020-11-12 ENCOUNTER — Other Ambulatory Visit: Payer: Self-pay | Admitting: Internal Medicine

## 2020-11-12 DIAGNOSIS — R519 Headache, unspecified: Secondary | ICD-10-CM

## 2020-11-12 DIAGNOSIS — G500 Trigeminal neuralgia: Secondary | ICD-10-CM

## 2020-11-17 ENCOUNTER — Ambulatory Visit: Payer: BC Managed Care – PPO | Admitting: Neurology

## 2020-11-28 ENCOUNTER — Ambulatory Visit
Admission: RE | Admit: 2020-11-28 | Discharge: 2020-11-28 | Disposition: A | Discharge status: Home / Self Care | Payer: BC Managed Care – PPO | Source: Ambulatory Visit | Attending: Internal Medicine | Admitting: Internal Medicine

## 2020-11-28 ENCOUNTER — Other Ambulatory Visit: Payer: Self-pay

## 2020-11-28 DIAGNOSIS — R519 Headache, unspecified: Secondary | ICD-10-CM

## 2020-11-28 DIAGNOSIS — G500 Trigeminal neuralgia: Secondary | ICD-10-CM

## 2020-11-28 MED ORDER — GADOBENATE DIMEGLUMINE 529 MG/ML IV SOLN
12.0000 mL | Freq: Once | INTRAVENOUS | Status: AC | PRN
  Administered 2020-11-28: 12 mL via INTRAVENOUS

## 2020-12-01 ENCOUNTER — Other Ambulatory Visit (HOSPITAL_COMMUNITY): Payer: Self-pay | Admitting: Family Medicine

## 2020-12-01 DIAGNOSIS — R6884 Jaw pain: Secondary | ICD-10-CM

## 2020-12-01 DIAGNOSIS — M542 Cervicalgia: Secondary | ICD-10-CM

## 2020-12-02 ENCOUNTER — Ambulatory Visit (HOSPITAL_COMMUNITY)
Admission: RE | Admit: 2020-12-02 | Discharge: 2020-12-02 | Disposition: A | Discharge status: Home / Self Care | Payer: BC Managed Care – PPO | Source: Ambulatory Visit | Attending: Family Medicine | Admitting: Family Medicine

## 2020-12-02 DIAGNOSIS — M542 Cervicalgia: Secondary | ICD-10-CM | POA: Diagnosis present

## 2020-12-02 DIAGNOSIS — R6884 Jaw pain: Secondary | ICD-10-CM | POA: Diagnosis present

## 2020-12-06 ENCOUNTER — Other Ambulatory Visit: Payer: BC Managed Care – PPO

## 2020-12-10 ENCOUNTER — Ambulatory Visit: Payer: BC Managed Care – PPO | Admitting: Podiatry

## 2020-12-11 ENCOUNTER — Encounter: Payer: Self-pay | Admitting: Podiatry

## 2020-12-11 ENCOUNTER — Other Ambulatory Visit: Payer: Self-pay

## 2020-12-11 ENCOUNTER — Ambulatory Visit (INDEPENDENT_AMBULATORY_CARE_PROVIDER_SITE_OTHER): Payer: BC Managed Care – PPO

## 2020-12-11 ENCOUNTER — Ambulatory Visit: Payer: BC Managed Care – PPO | Admitting: Podiatry

## 2020-12-11 DIAGNOSIS — M2010 Hallux valgus (acquired), unspecified foot: Secondary | ICD-10-CM

## 2020-12-11 DIAGNOSIS — D169 Benign neoplasm of bone and articular cartilage, unspecified: Secondary | ICD-10-CM

## 2020-12-11 DIAGNOSIS — L84 Corns and callosities: Secondary | ICD-10-CM

## 2020-12-11 NOTE — Progress Notes (Signed)
Subjective:   Patient ID: Yvette Joyce, female   DOB: 60 y.o.   MRN: [id number]   HPI Patient presents stating she is got chronic lesions on the big toes of both feet second toe left and feels like she has structural abnormality and states that she gets soreness when she tries to wear shoe gear.  States it is become gradually more of an issue over time   ROS      Objective:  Physical Exam  Neuro vascular status intact with structural malposition of the hallux bilateral pressing against the second toe left over right with moderate distal rotation noted and possibility for exostotic lesion second toe left foot with keratotic tissue formation     Assessment:  Structural malalignment of the digits along with keratotic tissue formation that is relative to this condition 8     Plan:  NP reviewed condition x-rays debrided lesions applied padding discussed possibility for surgical intervention with possibility for rotational inner phalangeal joint procedures along with possible exostectomy.  Educated on possible correction  X-rays indicate there is moderate deviation of the hallux bilateral against the second toe left over right with no other significant pathology and previous arthroplasty digit 5 bilateral

## 2020-12-29 ENCOUNTER — Telehealth: Payer: Self-pay | Admitting: Neurology

## 2020-12-29 ENCOUNTER — Other Ambulatory Visit: Payer: Self-pay

## 2020-12-29 ENCOUNTER — Ambulatory Visit: Payer: BC Managed Care – PPO | Admitting: Neurology

## 2020-12-29 ENCOUNTER — Encounter: Payer: Self-pay | Admitting: Neurology

## 2020-12-29 VITALS — BP 131/75 | HR 77 | Ht 66.0 in | Wt 137.0 lb

## 2020-12-29 DIAGNOSIS — G500 Trigeminal neuralgia: Secondary | ICD-10-CM

## 2020-12-29 MED ORDER — CARBAMAZEPINE 100 MG PO CHEW: 200.0000 mg | CHEWABLE_TABLET | Freq: Two times a day (BID) | ORAL | 3 refills | Status: DC

## 2020-12-29 NOTE — Patient Instructions (Addendum)
It was good to see you again today.  I am sorry you had a flareup with your trigeminal neuralgia.  You can continue with carbamazepine 200 mg twice daily, I renewed your prescription.  We can consider consultation with a neurosurgeon to see if you are a candidate for gamma knife surgery.  If you would like to proceed with a consultation, I would be happy to make a referral to Dr. Arlan Organ at [facility].  Please follow-up routinely to see one of our nurse practitioners in the next 3 to 4 months.

## 2020-12-29 NOTE — Telephone Encounter (Signed)
Just received confirmation of fax going through 10 mins ago. All is set for blood tests.

## 2020-12-29 NOTE — Telephone Encounter (Signed)
Pt called, Dr. Vonzella Nipple office said they have not received anything about blood work from [employer].   Contact info:  Dr. Juel Burrow office [phone]

## 2020-12-29 NOTE — Progress Notes (Signed)
Subjective:    Patient ID: Yvette Joyce is a 60 y.o. female.  HPI     Star Age, MD, PhD [facility] [address] [place], [place] [address]  Dear Dr. Nevada Crane,  I saw your patient, Yvette Joyce, upon your kind request for evaluation of her left-sided facial pain.  The patient is unaccompanied today.  As you know, Yvette Joyce is a 60 year old right-handed woman with an underlying medical history of hypothyroidism, Planter fasciitis, carpal tunnel syndrome, arthritis, status post left total knee replacement in January 2018, who reports recurrent symptoms of left sided facial pain.  Symptoms started in February.  She had severe pain, saw her dentist, Dr. Jannifer Franklin who eventually gave her some local shots which helped for about 2 days.  She saw Dr. Tarri Glenn, oral surgeon and a hematologist as I understand, Dr Sonny Dandy, who restarted her on carbamazepine and she was initially taking 100 mg twice daily and was recently advised to increase it to 200 mg twice daily.  She is also on gabapentin 300 mg once daily.   I had previously evaluated her for trigeminal neuralgia and she has not been seen in this clinic in about 3 and half years.   She saw Ward Givens, NP, in the interim on 01/05/2017, at which time she was no longer on carbamazepine but had return of left facial pain.  She was advised to restart carbamazepine with 100 mg twice daily.  She had a subsequent appointment with Ward Givens, NP on 07/06/2017, at which time she was on carbamazepine 100 mg twice daily, she reported significant stressors at the time.  She was advised to increase the carbamazepine 250 mg twice daily.   She had a recent brain MRI with and without contrast on 11/28/2020 and I reviewed the results:   IMPRESSION: Stable from 2017.  No explanation for left trigeminal neuralgia.   I was also personally able to review the images through the PACS system.  Some motion degradation was  noted.  Symptoms have improved, she reports that carbamazepine has been helpful.  In addition, she is on Adderall 20 mg twice daily, gabapentin 300 mg once daily.  She had a Toradol shot through your office on 11/08/2020 when she saw Eloy End, [place].  Previously:  05/10/2016: (She) presents with a history of a L facial pain, which started about 3 weeks ago.    I last saw her on 03/22/2016, at which time she reported that she had been off of Adderall for the past 3 months but was not doing as well as before. Chest being on Nuvigil was not helpful as much. She had taken herself off of the Adderall in the hope of getting by without it. She had also more issues with attention and focus and misplacing things.  She had some knee pain. She was tolerating the Nuvigil.   She reports a fall in March 2017, knee gave out, while walking her dog, fell on cement, broke her sun glasses and sprained her right ankle, fell on L side of face and teeth touched the dirt. She went to see dentist, felt sore in the teeth, but no injuries were seen, no loose crowns. She had a tooth X ray on 04/10/16, after she saw Dr. Loyal Gambler. She has a short lived severe lightning like pain, sometimes burning, seems to be triggered by cold air, opening mouth or sudden turn of head. Of note, the pain severity was very severe but very brief,  not easy to locate exactly if it's closer to upper jaw or lower jaw but severe enough to make her cry she states. Saw Dr. Marcene Duos who is a friend of the family, and was told this could be TGN and she was prescribed carbamazepine 100 mg bid. She feels improved. Of note, she saw her ENT, Dr. Benjamine Mola recently tool. Of note, she had an MRI brain w/wo contrast on 10/10/09: IMPRESSION: Abnormal, asymmetric fluid collection in the right petrous apex as compared to the left; the lesion displays a prolonged T2, an intermediate to short T1, displays peripheral greater than central post contrast enhancement, and could  represent an inflammatory process.  CT temporal bone recommend further evaluation.   No acute or focal intracranial findings.     I saw her on 09/22/2015, at which time she reported that the increase in Nuvigil helped. She felt much improved. She was more alert at work and had no side effects. She stopped using her energy drinks that she was utilizing before. In fact, she also felt she could do without Adderall. She was in the process of reducing the Adderall. She had recent blood work with her PCP. She had a borderline cholesterol. We mutually agreed to continue with Nuvigil 250 mg once daily before she would go to work. We talked about her sleep study results at the time. She had a mean sleep latency of 12.1 minute, but did achieve REM sleep in 2 out of 4 naps, nap #1 and 2, in the context of decreased REM percentage and delayed REM onset during the nighttime sleep study, findings in conjunction with her clinical history suggestive of delayed sleep phase syndrome and shift work sleep disorder.    I saw her on 06/23/2015, at which time we talked about her PSG and MSLT from 06/08/2015 as well as 06/09/2015, respectively. She had no significant sleep disordered breathing. She had mild PLMS during the nighttime sleep study without significant arousals. She had no significant RLS symptoms. Her nap study showed a mean sleep latency of 12.1 minutes as well as to REM onset naps out of 4 naps, in nap #1 and 2. She gave no telltale history of narcolepsy and her nighttime sleep study showed a decreased percentage of REM sleep in a prolonged REM latency as well as an increased percentage of light stage sleep. I felt that in light of her shift work disorder this was most likely a circadian rhythm disorder of his shift work type. She was advised to try Nuvigil 150 mg strength once daily before going to work. She was also advised to try to bright light therapy when she first wakes up and melatonin which he goes to bed.  On 07/15/15 she called and reported residual sleepiness. We increased her Nuvigil to 250 mg once daily.     I first met her on 05/12/2015 at the request of her primary care provider, at which time she reported snoring, excessive daytime somnolence, nonrestorative sleep and shift work for 6 years. I invited her back for sleep study at night, followed by a daytime MSLT. She had a baseline sleep study on 06/08/2015 and a next a nap study on 06/09/2015 and I went over her test results with her in detail today. Her nighttime sleep study showed a sleep efficiency of reduced at 77.7% with a normal sleep latency of 16.5 minutes and wake after sleep onset of 88.5 minutes with mild to moderate sleep fragmentation noted. The arousal index was 13.8 per hour,  primarily secondary to spontaneous arousals. She had an increased percentage of stage I and stage II sleep at 14.1 and 73.5% respectively, absence of slow-wave sleep and a decreased percentage of REM sleep at 12.4% with a prolonged REM latency of 164 minutes. She had mild PLMS with an index of 6.2 per hour, resulting in 1.5 arousals per hour. She had no significant EKG changes. She had mild intermittent snoring. Total AHI was 1 per hour, average oxygen saturation was 95%, nadir was 90%. She had a next a MSLT on 06/09/2015 which showed a mean sleep latency of 12.5 minutes indicating mild sleepiness. REM sleep was noted during nap #1 and 2. I felt that her studies did not support the diagnosis of narcolepsy or idiopathic hypersomnolence based on a mean sleep latency of over 8 minutes, the fact that she had decreased REM sleep and delay and REM sleep during the nighttime study and the fact that she had REM sleep during nap #1 and 2 only, would support a circadian rhythm disorder such as delayed sleep phase syndrome, and could reflect shift work disorder based on her work schedule and sleep schedule typically.    05/12/2015: She reports snoring and excessive daytime  somnolence. She has not felt rested in years, she can fall asleep anytime and fall asleep at the drop of a hat. She does not need to have the TV on at night. She works from 4 PM to 2 AM, M-Th, off on Friday through Sun. She has had this work schedule for 6 years. She works in [employer], running a machine, never had issues performing at works, never fallen asleep at the wheel. She denies episodes of sleep paralysis, hypnagogic or hypnopompic hallucinations, or cataplexy. She feels that she rarely dreams. When she dreams these are not good dreams. She denies restless leg symptoms. She is a restless sleeper. She does not wake up rested. She has had sleepiness during the day for years. She goes to bed around 3 AM but may not fall asleep until 4 or 4:30. Rise time is between 10 AM and noon. On Fridays and Saturdays as well as Sundays she goes to bed around 11 PM. She does not have a family history of obstructive sleep apnea. She has allergy symptoms. She had nasal surgery years ago for deviated septum. She denies morning headaches. She has nocturia, typically once or twice per night. Her Epworth sleepiness score is 14 out of 24 today, her fatigue scores 46 out of 63. She does not smoke. She drinks alcohol occasionally. She drinks caffeine in the form of 5 hour energy drinks once a day. She has been on Adderall XR 20 mg once daily for about a year. It has helped her concentration but not necessarily her sleepiness. She reports memory issues. She feels that she is becoming forgetful. She has a family history of heart disease and stroke. She lives alone. Her boyfriend has commented on her snoring that has not told her that she has pauses in her breathing. She has woken herself up with a sense of gagging at times. I reviewed your office note from 03/21/15, which you kindly included. She had blood work in July through her [facility], and her synthroid was increased.    Her Past Medical History Is Significant For: Past  Medical History:  Diagnosis Date  . Adult attention deficit disorder   . Arthritis   . Complication of anesthesia    "hard to wake up"  . Constipation   .  Fatigue 03/07/2015  . Headache(784.0)   . Hematuria 04/12/2013  . Hypothyroidism   . Postmenopause 03/07/2015  . Primary localized osteoarthritis of left knee 08/26/2016  . Sleep disturbance 03/07/2015  . Snoring   . Thyroid disease   . Trigeminal neuralgia pain     Her Past Surgical History Is Significant For: Past Surgical History:  Procedure Laterality Date  . ABDOMINAL HYSTERECTOMY    . BREAST CYST ASPIRATION  2015  . CARPAL TUNNEL RELEASE Right 07/2016  . COLONOSCOPY  09/24/2011   CVE:LFYBOFB polyps in the ascending colon/diverticula in the sigmoid colon/internal hemorrhoids  . cosmetic eye surgery    . fibroids removed from uterus    . HYSTEROSCOPY WITH D & C  12/03/2011   Procedure: DILATATION AND CURETTAGE /HYSTEROSCOPY;  Surgeon: Jonnie Kind, MD;  Location: [facility];  Service: Gynecology;  Laterality: N/A;  . KNEE ARTHROSCOPY W/ DEBRIDEMENT    . LAPAROSCOPIC SALPINGO OOPHERECTOMY  07/11/2012   Procedure: LAPAROSCOPIC SALPINGO OOPHORECTOMY;  Surgeon: Jonnie Kind, MD;  Location: [facility];  Service: Gynecology;  Laterality: Bilateral;  . LAPAROSCOPIC SUPRACERVICAL HYSTERECTOMY  07/11/2012   Procedure: LAPAROSCOPIC SUPRACERVICAL HYSTERECTOMY;  Surgeon: Jonnie Kind, MD;  Location: [facility];  Service: Gynecology;  Laterality: N/A;  . LASIK    . NASAL SEPTUM SURGERY    . POLYPECTOMY  12/03/2011   Procedure: POLYPECTOMY;  Surgeon: Jonnie Kind, MD;  Location: [facility];  Service: Gynecology;  Laterality: N/A;  resection of polyp  . TOTAL KNEE ARTHROPLASTY Left 09/06/2016  . TOTAL KNEE ARTHROPLASTY Left 09/06/2016   Procedure: TOTAL KNEE ARTHROPLASTY;  Surgeon: Elsie Saas, MD;  Location: [facility];  Service: Orthopedics;  Laterality: Left;  . TRIGGER FINGER RELEASE  12/2016  . VEIN SURGERY     Varicose veins    Her Family  History Is Significant For: Family History  Problem Relation Age of Onset  . Colon cancer Maternal Uncle   . Cancer Sister   . Thyroid disease Sister   . Breast cancer Sister 20       Twin Sister  . Heart disease Father   . Hypertension Father   . Stroke Mother   . Asthma Mother   . Hypertension Mother   . Breast cancer Cousin     Her Social History Is Significant For: Social History   Socioeconomic History  . Marital status: Divorced    Spouse name: Not on file  . Number of children: 0  . Years of education: College  . Highest education level: Not on file  Occupational History  . Not on file  Tobacco Use  . Smoking status: Never Smoker  . Smokeless tobacco: Never Used  Vaping Use  . Vaping Use: Never used  Substance and Sexual Activity  . Alcohol use: Yes    Alcohol/week: 1.0 standard drink    Types: 1 Shots of liquor per week    Comment: 1-2 times a week  . Drug use: No  . Sexual activity: Yes    Birth control/protection: Surgical    Comment: supracervical hyst  Other Topics Concern  . Not on file  Social History Narrative   Drinks about 5- 5 Hour [employer] a week    Social Determinants of Radio broadcast assistant Strain: Not on file  Food Insecurity: Not on file  Transportation Needs: Not on file  Physical Activity: Not on file  Stress: Not on file  Social Connections: Not on file    Her Allergies Are:  No Known Allergies:   Her Current Medications Are:  Outpatient Encounter Medications as of 12/29/2020  Medication Sig  . amphetamine-dextroamphetamine (ADDERALL) 20 MG tablet Take 20 mg by mouth 2 (two) times daily.   . carbamazepine (TEGRETOL) 100 MG chewable tablet Chew 1.5 tablets (150 mg total) by mouth 2 (two) times daily.  . Cholecalciferol (VITAMIN D3) 25 MCG (1000 UT) CAPS Take by mouth.  . gabapentin (NEURONTIN) 300 MG capsule Take 300 mg by mouth as needed.   Marland Kitchen ibuprofen (ADVIL,MOTRIN) 800 MG tablet Take 1 tablet (800 mg total) by mouth  every 8 (eight) hours as needed.  Marland Kitchen levothyroxine (SYNTHROID, LEVOTHROID) 100 MCG tablet TAKE 1 TABLET BY MOUTH DAILY BEFORE BREAKFAST.  Marland Kitchen traMADol (ULTRAM) 50 MG tablet Take 50 mg by mouth every 6 (six) hours as needed.  Marland Kitchen XIIDRA 5 % SOLN INSTILL 1 DROP INTO BOTHKEYES TWICE DAILY.  . [DISCONTINUED] nabumetone (RELAFEN) 750 MG tablet  (Patient not taking: Reported on 12/29/2020)   No facility-administered encounter medications on file as of 12/29/2020.  :   Review of Systems:  Out of a complete 14 point review of systems, all are reviewed and negative with the exception of these symptoms as listed below: Review of Systems  Neurological:       Here to follow up again on Trigeminal Neuralgia. Pt reports sx were stable over the last 3 years but back in Feb sx returned. Pt was started back on Carbamazepine tabs twice per day. Reports sx are better now. Pt does feel like fluid in her ears could be exacerbating her sx.      Objective:  Neurological Exam  Physical Exam Physical Examination:   Vitals:   12/29/20 0846  BP: 131/75  Pulse: 77    General Examination: The patient is a very pleasant 60 y.o. female in no acute distress. She appears well-developed and well-nourished and well groomed.   HEENT: Normocephalic, atraumatic, pupils are equal, round and reactive to light, extraocular tracking is good without limitation to gaze excursion or nystagmus noted. Normal smooth pursuit is noted. Hearing is grossly intact.  Tympanic membranes are clear bilaterally. Face is symmetric with normal facial animation and normal facial sensation. does not currently have any pain especially no pain triggered by mouth opening, talking, had turns at this time.  Speech is clear with no dysarthria noted. There is no hypophonia. There is no lip, neck/head, jaw or voice tremor. Neck is supple with full range of passive and active motion. There are no carotid bruits on auscultation. Oropharynx exam reveals: mild  mouth dryness, good dental hygiene. Tongue protrudes centrally and palate elevates symmetrically.   Chest: Clear to auscultation without wheezing, rhonchi or crackles noted.  Heart: S1+S2+0, regular and normal without murmurs, rubs or gallops noted.   Abdomen: Soft, non-tender and non-distended with normal bowel sounds appreciated on auscultation.  Extremities: There is no pitting edema in the distal lower extremities bilaterally.   Skin: Warm and dry without trophic changes noted. There are no varicose veins.  Musculoskeletal: exam reveals no obvious joint deformities, tenderness or joint swelling or erythema, with the exception of mild left knee swelling.   Neurologically:  Mental status: The patient is awake, alert and oriented in all 4 spheres. Her immediate and remote memory, attention, language skills and fund of knowledge are appropriate. There is no evidence of aphasia, agnosia, apraxia or anomia. Speech is clear with normal prosody and enunciation. Thought process is linear. Mood is normal and affect is normal.  Cranial nerves II - XII are as described above under HEENT exam.  Motor exam: Normal bulk, strength and tone is noted. There is no drift, tremor or rebound. Romberg is negative. Reflexes are 2+ throughout, except diminished in the left knee.  Fine motor skills and coordination: intact with normal finger taps, normal hand movements, normal rapid alternating patting, normal foot taps and normal foot agility.  Cerebellar testing: No dysmetria or intention tremor. There is no truncal or gait ataxia.  Sensory exam: intact to light touch in the upper and lower extremities.  Gait, station and balance: She stands easily. No veering to one side is noted. No leaning to one side is noted. Posture is age-appropriate and stance is narrow based. Gait shows normal stride length and normal pace.    Assessment and Plan:   In summary, JALONDA ANTIGUA is a very pleasant 60 year old  right-handed woman with an underlying medical history of hypothyroidism, Planter fasciitis, carpal tunnel syndrome, arthritis, status post left total knee replacement in January 2018, who presents for evaluation of her trigeminal neuralgia.  Symptoms and presentation are consistent, she had success with carbamazepine in the past and is currently back on it, 200 mg twice daily.  She had a recent brain MRI with and without contrast and I reviewed the results.  She is advised to consider a neurosurgical consultation for evaluation for gamma knife surgery.  She would like to think about it.  If she calls back, I would be happy to refer her to Dr. Arlan Organ at [facility].  She is advised to continue with her carbamazepine and I renewed her prescription as per her request.  I ordered a CBC and CMP, I was not sure when she had her last blood work through your office after starting the carbamazepine.  Unfortunately, we had her check out before she stopped at the phlebotomy suite.  She therefore had not had her CBC and CMP drawn.  Please consider doing this through your office if it is more convenient for the patient as far as location.  Otherwise, we will call her and offer her to come back in for blood work.  She is advised to follow-up through this office to see one of our nurse practitioners in about 3 months routinely.  I renewed her carbamazepine prescription for 200 mg twice daily.  She is advised to call us if she would like to pursue the neurosurgical consultation.  I answered all her questions today and she was in agreement. Thank you very much for allowing me to participate in the care of this nice patient. If I can be of any further assistance to you please do not hesitate to call me at [phone].  Sincerely,   Star Age, MD, PhD

## 2021-01-18 ENCOUNTER — Ambulatory Visit
Admission: EM | Admit: 2021-01-18 | Discharge: 2021-01-18 | Disposition: A | Discharge status: Home / Self Care | Payer: BC Managed Care – PPO | Attending: Family Medicine | Admitting: Family Medicine

## 2021-01-18 ENCOUNTER — Other Ambulatory Visit: Payer: Self-pay

## 2021-01-18 DIAGNOSIS — J0101 Acute recurrent maxillary sinusitis: Secondary | ICD-10-CM | POA: Diagnosis not present

## 2021-01-18 DIAGNOSIS — Z1152 Encounter for screening for COVID-19: Secondary | ICD-10-CM

## 2021-01-18 MED ORDER — IPRATROPIUM BROMIDE 0.03 % NA SOLN: 2.0000 | Freq: Three times a day (TID) | NASAL | 0 refills | Status: DC | PRN

## 2021-01-18 MED ORDER — DOXYCYCLINE HYCLATE 100 MG PO CAPS: 100.0000 mg | ORAL_CAPSULE | Freq: Two times a day (BID) | ORAL | 0 refills | Status: AC

## 2021-01-18 NOTE — ED Triage Notes (Signed)
Patient presents to Urgent Care with complaints of sore throat and right ear pain x 3 days ago. Pt states she has a hx of seasonal allergies and sinus infections. Treating symptoms with allergy meds and ibuprofen.    Denies fever.

## 2021-01-18 NOTE — ED Provider Notes (Signed)
[facility]    CSN: [id number] Arrival date & time: 01/18/21  0827      History   Chief Complaint Chief Complaint  Patient presents with  . Sore Throat  . Otalgia    Right    HPI Yvette Joyce is a 60 y.o. female.   HPI  Patient presents with URI symptoms including cough, sore throat, otalgia, nasal congestion.  She has been without fever.  Denies any known exposure to COVID.  She is fully vaccinated and boosted against COVID.  Denies any shortness of breath.  Endorses that sore throat is mostly on the right side and is radiating into her right ear.  She has taken multiple over-the-counter allergy and cough and cold preparations without any relief of symptoms.   Past Medical History:  Diagnosis Date  . Adult attention deficit disorder   . Arthritis   . Complication of anesthesia    "hard to wake up"  . Constipation   . Fatigue 03/07/2015  . Headache(784.0)   . Hematuria 04/12/2013  . Hypothyroidism   . Postmenopause 03/07/2015  . Primary localized osteoarthritis of left knee 08/26/2016  . Sleep disturbance 03/07/2015  . Snoring   . Thyroid disease   . Trigeminal neuralgia pain     Patient Active Problem List   Diagnosis Date Noted  . Grief reaction 05/27/2017  . Fecal occult blood test positive 05/27/2017  . Encounter for gynecological examination with Papanicolaou smear of cervix 05/27/2017  . Trigger finger of right thumb 09/02/2016  . Primary localized osteoarthritis of left knee 08/26/2016  . Trigeminal neuralgia pain   . Bilateral carpal tunnel syndrome 06/17/2016  . Ganglion cyst 06/17/2016  . Fatigue 03/07/2015  . Sleep disturbance 03/07/2015  . Postmenopause 03/07/2015  . Hemorrhoids 11/13/2014  . Knee pain, acute 05/01/2014  . Left leg weakness 05/01/2014  . Hypothyroid 02/28/2014  . Dilated cbd, acquired 06/22/2013  . Hematuria 04/12/2013    Past Surgical History:  Procedure Laterality Date  . ABDOMINAL HYSTERECTOMY    . BREAST CYST  ASPIRATION  2015  . CARPAL TUNNEL RELEASE Right 07/2016  . COLONOSCOPY  09/24/2011   [id number] polyps in the ascending colon/diverticula in the sigmoid colon/internal hemorrhoids  . cosmetic eye surgery    . fibroids removed from uterus    . HYSTEROSCOPY WITH D & C  12/03/2011   Procedure: DILATATION AND CURETTAGE /HYSTEROSCOPY;  Surgeon: Jonnie Kind, MD;  Location: [facility];  Service: Gynecology;  Laterality: N/A;  . KNEE ARTHROSCOPY W/ DEBRIDEMENT    . LAPAROSCOPIC SALPINGO OOPHERECTOMY  07/11/2012   Procedure: LAPAROSCOPIC SALPINGO OOPHORECTOMY;  Surgeon: Jonnie Kind, MD;  Location: [facility];  Service: Gynecology;  Laterality: Bilateral;  . LAPAROSCOPIC SUPRACERVICAL HYSTERECTOMY  07/11/2012   Procedure: LAPAROSCOPIC SUPRACERVICAL HYSTERECTOMY;  Surgeon: Jonnie Kind, MD;  Location: [facility];  Service: Gynecology;  Laterality: N/A;  . LASIK    . NASAL SEPTUM SURGERY    . POLYPECTOMY  12/03/2011   Procedure: POLYPECTOMY;  Surgeon: Jonnie Kind, MD;  Location: [facility];  Service: Gynecology;  Laterality: N/A;  resection of polyp  . TOTAL KNEE ARTHROPLASTY Left 09/06/2016  . TOTAL KNEE ARTHROPLASTY Left 09/06/2016   Procedure: TOTAL KNEE ARTHROPLASTY;  Surgeon: Elsie Saas, MD;  Location: [place];  Service: Orthopedics;  Laterality: Left;  . TRIGGER FINGER RELEASE  12/2016  . VEIN SURGERY     Varicose veins    OB History    [place]  Para      Term      Preterm      AB      Living  0     SAB      IAB      Ectopic      Multiple      Live Births               Home Medications    Prior to Admission medications   Medication Sig Start Date End Date Taking? Authorizing Provider  amphetamine-dextroamphetamine (ADDERALL) 20 MG tablet Take 20 mg by mouth 2 (two) times daily.  10/28/17   [provider]  carbamazepine (TEGRETOL) 100 MG chewable tablet Chew 2 tablets (200 mg total) by mouth 2 (two) times daily. 12/29/20   Star Age, MD   Cholecalciferol (VITAMIN D3) 25 MCG (1000 UT) CAPS Take by mouth.    [provider]  gabapentin (NEURONTIN) 300 MG capsule Take 300 mg by mouth as needed.  06/25/17   [provider]  ibuprofen (ADVIL,MOTRIN) 800 MG tablet Take 1 tablet (800 mg total) by mouth every 8 (eight) hours as needed. 03/12/16   Estill Dooms, NP  levothyroxine (SYNTHROID, LEVOTHROID) 100 MCG tablet TAKE 1 TABLET BY MOUTH DAILY BEFORE BREAKFAST. 01/24/17   Estill Dooms, NP  traMADol (ULTRAM) 50 MG tablet Take 50 mg by mouth every 6 (six) hours as needed. 11/21/20   [provider]  XIIDRA 5 % SOLN INSTILL 1 DROP INTO BOTHKEYES TWICE DAILY. 09/09/20   [provider]    Family History Family History  Problem Relation Age of Onset  . Colon cancer Maternal Uncle   . Cancer Sister   . Thyroid disease Sister   . Breast cancer Sister 68       Twin Sister  . Heart disease Father   . Hypertension Father   . Stroke Mother   . Asthma Mother   . Hypertension Mother   . Breast cancer Cousin     Social History Social History   Tobacco Use  . Smoking status: Never Smoker  . Smokeless tobacco: Never Used  Vaping Use  . Vaping Use: Never used  Substance Use Topics  . Alcohol use: Yes    Alcohol/week: 1.0 standard drink    Types: 1 Shots of liquor per week    Comment: 1-2 times a week  . Drug use: No     Allergies   Patient has no known allergies.   Review of Systems Review of Systems Pertinent negatives listed in HPI  Physical Exam Triage Vital Signs ED Triage Vitals  Enc Vitals Group     BP 01/18/21 0916 (S) (!) 151/89     Pulse Rate 01/18/21 0916 82     Resp 01/18/21 0916 16     Temp 01/18/21 0916 98.1 F (36.7 C)     Temp Source 01/18/21 0916 Oral     SpO2 01/18/21 0916 98 %     Weight --      Height --      Head Circumference --      Peak Flow --      Pain Score 01/18/21 0915 7     Pain Loc --      Pain Edu? --      Excl. in [place]? --    No  data found.  Updated Vital Signs BP (S) (!) 151/89 (BP Location: Right Arm)   Pulse 82   Temp 98.1 F (  36.7 C) (Oral)   Resp 16   LMP 11/25/2011   SpO2 98%   Visual Acuity Right Eye Distance:   Left Eye Distance:   Bilateral Distance:    Right Eye Near:   Left Eye Near:    Bilateral Near:     Physical Exam  General Appearance:    Alert, cooperative, no distress  HENT:   Normocephalic, ears normal, nares mucosal edema with congestion, rhinorrhea, oropharynx erythematous and edematous (Right) adenopathy   Eyes:    PERRL, conjunctiva/corneas clear, EOM's intact       Lungs:     Clear to auscultation bilaterally, respirations unlabored  Heart:    Regular rate and rhythm  Neurologic:   Awake, alert, oriented x 3. No apparent focal neurological           defect.     UC Treatments / Results  Labs (all labs ordered are listed, but only abnormal results are displayed) Labs Reviewed - No data to display  EKG   Radiology No results found.  Procedures Procedures (including critical care time)  Medications Ordered in UC Medications - No data to display  Initial Impression / Assessment and Plan / UC Course  I have reviewed the triage vital signs and the nursing notes.  Pertinent labs & imaging results that were available during my care of the patient were reviewed by me and considered in my medical decision making (see chart for details).    Treating for acute sinusitis however patient agreed to a COVID and flu test as I am unable to rule out either as a source of her symptoms.  Patient is afebrile without any respiratory distress.  Treatment per discharge instructions.  Return precautions if symptoms worsen or do not improve. Final Clinical Impressions(s) / UC Diagnoses   Final diagnoses:  Acute recurrent maxillary sinusitis  Encounter for screening for COVID-19   Discharge Instructions   None    ED Prescriptions    Medication Sig Dispense Auth. Provider    doxycycline (VIBRAMYCIN) 100 MG capsule Take 1 capsule (100 mg total) by mouth 2 (two) times daily for 7 days. 14 capsule Scot Jun, FNP   ipratropium (ATROVENT) 0.03 % nasal spray Place 2 sprays into both nostrils 3 (three) times daily as needed for rhinitis. 30 mL Scot Jun, FNP     PDMP not reviewed this encounter.   Scot Jun, FNP 01/18/21 1008

## 2021-01-20 LAB — COVID-19, FLU A+B NAA
Influenza A, NAA: NOT DETECTED
Influenza B, NAA: NOT DETECTED
SARS-CoV-2, NAA: NOT DETECTED

## 2021-02-03 ENCOUNTER — Other Ambulatory Visit: Payer: Self-pay

## 2021-02-03 ENCOUNTER — Encounter: Payer: Self-pay | Admitting: Emergency Medicine

## 2021-02-03 ENCOUNTER — Ambulatory Visit
Admission: EM | Admit: 2021-02-03 | Discharge: 2021-02-03 | Disposition: A | Discharge status: Home / Self Care | Payer: BC Managed Care – PPO | Attending: Family Medicine | Admitting: Family Medicine

## 2021-02-03 DIAGNOSIS — R509 Fever, unspecified: Secondary | ICD-10-CM

## 2021-02-03 DIAGNOSIS — J069 Acute upper respiratory infection, unspecified: Secondary | ICD-10-CM

## 2021-02-03 MED ORDER — LEVOCETIRIZINE DIHYDROCHLORIDE 5 MG PO TABS: 5.0000 mg | ORAL_TABLET | Freq: Every evening | ORAL | 0 refills | Status: DC

## 2021-02-03 MED ORDER — PREDNISONE 20 MG PO TABS: 40.0000 mg | ORAL_TABLET | Freq: Every day | ORAL | 0 refills | Status: DC

## 2021-02-03 MED ORDER — PROMETHAZINE-DM 6.25-15 MG/5ML PO SYRP
5.0000 mL | ORAL_SOLUTION | Freq: Three times a day (TID) | ORAL | 0 refills | Status: DC | PRN
Start: 2021-02-03 — End: 2021-11-29

## 2021-02-03 NOTE — Discharge Instructions (Addendum)
Your COVID 19 results should result within 3-5 days. °Negative results are immediately resulted to Mychart.  ° °Positive results will receive a follow-up call from our clinic. If symptoms are present, I recommend home quarantine until results are known.  ° °Alternate Tylenol and ibuprofen as needed for body aches and fever.  Symptom management per recommendations discussed today.  If any breathing difficulty or chest pain develops go immediately to the closest emergency department for evaluation.  °

## 2021-02-03 NOTE — ED Triage Notes (Signed)
Pt here for continued sinus pressure and drainage with cough; pt sts some right ear pain; pt to see ENT next month

## 2021-02-03 NOTE — ED Provider Notes (Addendum)
[facility]    CSN: [id number] Arrival date & time: 02/03/21  7673      History   Chief Complaint Chief Complaint  Patient presents with   Recurrent Sinusitis    HPI Yvette Joyce is a 60 y.o. female.   HPI Patient presents with symptoms of fatigue, persistent cough, sinus pressure and on arrival has a low-grade temperature.  Patient reports she developed a cough last evening.  She was seen here in clinic on January 18, 2021 treated for a sinus infection and reports the symptoms resolved following treatment with antibiotics.  She reports yesterday abruptly developing URI symptoms.  The symptoms have progressively worsened over the last 12 hours.  She took 1 dose of allergy medicine without any relief.  She has a follow-up scheduled with ENT in 1 month.  She does not take allergy medicine daily however suffers from recurrent sinus symptoms.    Past Medical History:  Diagnosis Date   Adult attention deficit disorder    Arthritis    Complication of anesthesia    "hard to wake up"   Constipation    Fatigue 03/07/2015   Headache(784.0)    Hematuria 04/12/2013   Hypothyroidism    Postmenopause 03/07/2015   Primary localized osteoarthritis of left knee 08/26/2016   Sleep disturbance 03/07/2015   Snoring    Thyroid disease    Trigeminal neuralgia pain     Patient Active Problem List   Diagnosis Date Noted   Grief reaction 05/27/2017   Fecal occult blood test positive 05/27/2017   Encounter for gynecological examination with Papanicolaou smear of cervix 05/27/2017   Trigger finger of right thumb 09/02/2016   Primary localized osteoarthritis of left knee 08/26/2016   Trigeminal neuralgia pain    Bilateral carpal tunnel syndrome 06/17/2016   Ganglion cyst 06/17/2016   Fatigue 03/07/2015   Sleep disturbance 03/07/2015   Postmenopause 03/07/2015   Hemorrhoids 11/13/2014   Knee pain, acute 05/01/2014   Left leg weakness 05/01/2014   Hypothyroid 02/28/2014   Dilated  cbd, acquired 06/22/2013   Hematuria 04/12/2013    Past Surgical History:  Procedure Laterality Date   ABDOMINAL HYSTERECTOMY     BREAST CYST ASPIRATION  2015   CARPAL TUNNEL RELEASE Right 07/2016   COLONOSCOPY  09/24/2011   ALP:[id number] polyps in the ascending colon/diverticula in the sigmoid colon/internal hemorrhoids   cosmetic eye surgery     fibroids removed from uterus     HYSTEROSCOPY WITH D & C  12/03/2011   Procedure: DILATATION AND CURETTAGE /HYSTEROSCOPY;  Surgeon: Jonnie Kind, MD;  Location: [facility];  Service: Gynecology;  Laterality: N/A;   KNEE ARTHROSCOPY W/ DEBRIDEMENT     LAPAROSCOPIC SALPINGO OOPHERECTOMY  07/11/2012   Procedure: LAPAROSCOPIC SALPINGO OOPHORECTOMY;  Surgeon: Jonnie Kind, MD;  Location: [facility];  Service: Gynecology;  Laterality: Bilateral;   LAPAROSCOPIC SUPRACERVICAL HYSTERECTOMY  07/11/2012   Procedure: LAPAROSCOPIC SUPRACERVICAL HYSTERECTOMY;  Surgeon: Jonnie Kind, MD;  Location: [facility];  Service: Gynecology;  Laterality: N/A;   LASIK     NASAL SEPTUM SURGERY     POLYPECTOMY  12/03/2011   Procedure: POLYPECTOMY;  Surgeon: Jonnie Kind, MD;  Location: [facility];  Service: Gynecology;  Laterality: N/A;  resection of polyp   TOTAL KNEE ARTHROPLASTY Left 09/06/2016   TOTAL KNEE ARTHROPLASTY Left 09/06/2016   Procedure: TOTAL KNEE ARTHROPLASTY;  Surgeon: Elsie Saas, MD;  Location: [place];  Service: Orthopedics;  Laterality: Left;   TRIGGER FINGER RELEASE  12/2016   VEIN SURGERY     Varicose veins    OB History     [place]      Para      Term      Preterm      AB      Living  0      SAB      IAB      Ectopic      Multiple      Live Births               Home Medications    Prior to Admission medications   Medication Sig Start Date End Date Taking? Authorizing Provider  levocetirizine (XYZAL) 5 MG tablet Take 1 tablet (5 mg total) by mouth every evening. 02/03/21  Yes Scot Jun, FNP  predniSONE  (DELTASONE) 20 MG tablet Take 2 tablets (40 mg total) by mouth daily with breakfast. 02/03/21  Yes Scot Jun, FNP  promethazine-dextromethorphan (PROMETHAZINE-DM) 6.25-15 MG/5ML syrup Take 5 mLs by mouth 3 (three) times daily as needed for cough. 02/03/21  Yes Scot Jun, FNP  amphetamine-dextroamphetamine (ADDERALL) 20 MG tablet Take 20 mg by mouth 2 (two) times daily.  10/28/17   [provider]  carbamazepine (TEGRETOL) 100 MG chewable tablet Chew 2 tablets (200 mg total) by mouth 2 (two) times daily. 12/29/20   Star Age, MD  Cholecalciferol (VITAMIN D3) 25 MCG (1000 UT) CAPS Take by mouth.    [provider]  gabapentin (NEURONTIN) 300 MG capsule Take 300 mg by mouth as needed.  06/25/17   [provider]  ibuprofen (ADVIL,MOTRIN) 800 MG tablet Take 1 tablet (800 mg total) by mouth every 8 (eight) hours as needed. 03/12/16   Estill Dooms, NP  ipratropium (ATROVENT) 0.03 % nasal spray Place 2 sprays into both nostrils 3 (three) times daily as needed for rhinitis. 01/18/21   Scot Jun, FNP  levothyroxine (SYNTHROID, LEVOTHROID) 100 MCG tablet TAKE 1 TABLET BY MOUTH DAILY BEFORE BREAKFAST. 01/24/17   Estill Dooms, NP  traMADol (ULTRAM) 50 MG tablet Take 50 mg by mouth every 6 (six) hours as needed. 11/21/20   [provider]  XIIDRA 5 % SOLN INSTILL 1 DROP INTO BOTHKEYES TWICE DAILY. 09/09/20   [provider]    Family History Family History  Problem Relation Age of Onset   Colon cancer Maternal Uncle    Cancer Sister    Thyroid disease Sister    Breast cancer Sister 39       Twin Sister   Heart disease Father    Hypertension Father    Stroke Mother    Asthma Mother    Hypertension Mother    Breast cancer Cousin     Social History Social History   Tobacco Use   Smoking status: Never   Smokeless tobacco: Never  Vaping Use   Vaping Use: Never used  Substance Use Topics   Alcohol use: Yes     Alcohol/week: 1.0 standard drink    Types: 1 Shots of liquor per week    Comment: 1-2 times a week   Drug use: No     Allergies   Patient has no known allergies.   Review of Systems Review of Systems Pertinent negatives listed in HPI   Physical Exam Triage Vital Signs ED Triage Vitals [02/03/21 0842]  Enc Vitals Group     BP (!) 146/88     Pulse Rate 91  Resp 18     Temp 99.9 F (37.7 C)     Temp Source Oral     SpO2 97 %     Weight      Height      Head Circumference      Peak Flow      Pain Score 6     Pain Loc      Pain Edu?      Excl. in [place]?    No data found.  Updated Vital Signs BP (!) 146/88 (BP Location: Right Arm)   Pulse 91   Temp 99.9 F (37.7 C) (Oral)   Resp 18   LMP 11/25/2011   SpO2 97%   Visual Acuity Right Eye Distance:   Left Eye Distance:   Bilateral Distance:    Right Eye Near:   Left Eye Near:    Bilateral Near:     Physical Exam  General Appearance:    Alert, cooperative, no distress  HENT:   Normocephalic, ears normal, nares mucosal edema with congestion, rhinorrhea, oropharynx    Eyes:    PERRL, conjunctiva/corneas clear, EOM's intact       Lungs:     Clear to auscultation bilaterally, respirations unlabored  Heart:    Regular rate and rhythm  Neurologic:   Awake, alert, oriented x 3. No apparent focal neurological    defect.      UC Treatments / Results  Labs (all labs ordered are listed, but only abnormal results are displayed) Labs Reviewed  NOVEL CORONAVIRUS, NAA    EKG   Radiology No results found.  Procedures Procedures (including critical care time)  Medications Ordered in UC Medications - No data to display  Initial Impression / Assessment and Plan / UC Course  I have reviewed the triage vital signs and the nursing notes.  Pertinent labs & imaging results that were available during my care of the patient were reviewed by me and considered in my medical decision making (see chart for details).     Patient had negative COVID flu test on 01/18/21- however has abruptly redeveloped symptoms which are worse and now is febrile.  Repeating COVID test. Advised patient antibiotics not warranted due to she recently was treated within the last 14 days with an antibiotic.  We will trial a course of prednisone while awaiting the results of COVID testing.  Hydrate well with fluids along with starting a daily antihistamine will help alleviate symptoms.  Keep follow-up with ENT.  Return as needed. Final Clinical Impressions(s) / UC Diagnoses   Final diagnoses:  Viral upper respiratory illness  Febrile illness     Discharge Instructions      Your COVID 19 results should result within 3-5 days. Negative results are immediately resulted to Mychart. Positive results will receive a follow-up call from our clinic. If symptoms are present, I recommend home quarantine until results are known.  Alternate Tylenol and ibuprofen as needed for body aches and fever.  Symptom management per recommendations discussed today.  If any breathing difficulty or chest pain develops go immediately to the closest emergency department for evaluation.     ED Prescriptions     Medication Sig Dispense Auth. Provider   predniSONE (DELTASONE) 20 MG tablet Take 2 tablets (40 mg total) by mouth daily with breakfast. 10 tablet Scot Jun, FNP   promethazine-dextromethorphan (PROMETHAZINE-DM) 6.25-15 MG/5ML syrup Take 5 mLs by mouth 3 (three) times daily as needed for cough. 140 mL Scot Jun, FNP  levocetirizine (XYZAL) 5 MG tablet Take 1 tablet (5 mg total) by mouth every evening. 90 tablet Scot Jun, FNP      PDMP not reviewed this encounter.   Scot Jun, FNP 02/03/21 [place], [place],  02/03/21 1010

## 2021-02-04 LAB — SARS-COV-2, NAA 2 DAY TAT

## 2021-02-04 LAB — NOVEL CORONAVIRUS, NAA: SARS-CoV-2, NAA: DETECTED — AB

## 2021-03-03 ENCOUNTER — Emergency Department (HOSPITAL_COMMUNITY): Payer: BC Managed Care – PPO

## 2021-03-03 ENCOUNTER — Emergency Department (HOSPITAL_COMMUNITY)
Admission: EM | Admit: 2021-03-03 | Discharge: 2021-03-03 | Disposition: A | Discharge status: Home / Self Care | Payer: BC Managed Care – PPO | Attending: Emergency Medicine | Admitting: Emergency Medicine

## 2021-03-03 ENCOUNTER — Other Ambulatory Visit: Payer: Self-pay

## 2021-03-03 DIAGNOSIS — R519 Headache, unspecified: Secondary | ICD-10-CM | POA: Diagnosis present

## 2021-03-03 DIAGNOSIS — Z96652 Presence of left artificial knee joint: Secondary | ICD-10-CM | POA: Diagnosis not present

## 2021-03-03 DIAGNOSIS — J0101 Acute recurrent maxillary sinusitis: Secondary | ICD-10-CM | POA: Insufficient documentation

## 2021-03-03 DIAGNOSIS — E039 Hypothyroidism, unspecified: Secondary | ICD-10-CM | POA: Insufficient documentation

## 2021-03-03 DIAGNOSIS — D72819 Decreased white blood cell count, unspecified: Secondary | ICD-10-CM | POA: Insufficient documentation

## 2021-03-03 LAB — CBC WITH DIFFERENTIAL/PLATELET
Abs Immature Granulocytes: 0.01 10*3/uL (ref 0.00–0.07)
Basophils Absolute: 0 10*3/uL (ref 0.0–0.1)
Basophils Relative: 1 %
Eosinophils Absolute: 0.1 10*3/uL (ref 0.0–0.5)
Eosinophils Relative: 4 %
HCT: 41.6 % (ref 36.0–46.0)
Hemoglobin: 13.6 g/dL (ref 12.0–15.0)
Immature Granulocytes: 0 %
Lymphocytes Relative: 30 %
Lymphs Abs: 1 10*3/uL (ref 0.7–4.0)
MCH: 32.9 pg (ref 26.0–34.0)
MCHC: 32.7 g/dL (ref 30.0–36.0)
MCV: 100.5 fL — ABNORMAL HIGH (ref 80.0–100.0)
Monocytes Absolute: 0.2 10*3/uL (ref 0.1–1.0)
Monocytes Relative: 8 %
Neutro Abs: 1.8 10*3/uL (ref 1.7–7.7)
Neutrophils Relative %: 57 %
Platelets: 195 10*3/uL (ref 150–400)
RBC: 4.14 MIL/uL (ref 3.87–5.11)
RDW: 13.1 % (ref 11.5–15.5)
WBC: 3.2 10*3/uL — ABNORMAL LOW (ref 4.0–10.5)
nRBC: 0 % (ref 0.0–0.2)

## 2021-03-03 LAB — BASIC METABOLIC PANEL
Anion gap: 9 (ref 5–15)
BUN: 9 mg/dL (ref 6–20)
CO2: 30 mmol/L (ref 22–32)
Calcium: 8.8 mg/dL — ABNORMAL LOW (ref 8.9–10.3)
Chloride: 100 mmol/L (ref 98–111)
Creatinine, Ser: 0.7 mg/dL (ref 0.44–1.00)
GFR, Estimated: >60 mL/min (ref >60)
Glucose, Bld: 101 mg/dL — ABNORMAL HIGH (ref 70–99)
Potassium: 3.7 mmol/L (ref 3.5–5.1)
Sodium: 139 mmol/L (ref 135–145)

## 2021-03-03 MED ORDER — AMOXICILLIN-POT CLAVULANATE 875-125 MG PO TABS: 1.0000 | ORAL_TABLET | Freq: Two times a day (BID) | ORAL | 0 refills | Status: DC

## 2021-03-03 MED ORDER — HYDROCODONE-ACETAMINOPHEN 5-325 MG PO TABS
1.0000 | ORAL_TABLET | Freq: Once | ORAL | Status: AC
Start: 2021-03-03 — End: 2021-03-03
  Administered 2021-03-03: 1 via ORAL
  Filled 2021-03-03: qty 1

## 2021-03-03 MED ORDER — IOHEXOL 300 MG/ML  SOLN
75.0000 mL | Freq: Once | INTRAMUSCULAR | Status: AC | PRN
  Administered 2021-03-03: 75 mL via INTRAVENOUS

## 2021-03-03 MED ORDER — MORPHINE SULFATE (PF) 4 MG/ML IV SOLN
4.0000 mg | Freq: Once | INTRAVENOUS | Status: AC
  Administered 2021-03-03: 4 mg via INTRAVENOUS
  Filled 2021-03-03: qty 1

## 2021-03-03 MED ORDER — HYDROCODONE-ACETAMINOPHEN 5-325 MG PO TABS: 1.0000 | ORAL_TABLET | Freq: Four times a day (QID) | ORAL | 0 refills | Status: DC | PRN

## 2021-03-03 MED ORDER — SODIUM CHLORIDE 0.9 % IV SOLN
1.0000 g | Freq: Once | INTRAVENOUS | Status: AC
  Administered 2021-03-03: 1 g via INTRAVENOUS
  Filled 2021-03-03: qty 10

## 2021-03-03 MED ORDER — MORPHINE SULFATE (PF) 4 MG/ML IV SOLN
4.0000 mg | Freq: Once | INTRAVENOUS | Status: AC
Start: 2021-03-03 — End: 2021-03-03
  Administered 2021-03-03: 4 mg via INTRAVENOUS
  Filled 2021-03-03: qty 1

## 2021-03-03 NOTE — ED Notes (Signed)
Patient transported to CT 

## 2021-03-03 NOTE — Discharge Instructions (Addendum)
Take the antibiotics until complete.  Take the pain medications as needed.  Follow-up with your ENT doctor tomorrow as planned

## 2021-03-03 NOTE — ED Provider Notes (Signed)
[facility] EMERGENCY DEPARTMENT Provider Note   CSN: [id number] Arrival date & time: 03/03/21  0542     History Chief Complaint  Patient presents with   Facial Pain    Yvette Joyce is a 60 y.o. female.  The history is provided by the patient.  She comes in complaining of left-sided facial pain which she thinks is from sinusitis.  For the last month, she has had cough productive of yellowish and greenish sputum as well as yellowish rhinorrhea and left-sided facial pain.  Pain comes in waves and is severe and she rates it at 10/10.  She has been taking carbamazepine and gabapentin without relief.  She has seen a dentist to make sure that there was not a dental cause for her pain and was told that there was not.  She is scheduled to see an ear nose throat specialist tomorrow.  She is reported to have had an MRI scan which supposedly did not show anything abnormal.  She denies fever or chills.   Past Medical History:  Diagnosis Date   Adult attention deficit disorder    Arthritis    Complication of anesthesia    "hard to wake up"   Constipation    Fatigue 03/07/2015   Headache(784.0)    Hematuria 04/12/2013   Hypothyroidism    Postmenopause 03/07/2015   Primary localized osteoarthritis of left knee 08/26/2016   Sleep disturbance 03/07/2015   Snoring    Thyroid disease    Trigeminal neuralgia pain     Patient Active Problem List   Diagnosis Date Noted   Grief reaction 05/27/2017   Fecal occult blood test positive 05/27/2017   Encounter for gynecological examination with Papanicolaou smear of cervix 05/27/2017   Trigger finger of right thumb 09/02/2016   Primary localized osteoarthritis of left knee 08/26/2016   Trigeminal neuralgia pain    Bilateral carpal tunnel syndrome 06/17/2016   Ganglion cyst 06/17/2016   Fatigue 03/07/2015   Sleep disturbance 03/07/2015   Postmenopause 03/07/2015   Hemorrhoids 11/13/2014   Knee pain, acute 05/01/2014   Left leg weakness  05/01/2014   Hypothyroid 02/28/2014   Dilated cbd, acquired 06/22/2013   Hematuria 04/12/2013    Past Surgical History:  Procedure Laterality Date   ABDOMINAL HYSTERECTOMY     BREAST CYST ASPIRATION  2015   CARPAL TUNNEL RELEASE Right 07/2016   COLONOSCOPY  09/24/2011   [id number] polyps in the ascending colon/diverticula in the sigmoid colon/internal hemorrhoids   cosmetic eye surgery     fibroids removed from uterus     HYSTEROSCOPY WITH D & C  12/03/2011   Procedure: DILATATION AND CURETTAGE /HYSTEROSCOPY;  Surgeon: Jonnie Kind, MD;  Location: [facility];  Service: Gynecology;  Laterality: N/A;   KNEE ARTHROSCOPY W/ DEBRIDEMENT     LAPAROSCOPIC SALPINGO OOPHERECTOMY  07/11/2012   Procedure: LAPAROSCOPIC SALPINGO OOPHORECTOMY;  Surgeon: Jonnie Kind, MD;  Location: [facility];  Service: Gynecology;  Laterality: Bilateral;   LAPAROSCOPIC SUPRACERVICAL HYSTERECTOMY  07/11/2012   Procedure: LAPAROSCOPIC SUPRACERVICAL HYSTERECTOMY;  Surgeon: Jonnie Kind, MD;  Location: [facility];  Service: Gynecology;  Laterality: N/A;   LASIK     NASAL SEPTUM SURGERY     POLYPECTOMY  12/03/2011   Procedure: POLYPECTOMY;  Surgeon: Jonnie Kind, MD;  Location: [facility];  Service: Gynecology;  Laterality: N/A;  resection of polyp   TOTAL KNEE ARTHROPLASTY Left 09/06/2016   TOTAL KNEE ARTHROPLASTY Left 09/06/2016   Procedure: TOTAL KNEE ARTHROPLASTY;  Surgeon: Herbie Baltimore  Noemi Chapel, MD;  Location: [place];  Service: Orthopedics;  Laterality: Left;   TRIGGER FINGER RELEASE  12/2016   VEIN SURGERY     Varicose veins     OB History     Gravida      Para      Term      Preterm      AB      Living  0      SAB      IAB      Ectopic      Multiple      Live Births              Family History  Problem Relation Age of Onset   Colon cancer Maternal Uncle    Cancer Sister    Thyroid disease Sister    Breast cancer Sister 64       Twin Sister   Heart disease Father    Hypertension Father     Stroke Mother    Asthma Mother    Hypertension Mother    Breast cancer Cousin     Social History   Tobacco Use   Smoking status: Never   Smokeless tobacco: Never  Vaping Use   Vaping Use: Never used  Substance Use Topics   Alcohol use: Yes    Alcohol/week: 1.0 standard drink    Types: 1 Shots of liquor per week    Comment: 1-2 times a week   Drug use: No    Home Medications Prior to Admission medications   Medication Sig Start Date End Date Taking? Authorizing Provider  amphetamine-dextroamphetamine (ADDERALL) 20 MG tablet Take 20 mg by mouth 2 (two) times daily.  10/28/17   [provider]  carbamazepine (TEGRETOL) 100 MG chewable tablet Chew 2 tablets (200 mg total) by mouth 2 (two) times daily. 12/29/20   Star Age, MD  Cholecalciferol (VITAMIN D3) 25 MCG (1000 UT) CAPS Take by mouth.    [provider]  gabapentin (NEURONTIN) 300 MG capsule Take 300 mg by mouth as needed.  06/25/17   [provider]  ibuprofen (ADVIL,MOTRIN) 800 MG tablet Take 1 tablet (800 mg total) by mouth every 8 (eight) hours as needed. 03/12/16   Estill Dooms, NP  ipratropium (ATROVENT) 0.03 % nasal spray Place 2 sprays into both nostrils 3 (three) times daily as needed for rhinitis. 01/18/21   Scot Jun, FNP  levocetirizine (XYZAL) 5 MG tablet Take 1 tablet (5 mg total) by mouth every evening. 02/03/21   Scot Jun, FNP  levothyroxine (SYNTHROID, LEVOTHROID) 100 MCG tablet TAKE 1 TABLET BY MOUTH DAILY BEFORE BREAKFAST. 01/24/17   Estill Dooms, NP  predniSONE (DELTASONE) 20 MG tablet Take 2 tablets (40 mg total) by mouth daily with breakfast. 02/03/21   Scot Jun, FNP  promethazine-dextromethorphan (PROMETHAZINE-DM) 6.25-15 MG/5ML syrup Take 5 mLs by mouth 3 (three) times daily as needed for cough. 02/03/21   Scot Jun, FNP  traMADol (ULTRAM) 50 MG tablet Take 50 mg by mouth every 6 (six) hours as needed. 11/21/20   [provider]  XIIDRA 5 % SOLN INSTILL 1 DROP INTO BOTHKEYES TWICE DAILY. 09/09/20   [provider]    Allergies    Patient has no known allergies.  Review of Systems   Review of Systems  All other systems reviewed and are negative.  Physical Exam Updated Vital Signs BP (!) 173/80   Pulse 71   Temp 98.3 F (  36.8 C) (Oral)   Resp 17   Ht 5\' 6"  (1.676 m)   Wt 62 kg   LMP 11/25/2011   SpO2 98%   BMI 22.06 kg/m   Physical Exam Vitals and nursing note reviewed.  60 year old female, in obvious pain, but in no acute distress. Vital signs are significant for elevated blood pressure. Oxygen saturation is 98%, which is normal. Head is normocephalic and atraumatic. PERRLA, EOMI. Oropharynx is clear.  Tympanic membranes are clear.  Examination of the nasal cavities shows a small amount of clear mucus present on the left, no swelling of turbinates.  There is no tenderness to palpation over frontal or maxillary sinuses. Neck is nontender and supple without adenopathy or JVD. Back is nontender and there is no CVA tenderness. Lungs are clear without rales, wheezes, or rhonchi. Chest is nontender. Heart has regular rate and rhythm without murmur. Abdomen is soft, flat, nontender without masses or hepatosplenomegaly and peristalsis is normoactive. Extremities have no cyanosis or edema, full range of motion is present. Skin is warm and dry without rash. Neurologic: Mental status is normal, cranial nerves are intact, moves all extremities equally.  ED Results / Procedures / Treatments   Labs (all labs ordered are listed, but only abnormal results are displayed) Labs Reviewed - No data to display  Radiology No results found.  Procedures Procedures   Medications Ordered in ED Medications - No data to display  ED Course  I have reviewed the triage vital signs and the nursing notes.  Pertinent labs & imaging results that were available during my care of the patient were reviewed by me  and considered in my medical decision making (see chart for details).   MDM Rules/Calculators/A&P                         Facial pain which seems most consistent with trigeminal neuralgia.  Old records are reviewed, and she did have an office visit with her neurologist on 5/16 mentioning an exacerbation of known trigeminal neuralgia and offer to refer for gamma knife surgery if symptoms worsen.  Patient and husband are insistent that she has a sinus infection.  Will obtain CT scan to evaluate for sinusitis.  Labs are unremarkable.  Mild leukopenia is present and unchanged from prior.  CT scan is pending.  Case is signed out to Dr. Tomi Bamberger.  Final Clinical Impression(s) / ED Diagnoses Final diagnoses:  Facial pain    Rx / DC Orders ED Discharge Orders     None        Delora Fuel, MD 24/26/83 [phone]

## 2021-03-03 NOTE — ED Triage Notes (Signed)
Pt c/o left facial pain and has had mri and is supposed to see dr Benjamine Mola tomorrow but pain got intense this am.

## 2021-03-03 NOTE — ED Provider Notes (Signed)
Patient was initially seen by Dr. Roxanne Mins.  Please see his note.  Patient CT scan does show evidence of sinusitis.  Patient is afebrile.  Patient has been on antibiotics previously.  Will prescribe Augmentin.  Patient was given a dose of IV antibiotics in the ED.  Also given prescription for hydrocodone for pain.  Patient has outpatient follow-up with ENT tomorrow   Dorie Rank, MD 03/03/21 [phone]

## 2021-03-17 ENCOUNTER — Other Ambulatory Visit: Payer: Self-pay | Admitting: Internal Medicine

## 2021-03-17 DIAGNOSIS — Z1231 Encounter for screening mammogram for malignant neoplasm of breast: Secondary | ICD-10-CM

## 2021-04-06 ENCOUNTER — Other Ambulatory Visit: Payer: Self-pay

## 2021-04-06 ENCOUNTER — Ambulatory Visit (INDEPENDENT_AMBULATORY_CARE_PROVIDER_SITE_OTHER): Payer: BC Managed Care – PPO | Admitting: Adult Health

## 2021-04-06 ENCOUNTER — Encounter: Payer: Self-pay | Admitting: Adult Health

## 2021-04-06 VITALS — BP 138/77 | HR 73 | Ht 66.0 in | Wt 138.8 lb

## 2021-04-06 DIAGNOSIS — G500 Trigeminal neuralgia: Secondary | ICD-10-CM | POA: Diagnosis not present

## 2021-04-06 MED ORDER — GABAPENTIN 100 MG PO CAPS: 100.0000 mg | ORAL_CAPSULE | Freq: Three times a day (TID) | ORAL | 11 refills | Status: DC

## 2021-04-06 NOTE — Progress Notes (Addendum)
PATIENT: Yvette Joyce DOB: [birth year]  REASON FOR VISIT: follow up HISTORY FROM: patient PRIMARY NEUROLOGIST: Dr. Rexene Alberts  HISTORY OF PRESENT ILLNESS: Today 04/06/21:  Yvette Joyce is a 60 year old female with a history of trigeminal neuralgia on the left side.  She returns today for follow-up.  The patient states that she continues to have exacerbations.  Weather seems to be a trigger for her.  She states that this morning she went out to walk the dog and the pain came on suddenly.  She states that sometimes the pain leaves as quick as it came on.  But she has some level of discomfort every day.  She has severe discomfort at least 2-3 times a week.  She states that cold water seems to be a trigger at times.  The patient is currently on carbamazepine 200 mg twice a day.  She has gabapentin 300 mg but only takes it when she has pain.  She states that the dose does make her sleepy.  She returns today for an evaluation.  HISTORY Yvette Joyce is a 60 year old right-handed woman with an underlying medical history of hypothyroidism, [place] fasciitis, carpal tunnel syndrome, arthritis, status post left total knee replacement in January 2018, who reports recurrent symptoms of left sided facial pain.  Symptoms started in February.  She had severe pain, saw her dentist, Dr. Jannifer Franklin who eventually gave her some local shots which helped for about 2 days.  She saw Dr. Tarri Glenn, oral surgeon and a hematologist as I understand, Dr Sonny Dandy, who restarted her on carbamazepine and she was initially taking 100 mg twice daily and was recently advised to increase it to 200 mg twice daily.  She is also on gabapentin 300 mg once daily.    I had previously evaluated her for trigeminal neuralgia and she has not been seen in this clinic in about 3 and half years.   She saw Ward Givens, NP, in the interim on 01/05/2017, at which time she was no longer on carbamazepine but had return of left facial pain.  She was advised to restart  carbamazepine with 100 mg twice daily.  She had a subsequent appointment with Ward Givens, NP on 07/06/2017, at which time she was on carbamazepine 100 mg twice daily, she reported significant stressors at the time.  She was advised to increase the carbamazepine 250 mg twice daily.    She had a recent brain MRI with and without contrast on 11/28/2020 and I reviewed the results:   IMPRESSION: Stable from 2017.  No explanation for left trigeminal neuralgia.   I was also personally able to review the images through the PACS system.  Some motion degradation was noted.   Symptoms have improved, she reports that carbamazepine has been helpful.  In addition, she is on Adderall 20 mg twice daily, gabapentin 300 mg once daily.  She had a Toradol shot through your office on 11/08/2020 when she saw Eloy End, [place].    REVIEW OF SYSTEMS: Out of a complete 14 system review of symptoms, the patient complains only of the following symptoms, and all other reviewed systems are negative.  ALLERGIES: No Known Allergies  HOME MEDICATIONS: Outpatient Medications Prior to Visit  Medication Sig Dispense Refill   amoxicillin-clavulanate (AUGMENTIN) 875-125 MG tablet Take 1 tablet by mouth 2 (two) times daily. 20 tablet 0   amphetamine-dextroamphetamine (ADDERALL) 20 MG tablet Take 20 mg by mouth 2 (two) times daily.      carbamazepine (TEGRETOL) 100 MG chewable  tablet Chew 2 tablets (200 mg total) by mouth 2 (two) times daily. 120 tablet 3   Cholecalciferol (VITAMIN D3) 25 MCG (1000 UT) CAPS Take by mouth.     gabapentin (NEURONTIN) 300 MG capsule Take 300 mg by mouth as needed.      HYDROcodone-acetaminophen (NORCO/VICODIN) 5-325 MG tablet Take 1 tablet by mouth every 6 (six) hours as needed. 12 tablet 0   ibuprofen (ADVIL,MOTRIN) 800 MG tablet Take 1 tablet (800 mg total) by mouth every 8 (eight) hours as needed. 60 tablet 1   ipratropium (ATROVENT) 0.03 % nasal spray Place 2 sprays into both nostrils 3 (three)  times daily as needed for rhinitis. 30 mL 0   levocetirizine (XYZAL) 5 MG tablet Take 1 tablet (5 mg total) by mouth every evening. 90 tablet 0   levothyroxine (SYNTHROID, LEVOTHROID) 100 MCG tablet TAKE 1 TABLET BY MOUTH DAILY BEFORE BREAKFAST. 30 tablet 2   predniSONE (DELTASONE) 20 MG tablet Take 2 tablets (40 mg total) by mouth daily with breakfast. 10 tablet 0   promethazine-dextromethorphan (PROMETHAZINE-DM) 6.25-15 MG/5ML syrup Take 5 mLs by mouth 3 (three) times daily as needed for cough. 140 mL 0   XIIDRA 5 % SOLN INSTILL 1 DROP INTO BOTHKEYES TWICE DAILY.     No facility-administered medications prior to visit.    PAST MEDICAL HISTORY: Past Medical History:  Diagnosis Date   Adult attention deficit disorder    Arthritis    Complication of anesthesia    "hard to wake up"   Constipation    Fatigue 03/07/2015   Headache(784.0)    Hematuria 04/12/2013   Hypothyroidism    Postmenopause 03/07/2015   Primary localized osteoarthritis of left knee 08/26/2016   Sleep disturbance 03/07/2015   Snoring    Thyroid disease    Trigeminal neuralgia pain     PAST SURGICAL HISTORY: Past Surgical History:  Procedure Laterality Date   ABDOMINAL HYSTERECTOMY     BREAST CYST ASPIRATION  2015   CARPAL TUNNEL RELEASE Right 07/2016   COLONOSCOPY  09/24/2011   [id number] polyps in the ascending colon/diverticula in the sigmoid colon/internal hemorrhoids   cosmetic eye surgery     fibroids removed from uterus     HYSTEROSCOPY WITH D & C  12/03/2011   Procedure: [place] /HYSTEROSCOPY;  Surgeon: Jonnie Kind, MD;  Location: [facility];  Service: Gynecology;  Laterality: N/A;   KNEE ARTHROSCOPY W/ DEBRIDEMENT     LAPAROSCOPIC SALPINGO OOPHERECTOMY  07/11/2012   Procedure: LAPAROSCOPIC SALPINGO OOPHORECTOMY;  Surgeon: Jonnie Kind, MD;  Location: [facility];  Service: Gynecology;  Laterality: Bilateral;   LAPAROSCOPIC SUPRACERVICAL HYSTERECTOMY  07/11/2012   Procedure: LAPAROSCOPIC  SUPRACERVICAL HYSTERECTOMY;  Surgeon: Jonnie Kind, MD;  Location: [facility];  Service: Gynecology;  Laterality: N/A;   LASIK     NASAL SEPTUM SURGERY     POLYPECTOMY  12/03/2011   Procedure: POLYPECTOMY;  Surgeon: Jonnie Kind, MD;  Location: [facility];  Service: Gynecology;  Laterality: N/A;  resection of polyp   TOTAL KNEE ARTHROPLASTY Left 09/06/2016   TOTAL KNEE ARTHROPLASTY Left 09/06/2016   Procedure: TOTAL KNEE ARTHROPLASTY;  Surgeon: Elsie Saas, MD;  Location: [place];  Service: Orthopedics;  Laterality: Left;   TRIGGER FINGER RELEASE  12/2016   VEIN SURGERY     Varicose veins    FAMILY HISTORY: Family History  Problem Relation Age of Onset   Colon cancer Maternal Uncle    Cancer Sister    Thyroid disease  Sister    Breast cancer Sister 51       Twin Sister   Heart disease Father    Hypertension Father    Stroke Mother    Asthma Mother    Hypertension Mother    Breast cancer Cousin     SOCIAL HISTORY: Social History   Socioeconomic History   Marital status: Divorced    Spouse name: Not on file   Number of children: 0   Years of education: College   Highest education level: Not on file  Occupational History   Not on file  Tobacco Use   Smoking status: Never   Smokeless tobacco: Never  Vaping Use   Vaping Use: Never used  Substance and Sexual Activity   Alcohol use: Yes    Alcohol/week: 1.0 standard drink    Types: 1 Shots of liquor per week    Comment: 1-2 times a week   Drug use: No   Sexual activity: Yes    Birth control/protection: Surgical    Comment: supracervical hyst  Other Topics Concern   Not on file  Social History Narrative   Drinks about 5- 5 Hour [employer] a week    Social Determinants of Radio broadcast assistant Strain: Not on file  Food Insecurity: Not on file  Transportation Needs: Not on file  Physical Activity: Not on file  Stress: Not on file  Social Connections: Not on file  Intimate Partner Violence: Not on file       PHYSICAL EXAM  There were no vitals filed for this visit. There is no height or weight on file to calculate BMI.  Generalized: Well developed, in no acute distress   Neurological examination  Mentation: Alert oriented to time, place, history taking. Follows all commands speech and language fluent Cranial nerve II-XII: Pupils were equal round reactive to light. Extraocular movements were full, visual field were full on confrontational test. Facial sensation and strength were normal. Uvula tongue midline. Head turning and shoulder shrug  were normal and symmetric. Motor: The motor testing reveals 5 over 5 strength of all 4 extremities. Good symmetric motor tone is noted throughout.  Sensory: Sensory testing is intact to soft touch on all 4 extremities. No evidence of extinction is noted.  Coordination: Cerebellar testing reveals good finger-nose-finger and heel-to-shin bilaterally.  Gait and station: Gait is normal.   DIAGNOSTIC DATA (LABS, IMAGING, TESTING) - I reviewed patient records, labs, notes, testing and imaging myself where available.  Lab Results  Component Value Date   WBC 3.2 (L) 03/03/2021   HGB 13.6 03/03/2021   HCT 41.6 03/03/2021   MCV 100.5 (H) 03/03/2021   PLT 195 03/03/2021      Component Value Date/Time   NA 139 03/03/2021 0625   NA 145 (H) 07/06/2017 1534   K 3.7 03/03/2021 0625   CL 100 03/03/2021 0625   CO2 30 03/03/2021 0625   GLUCOSE 101 (H) 03/03/2021 0625   BUN 9 03/03/2021 0625   BUN 7 07/06/2017 1534   CREATININE 0.70 03/03/2021 0625   CREATININE 0.70 02/28/2014 1208   CALCIUM 8.8 (L) 03/03/2021 0625   PROT 6.1 07/06/2017 1534   ALBUMIN 4.0 07/06/2017 1534   AST 37 07/06/2017 1534   ALT 29 07/06/2017 1534   ALKPHOS 143 (H) 07/06/2017 1534   BILITOT 0.3 07/06/2017 1534   GFRNONAA >60 03/03/2021 0625   GFRAA 108 07/06/2017 1534   Lab Results  Component Value Date   CHOL 184 02/28/2014  HDL 64 02/28/2014   LDLCALC 108 (H)  02/28/2014   TRIG 62 02/28/2014   CHOLHDL 2.9 02/28/2014   No results found for: HGBA1C No results found for: VITAMINB12 Lab Results  Component Value Date   TSH 4.320 07/07/2015      ASSESSMENT AND PLAN 60 y.o. year old female  has a past medical history of Adult attention deficit disorder, Arthritis, Complication of anesthesia, Constipation, Fatigue (03/07/2015), Headache(784.0), Hematuria (04/12/2013), Hypothyroidism, Postmenopause (03/07/2015), Primary localized osteoarthritis of left knee (08/26/2016), Sleep disturbance (03/07/2015), Snoring, Thyroid disease, and Trigeminal neuralgia pain. here with :  1.  Trigeminal neuralgia on the left  --Continue carbamazepine 200 mg twice a day --Take gabapentin 100 mg 3 times a day consistently.  Caution the patient that initially it may cause some drowsiness but this should get better over time.  This is a low dose and may need to be increased for better benefit --Patient will be referred to [facility] for evaluation for gamma knife --Follow-up in 6 months or sooner if needed     Ward Givens, MSN, NP-C 04/06/2021, 10:12 AM [facility] [address], [place], [place] [address] [phone]  I reviewed the above note and documentation by the Nurse Practitioner and agree with the history, exam, assessment and plan as outlined above. I was available for consultation. Star Age, MD, PhD [facility])

## 2021-04-06 NOTE — Patient Instructions (Signed)
Your Plan:  Continue Carbamazepine Take gabapentin 100 mg three times a day If your symptoms worsen or you develop new symptoms please let us know.    Thank you for coming to see [place] at [facility]. I hope we have been able to provide you high quality care today.  You may receive a patient satisfaction survey over the next few weeks. We would appreciate your feedback and comments so that we may continue to improve ourselves and the health of our patients.

## 2021-04-07 ENCOUNTER — Telehealth: Payer: Self-pay | Admitting: *Deleted

## 2021-04-07 ENCOUNTER — Telehealth: Payer: Self-pay

## 2021-04-07 DIAGNOSIS — Z0289 Encounter for other administrative examinations: Secondary | ICD-10-CM

## 2021-04-07 NOTE — Telephone Encounter (Signed)
I called pt and asked about the FMLA p/w that she asked to be filled out.  Pt stated that she has used up her vacation days for appts,  ED visits, having to leave work.    She is not able to really say how many episodes / frequency (4/week). I told her that we donot really do TN (I gave her example of migraine episodes per month etc).  She felt that might be good, as her flares were unpredictable, of how many she would have, may have one then other days come back to back.

## 2021-04-07 NOTE — Telephone Encounter (Signed)
Referral for neurosurgery sent to Dr. Arlan Organ at [facility]. P: [phone]

## 2021-04-10 ENCOUNTER — Other Ambulatory Visit: Payer: Self-pay

## 2021-04-10 ENCOUNTER — Telehealth: Payer: Self-pay | Admitting: Adult Health

## 2021-04-10 ENCOUNTER — Encounter: Payer: Self-pay | Admitting: Neurology

## 2021-04-10 ENCOUNTER — Encounter (HOSPITAL_BASED_OUTPATIENT_CLINIC_OR_DEPARTMENT_OTHER): Payer: Self-pay

## 2021-04-10 ENCOUNTER — Emergency Department (HOSPITAL_BASED_OUTPATIENT_CLINIC_OR_DEPARTMENT_OTHER)
Admission: EM | Admit: 2021-04-10 | Discharge: 2021-04-11 | Disposition: A | Discharge status: Home / Self Care | Payer: BC Managed Care – PPO | Attending: Emergency Medicine | Admitting: Emergency Medicine

## 2021-04-10 DIAGNOSIS — E039 Hypothyroidism, unspecified: Secondary | ICD-10-CM | POA: Diagnosis not present

## 2021-04-10 DIAGNOSIS — Z79899 Other long term (current) drug therapy: Secondary | ICD-10-CM | POA: Diagnosis not present

## 2021-04-10 DIAGNOSIS — G500 Trigeminal neuralgia: Secondary | ICD-10-CM

## 2021-04-10 DIAGNOSIS — Z96652 Presence of left artificial knee joint: Secondary | ICD-10-CM | POA: Diagnosis not present

## 2021-04-10 DIAGNOSIS — R6884 Jaw pain: Secondary | ICD-10-CM | POA: Diagnosis present

## 2021-04-10 NOTE — Progress Notes (Signed)
I have talked with her husband that patient is have severe left lower jaw pain,   I have advised her to increase gabapentin to '100mg'$  3 capsule three times a day, also increase cabamazepine '100mg'$  3 tabs twice a day.  Please also check on her [facility] gamma knife refer

## 2021-04-10 NOTE — ED Triage Notes (Addendum)
Pt is present for acute on chronic left jaw pain but patient states "I think it might be my teeth". During these extreme 'episodes of pain' patient shrieks and tenses up. Told by MD it may be trigeminal nerve damage. Pt took 300 mg Gabapentin. Pt is having difficulty eating or swallowing due to the pain.

## 2021-04-10 NOTE — Telephone Encounter (Signed)
Pt called, having a  trigeminal neuralgia flare up; jaws, teeth are hurting. Started taking gabapentin (NEURONTIN) 100 MG capsule prescribed last office visit 05/07/21. Medication is not working. Would like a call back.

## 2021-04-11 MED ORDER — HYDROCODONE-ACETAMINOPHEN 7.5-325 MG/15ML PO SOLN
15.0000 mL | Freq: Once | ORAL | Status: AC
  Administered 2021-04-11: 15 mL via ORAL
  Filled 2021-04-11: qty 15

## 2021-04-11 MED ORDER — GABAPENTIN 300 MG PO CAPS
300.0000 mg | ORAL_CAPSULE | Freq: Once | ORAL | Status: AC
  Administered 2021-04-11: 300 mg via ORAL
  Filled 2021-04-11: qty 1

## 2021-04-11 MED ORDER — CARBAMAZEPINE 100 MG/5ML PO SUSP: 200.0000 mg | Freq: Two times a day (BID) | ORAL | 0 refills | Status: DC

## 2021-04-11 MED ORDER — GABAPENTIN 300 MG PO CAPS: 300.0000 mg | ORAL_CAPSULE | Freq: Three times a day (TID) | ORAL | 0 refills | Status: DC

## 2021-04-11 MED ORDER — HYDROCODONE-ACETAMINOPHEN 7.5-325 MG/15ML PO SOLN: 15.0000 mL | Freq: Four times a day (QID) | ORAL | 0 refills | Status: DC | PRN

## 2021-04-11 NOTE — ED Notes (Signed)
Pt verbalizes understanding of discharge instructions. Opportunity for questioning and answers were provided. Armand removed by staff, pt discharged from ED to home. Educated to pick up Rx.  

## 2021-04-11 NOTE — ED Provider Notes (Signed)
[facility] Provider Note: Georgena Spurling, MD, FACEP  CSN: [id number] MRN: [id number] ARRIVAL: 04/10/21 at 2214 ROOM: DB006/DB006   CHIEF COMPLAINT  Jaw Pain   HISTORY OF PRESENT ILLNESS  04/11/21 12:23 AM Yvette Joyce is a 60 y.o. female with a history of trigeminal neuralgia.  She is on Neurontin 300 mg twice daily as well as carbamazepine 200 mg twice daily.  Her current bout of trigeminal neuralgia began in February of this year but she had had a previous bout 5 years ago.  She had an acute exacerbation of her pain yesterday morning.  The pain comes in sharp, electric light paroxysms lasting seconds.  She rates the pain as a 10 out of 10 and it is interfering with her ability to work.  It is often triggered by eating, drinking or brushing her teeth.  It often occurs randomly.  The pattern of the pain is consistent with the second branch of the trigeminal nerve.   Past Medical History:  Diagnosis Date   Adult attention deficit disorder    Arthritis    Complication of anesthesia    "hard to wake up"   Constipation    Fatigue 03/07/2015   Headache(784.0)    Hematuria 04/12/2013   Hypothyroidism    Postmenopause 03/07/2015   Primary localized osteoarthritis of left knee 08/26/2016   Sleep disturbance 03/07/2015   Snoring    Thyroid disease    Trigeminal neuralgia pain     Past Surgical History:  Procedure Laterality Date   ABDOMINAL HYSTERECTOMY     BREAST CYST ASPIRATION  2015   CARPAL TUNNEL RELEASE Right 07/2016   COLONOSCOPY  09/24/2011   [id number] polyps in the ascending colon/diverticula in the sigmoid colon/internal hemorrhoids   cosmetic eye surgery     fibroids removed from uterus     HYSTEROSCOPY WITH D & C  12/03/2011   Procedure: [place] /HYSTEROSCOPY;  Surgeon: Jonnie Kind, MD;  Location: [facility];  Service: Gynecology;  Laterality: N/A;   KNEE ARTHROSCOPY W/ DEBRIDEMENT     LAPAROSCOPIC SALPINGO OOPHERECTOMY  07/11/2012    Procedure: LAPAROSCOPIC SALPINGO OOPHORECTOMY;  Surgeon: Jonnie Kind, MD;  Location: [facility];  Service: Gynecology;  Laterality: Bilateral;   LAPAROSCOPIC SUPRACERVICAL HYSTERECTOMY  07/11/2012   Procedure: LAPAROSCOPIC SUPRACERVICAL HYSTERECTOMY;  Surgeon: Jonnie Kind, MD;  Location: [facility];  Service: Gynecology;  Laterality: N/A;   LASIK     NASAL SEPTUM SURGERY     POLYPECTOMY  12/03/2011   Procedure: POLYPECTOMY;  Surgeon: Jonnie Kind, MD;  Location: [facility];  Service: Gynecology;  Laterality: N/A;  resection of polyp   TOTAL KNEE ARTHROPLASTY Left 09/06/2016   TOTAL KNEE ARTHROPLASTY Left 09/06/2016   Procedure: TOTAL KNEE ARTHROPLASTY;  Surgeon: Elsie Saas, MD;  Location: [facility];  Service: Orthopedics;  Laterality: Left;   TRIGGER FINGER RELEASE  12/2016   VEIN SURGERY     Varicose veins    Family History  Problem Relation Age of Onset   Colon cancer Maternal Uncle    Cancer Sister    Thyroid disease Sister    Breast cancer Sister 43       Twin Sister   Heart disease Father    Hypertension Father    Stroke Mother    Asthma Mother    Hypertension Mother    Breast cancer Cousin     Social History   Tobacco Use   Smoking status: Never   Smokeless tobacco: Never  Vaping Use   Vaping Use: Never used  Substance Use Topics   Alcohol use: Yes    Alcohol/week: 1.0 standard drink    Types: 1 Shots of liquor per week    Comment: 1-2 times a week   Drug use: No    Prior to Admission medications   Medication Sig Start Date End Date Taking? Authorizing Provider  carBAMazepine (TEGRETOL) 100 MG/5ML suspension Take 10 mLs (200 mg total) by mouth in the morning and at bedtime. 04/11/21  Yes Tramar Brueckner, MD  gabapentin (NEURONTIN) 300 MG capsule Take 1 capsule (300 mg total) by mouth 3 (three) times daily. 04/11/21  Yes Deina Lipsey, MD  HYDROcodone-acetaminophen (HYCET) 7.5-325 mg/15 ml solution Take 15 mLs by mouth every 6 (six) hours as needed for severe pain. 04/11/21   Yes Salwa Bai, MD  amphetamine-dextroamphetamine (ADDERALL) 20 MG tablet Take 20 mg by mouth 2 (two) times daily.  10/28/17   [provider]  Cholecalciferol (VITAMIN D3) 25 MCG (1000 UT) CAPS Take by mouth.    [provider]  ipratropium (ATROVENT) 0.03 % nasal spray Place 2 sprays into both nostrils 3 (three) times daily as needed for rhinitis. 01/18/21   Scot Jun, FNP  levocetirizine (XYZAL) 5 MG tablet Take 1 tablet (5 mg total) by mouth every evening. 02/03/21   Scot Jun, FNP  levothyroxine (SYNTHROID, LEVOTHROID) 100 MCG tablet TAKE 1 TABLET BY MOUTH DAILY BEFORE BREAKFAST. 01/24/17   Estill Dooms, NP  promethazine-dextromethorphan (PROMETHAZINE-DM) 6.25-15 MG/5ML syrup Take 5 mLs by mouth 3 (three) times daily as needed for cough. 02/03/21   Scot Jun, FNP  XIIDRA 5 % SOLN INSTILL 1 DROP INTO BOTHKEYES TWICE DAILY. 09/09/20   [provider]    Allergies Patient has no known allergies.   REVIEW OF SYSTEMS  Negative except as noted here or in the History of Present Illness.   PHYSICAL EXAMINATION  Initial Vital Signs Blood pressure (!) 143/91, pulse 66, temperature 98.2 F (36.8 C), temperature source Oral, resp. rate 15, height '5\' 6"'$  (1.676 m), weight 61.2 kg, last menstrual period 11/25/2011, SpO2 99 %.  Examination General: Well-developed, well-nourished female in no acute distress; appearance consistent with age of record HENT: normocephalic; atraumatic Eyes: pupils equal, round and reactive to light; extraocular muscles intact Neck: supple Heart: regular rate and rhythm Lungs: clear to auscultation bilaterally Abdomen: soft; nondistended; nontender; bowel sounds present Extremities: No deformity; full range of motion; pulses normal Neurologic: Awake, alert and oriented; motor function intact in all extremities and symmetric; mild left facial droop Skin: Warm and dry Psychiatric: Tearful   RESULTS  Summary  of this visit's results, reviewed and interpreted by myself:   EKG Interpretation  Date/Time:    Ventricular Rate:    PR Interval:    QRS Duration:   QT Interval:    QTC Calculation:   R Axis:     Text Interpretation:         Laboratory Studies: No results found for this or any previous visit (from the past 24 hour(s)). Imaging Studies: No results found.  ED COURSE and MDM  Nursing notes, initial and subsequent vitals signs, including pulse oximetry, reviewed and interpreted by myself.  Vitals:   04/10/21 2230 04/11/21 0018  BP: (!) 153/85 (!) 143/91  Pulse: 71 66  Resp: 17 15  Temp: 98.2 F (36.8 C)   TempSrc: Oral   SpO2: 100% 99%  Weight: 61.2 kg   Height: '5\' 6"'$  (1.676  m)    Medications  gabapentin (NEURONTIN) capsule 300 mg (300 mg Oral Given 04/11/21 0047)  HYDROcodone-acetaminophen (HYCET) 7.5-325 mg/15 ml solution 15 mL (15 mLs Oral Given 04/11/21 0047)    The patient is currently on Neurontin 300 mg twice daily.  We will increase this to 300 mg 3 times daily.  She was advised that there is a wide therapeutic range of Neurontin and she should discuss with her neurologist the possibility of titrating upwards over the next few weeks.  She is also on carbamazepine but has significant difficulty swallowing the tablets.  We will switch her to a liquid preparation.  We will also try a short course of narcotic pain medication pending follow-up with neurology.  PROCEDURES  Procedures   ED DIAGNOSES     ICD-10-CM   1. Trigeminal neuralgia of left side of face  G50.0          Jarold Macomber, Jenny Reichmann, MD 04/11/21 [phone]

## 2021-04-12 ENCOUNTER — Encounter (HOSPITAL_BASED_OUTPATIENT_CLINIC_OR_DEPARTMENT_OTHER): Payer: Self-pay | Admitting: Urology

## 2021-04-12 ENCOUNTER — Other Ambulatory Visit: Payer: Self-pay

## 2021-04-12 ENCOUNTER — Emergency Department (HOSPITAL_BASED_OUTPATIENT_CLINIC_OR_DEPARTMENT_OTHER)
Admission: EM | Admit: 2021-04-12 | Discharge: 2021-04-12 | Disposition: A | Discharge status: Home / Self Care | Payer: BC Managed Care – PPO | Attending: Emergency Medicine | Admitting: Emergency Medicine

## 2021-04-12 ENCOUNTER — Emergency Department (HOSPITAL_BASED_OUTPATIENT_CLINIC_OR_DEPARTMENT_OTHER): Payer: BC Managed Care – PPO

## 2021-04-12 DIAGNOSIS — Z79899 Other long term (current) drug therapy: Secondary | ICD-10-CM | POA: Diagnosis not present

## 2021-04-12 DIAGNOSIS — G500 Trigeminal neuralgia: Secondary | ICD-10-CM

## 2021-04-12 DIAGNOSIS — E039 Hypothyroidism, unspecified: Secondary | ICD-10-CM | POA: Diagnosis not present

## 2021-04-12 DIAGNOSIS — G501 Atypical facial pain: Secondary | ICD-10-CM | POA: Diagnosis present

## 2021-04-12 LAB — COMPREHENSIVE METABOLIC PANEL
ALT: 27 U/L (ref 0–44)
AST: 33 U/L (ref 15–41)
Albumin: 4.5 g/dL (ref 3.5–5.0)
Alkaline Phosphatase: 114 U/L (ref 38–126)
Anion gap: 11 (ref 5–15)
BUN: 14 mg/dL (ref 6–20)
CO2: 28 mmol/L (ref 22–32)
Calcium: 9.9 mg/dL (ref 8.9–10.3)
Chloride: 100 mmol/L (ref 98–111)
Creatinine, Ser: 0.6 mg/dL (ref 0.44–1.00)
GFR, Estimated: >60 mL/min (ref >60)
Glucose, Bld: 78 mg/dL (ref 70–99)
Potassium: 3.9 mmol/L (ref 3.5–5.1)
Sodium: 139 mmol/L (ref 135–145)
Total Bilirubin: 0.6 mg/dL (ref 0.3–1.2)
Total Protein: 7.3 g/dL (ref 6.5–8.1)

## 2021-04-12 LAB — CBC WITH DIFFERENTIAL/PLATELET
Abs Immature Granulocytes: 0.01 10*3/uL (ref 0.00–0.07)
Basophils Absolute: 0.1 10*3/uL (ref 0.0–0.1)
Basophils Relative: 1 %
Eosinophils Absolute: 0.1 10*3/uL (ref 0.0–0.5)
Eosinophils Relative: 2 %
HCT: 49 % — ABNORMAL HIGH (ref 36.0–46.0)
Hemoglobin: 16.2 g/dL — ABNORMAL HIGH (ref 12.0–15.0)
Immature Granulocytes: 0 %
Lymphocytes Relative: 29 %
Lymphs Abs: 1.4 10*3/uL (ref 0.7–4.0)
MCH: 31.5 pg (ref 26.0–34.0)
MCHC: 33.1 g/dL (ref 30.0–36.0)
MCV: 95.3 fL (ref 80.0–100.0)
Monocytes Absolute: 0.4 10*3/uL (ref 0.1–1.0)
Monocytes Relative: 7 %
Neutro Abs: 3 10*3/uL (ref 1.7–7.7)
Neutrophils Relative %: 61 %
Platelets: 196 10*3/uL (ref 150–400)
RBC: 5.14 MIL/uL — ABNORMAL HIGH (ref 3.87–5.11)
RDW: 12.5 % (ref 11.5–15.5)
WBC: 4.9 10*3/uL (ref 4.0–10.5)
nRBC: 0 % (ref 0.0–0.2)

## 2021-04-12 LAB — SEDIMENTATION RATE: Sed Rate: 2 mm/hr (ref 0–22)

## 2021-04-12 MED ORDER — ONDANSETRON HCL 4 MG/2ML IJ SOLN
4.0000 mg | Freq: Once | INTRAMUSCULAR | Status: AC
  Administered 2021-04-12: 4 mg via INTRAVENOUS
  Filled 2021-04-12: qty 2

## 2021-04-12 MED ORDER — IOHEXOL 350 MG/ML SOLN
75.0000 mL | Freq: Once | INTRAVENOUS | Status: AC | PRN
  Administered 2021-04-12: 75 mL via INTRAVENOUS

## 2021-04-12 MED ORDER — HYDROMORPHONE HCL 1 MG/ML IJ SOLN
1.0000 mg | Freq: Once | INTRAMUSCULAR | Status: AC
  Administered 2021-04-12: 1 mg via INTRAVENOUS
  Filled 2021-04-12: qty 1

## 2021-04-12 MED ORDER — SODIUM CHLORIDE 0.9 % IV BOLUS
1000.0000 mL | Freq: Once | INTRAVENOUS | Status: AC
  Administered 2021-04-12: 1000 mL via INTRAVENOUS

## 2021-04-12 NOTE — ED Triage Notes (Signed)
Sinus pain in left side face, seen on 8/26 for same.  Taking hydrocodone at 1000 no relief.

## 2021-04-12 NOTE — ED Provider Notes (Signed)
Yvette Joyce   CSN: [id number] Arrival date & time: 04/12/21  1529     History Chief Complaint  Patient presents with   Facial Pain    Yvette Joyce is a 61 y.o. female.  The history is provided by the patient.  Headache Location: left side of the face. Radiates to:  Does not radiate Severity currently:  10/10 Severity at highest:  10/10 Onset quality:  Gradual Duration: Started in 2017, resolved for a period of years, and returned 2/22. Timing:  Constant Progression:  Worsening Chronicity:  Chronic Context comment:  Has been diagnosed with trigeminal neuralgia. Neurology is titrating her medications. [facility] referral in place. Went to [facility] yesterday. MRI performed several years ago. Relieved by:  Nothing Worsened by:  Nothing Ineffective treatments:  None tried Associated symptoms: facial pain   Associated symptoms: no abdominal pain, no back pain, no blurred vision, no congestion (recent sinus infection), no cough, no diarrhea, no dizziness, no ear pain, no eye pain, no fever, no focal weakness, no loss of balance, no near-syncope, no neck stiffness, no numbness, no paresthesias, no photophobia, no seizures, no sore throat, no visual change, no vomiting and no weakness       Past Medical History:  Diagnosis Date   Adult attention deficit disorder    Arthritis    Complication of anesthesia    "hard to wake up"   Constipation    Fatigue 03/07/2015   Headache(784.0)    Hematuria 04/12/2013   Hypothyroidism    Postmenopause 03/07/2015   Primary localized osteoarthritis of left knee 08/26/2016   Sleep disturbance 03/07/2015   Snoring    Thyroid disease    Trigeminal neuralgia pain     Patient Active Problem List   Diagnosis Date Noted   Grief reaction 05/27/2017   Fecal occult blood test positive 05/27/2017   Encounter for gynecological examination with Papanicolaou smear of cervix 05/27/2017   Trigger finger  of right thumb 09/02/2016   Primary localized osteoarthritis of left knee 08/26/2016   Trigeminal neuralgia pain    Bilateral carpal tunnel syndrome 06/17/2016   Ganglion cyst 06/17/2016   Fatigue 03/07/2015   Sleep disturbance 03/07/2015   Postmenopause 03/07/2015   Hemorrhoids 11/13/2014   Knee pain, acute 05/01/2014   Left leg weakness 05/01/2014   Hypothyroid 02/28/2014   Dilated cbd, acquired 06/22/2013   Hematuria 04/12/2013    Past Surgical History:  Procedure Laterality Date   ABDOMINAL HYSTERECTOMY     BREAST CYST ASPIRATION  2015   CARPAL TUNNEL RELEASE Right 07/2016   COLONOSCOPY  09/24/2011   [id number] polyps in the ascending colon/diverticula in the sigmoid colon/internal hemorrhoids   cosmetic eye surgery     fibroids removed from uterus     HYSTEROSCOPY WITH D & C  12/03/2011   Procedure: DILATATION AND CURETTAGE /HYSTEROSCOPY;  Surgeon: Jonnie Kind, MD;  Location: [facility];  Service: Gynecology;  Laterality: N/A;   KNEE ARTHROSCOPY W/ DEBRIDEMENT     LAPAROSCOPIC SALPINGO OOPHERECTOMY  07/11/2012   Procedure: LAPAROSCOPIC SALPINGO OOPHORECTOMY;  Surgeon: Jonnie Kind, MD;  Location: [facility];  Service: Gynecology;  Laterality: Bilateral;   LAPAROSCOPIC SUPRACERVICAL HYSTERECTOMY  07/11/2012   Procedure: LAPAROSCOPIC SUPRACERVICAL HYSTERECTOMY;  Surgeon: Jonnie Kind, MD;  Location: [facility];  Service: Gynecology;  Laterality: N/A;   LASIK     NASAL SEPTUM SURGERY     POLYPECTOMY  12/03/2011   Procedure: POLYPECTOMY;  Surgeon: Angelyn Punt  Glo Herring, MD;  Location: [facility];  Service: Gynecology;  Laterality: N/A;  resection of polyp   TOTAL KNEE ARTHROPLASTY Left 09/06/2016   TOTAL KNEE ARTHROPLASTY Left 09/06/2016   Procedure: TOTAL KNEE ARTHROPLASTY;  Surgeon: Elsie Saas, MD;  Location: [facility];  Service: Orthopedics;  Laterality: Left;   TRIGGER FINGER RELEASE  12/2016   VEIN SURGERY     Varicose veins     OB History     Gravida      Para      Term       Preterm      AB      Living  0      SAB      IAB      Ectopic      Multiple      Live Births              Family History  Problem Relation Age of Onset   Colon cancer Maternal Uncle    Cancer Sister    Thyroid disease Sister    Breast cancer Sister 37       Twin Sister   Heart disease Father    Hypertension Father    Stroke Mother    Asthma Mother    Hypertension Mother    Breast cancer Cousin     Social History   Tobacco Use   Smoking status: Never   Smokeless tobacco: Never  Vaping Use   Vaping Use: Never used  Substance Use Topics   Alcohol use: Yes    Alcohol/week: 1.0 standard drink    Types: 1 Shots of liquor per week    Comment: 1-2 times a week   Drug use: No    Home Medications Prior to Admission medications   Medication Sig Start Date End Date Taking? Authorizing Provider  amphetamine-dextroamphetamine (ADDERALL) 20 MG tablet Take 20 mg by mouth 2 (two) times daily.  10/28/17   [provider]  carBAMazepine (TEGRETOL) 100 MG/5ML suspension Take 10 mLs (200 mg total) by mouth in the morning and at bedtime. 04/11/21   Molpus, John, MD  Cholecalciferol (VITAMIN D3) 25 MCG (1000 UT) CAPS Take by mouth.    [provider]  gabapentin (NEURONTIN) 300 MG capsule Take 1 capsule (300 mg total) by mouth 3 (three) times daily. 04/11/21   Molpus, John, MD  HYDROcodone-acetaminophen (HYCET) 7.5-325 mg/15 ml solution Take 15 mLs by mouth every 6 (six) hours as needed for severe pain. 04/11/21   Molpus, John, MD  ipratropium (ATROVENT) 0.03 % nasal spray Place 2 sprays into both nostrils 3 (three) times daily as needed for rhinitis. 01/18/21   Scot Jun, FNP  levocetirizine (XYZAL) 5 MG tablet Take 1 tablet (5 mg total) by mouth every evening. 02/03/21   Scot Jun, FNP  levothyroxine (SYNTHROID, LEVOTHROID) 100 MCG tablet TAKE 1 TABLET BY MOUTH DAILY BEFORE BREAKFAST. 01/24/17   Estill Dooms, NP   promethazine-dextromethorphan (PROMETHAZINE-DM) 6.25-15 MG/5ML syrup Take 5 mLs by mouth 3 (three) times daily as needed for cough. 02/03/21   Scot Jun, FNP  XIIDRA 5 % SOLN INSTILL 1 DROP INTO BOTHKEYES TWICE DAILY. 09/09/20   [provider]    Allergies    Patient has no known allergies.  Review of Systems   Review of Systems  Constitutional:  Negative for chills and fever.  HENT:  Negative for congestion (recent sinus infection), ear pain and sore throat.   Eyes:  Negative for blurred  vision, photophobia, pain and visual disturbance.  Respiratory:  Negative for cough and shortness of breath.   Cardiovascular:  Negative for chest pain, palpitations and near-syncope.  Gastrointestinal:  Negative for abdominal pain, diarrhea and vomiting.  Genitourinary:  Negative for dysuria and hematuria.  Musculoskeletal:  Negative for arthralgias, back pain and neck stiffness.  Skin:  Negative for color change and rash.  Neurological:  Positive for headaches. Negative for dizziness, focal weakness, seizures, syncope, weakness, numbness, paresthesias and loss of balance.  All other systems reviewed and are negative.  Physical Exam Updated Vital Signs BP 124/72 (BP Location: Left Arm)   Pulse 76   Temp 98.2 F (36.8 C) (Oral)   Resp 17   Ht '5\' 6"'$  (1.676 m)   Wt 61.2 kg   LMP 11/25/2011   SpO2 99%   BMI 21.79 kg/m   Physical Exam Vitals and nursing Joyce reviewed.  Constitutional:      Comments: Tearful, jumping from wheelchair periodically  HENT:     Head: Normocephalic and atraumatic.     Comments: No temporal artery TTP No Tinel sign over the trigeminal nerve    Right Ear: Tympanic membrane, ear canal and external ear normal.     Left Ear: Tympanic membrane, ear canal and external ear normal.     Nose: Nose normal.     Mouth/Throat:     Mouth: Mucous membranes are moist.     Pharynx: Oropharynx is clear.     Comments: Normal jaw opening Eyes:     General: No  scleral icterus.    Extraocular Movements: Extraocular movements intact.     Conjunctiva/sclera: Conjunctivae normal.  Pulmonary:     Effort: Pulmonary effort is normal. No respiratory distress.  Musculoskeletal:        General: No deformity or signs of injury.     Cervical back: Normal range of motion. No tenderness.  Lymphadenopathy:     Cervical: No cervical adenopathy.  Skin:    General: Skin is warm and dry.  Neurological:     General: No focal deficit present.     Mental Status: She is alert and oriented to person, place, and time.  Psychiatric:        Mood and Affect: Mood normal.    ED Results / Procedures / Treatments   Labs (all labs ordered are listed, but only abnormal results are displayed) Labs Reviewed  CBC WITH DIFFERENTIAL/PLATELET - Abnormal; Notable for the following components:      Result Value   RBC 5.14 (*)    Hemoglobin 16.2 (*)    HCT 49.0 (*)    All other components within normal limits  COMPREHENSIVE METABOLIC PANEL  SEDIMENTATION RATE    EKG None  Radiology CT Angio Head W or Wo Contrast  Result Date: 04/12/2021 CLINICAL DATA:  CNS vasculitis EXAM: CT ANGIOGRAPHY HEAD TECHNIQUE: Multidetector CT imaging of the head was performed using the standard protocol during bolus administration of intravenous contrast. Multiplanar CT image reconstructions and MIPs were obtained to evaluate the vascular anatomy. CONTRAST:  51m OMNIPAQUE IOHEXOL 350 MG/ML SOLN COMPARISON:  No pertinent prior exam. FINDINGS: CT HEAD Brain: There is no mass, hemorrhage or extra-axial collection. The size and configuration of the ventricles and extra-axial CSF spaces are normal. The brain parenchyma is normal, without acute or chronic infarction. Vascular: No abnormal hyperdensity of the major intracranial arteries or dural venous sinuses. No intracranial atherosclerosis. Skull: The visualized skull base, calvarium and extracranial soft tissues are normal. Sinuses/Orbits:  No fluid  levels or advanced mucosal thickening of the visualized paranasal sinuses. No mastoid or middle ear effusion. The orbits are normal. CTA HEAD POSTERIOR CIRCULATION: --Vertebral arteries: Normal --Inferior cerebellar arteries: Normal. --Basilar artery: Normal. --Superior cerebellar arteries: Normal. --Posterior cerebral arteries: Normal. ANTERIOR CIRCULATION: --Intracranial internal carotid arteries: Normal. --Anterior cerebral arteries (ACA): Normal. --Middle cerebral arteries (MCA): Normal. ANATOMIC VARIANTS: None Review of the MIP images confirms the above findings. IMPRESSION: Normal CTA of the head. Electronically Signed   By: Ulyses Jarred M.D.   On: 04/12/2021 19:05    Procedures Procedures   Medications Ordered in ED Medications - No data to display  ED Course  I have reviewed the triage vital signs and the nursing notes.  Pertinent labs & imaging results that were available during my care of the patient were reviewed by me and considered in my medical decision making (see chart for details).    MDM Rules/Calculators/A&P                           Yvette Joyce presents with severe facial pain.  This is a chronic situation, and she is established with neurology.  She also was seen in the emergency department yesterday with recommendations to increase one of her medications.  Today she was evaluated for alternative acute pathology such as temporal arteritis, vascular dissection, vasculitis, other acute intracranial pathology.  Physical exam does not suggest an infection of the ear or throat.  I recommended that she continue to work with her neurologist to discover the best treatment plan.  No further recommendations were made for pain medication from the emergency department.  Pain is chronic and persistent, she could consider pain medicine referral. Final Clinical Impression(s) / ED Diagnoses Final diagnoses:  Trigeminal neuralgia    Rx / DC Orders ED Discharge Orders     None         Arnaldo Natal, MD 04/12/21 [phone]

## 2021-04-12 NOTE — ED Notes (Signed)
Pt verbalizes understanding of discharge instructions. Opportunity for questioning and answers were provided. Armand removed by staff, pt discharged from ED to home. Educated to f/u with neuro & PCP for pain management.

## 2021-04-13 NOTE — Telephone Encounter (Signed)
Can we look into expediting the referral to [facility]?

## 2021-04-13 NOTE — Telephone Encounter (Signed)
Patient's husband called on-call physician multiple times during the weekend for worsening trigeminal pain,  I have advised her to increase gabapentin to higher dose, was on 100 mg 3 times a day, I have suggested 300 mg 3 times a day, also increase Tegretol suspension from 100 mg twice a day to 300 mg twice a day  Patient's husband called multiple times, complains that she has difficulty swallowing, went to emergency room,  Hope to expedite the referral to [facility]

## 2021-04-13 NOTE — Telephone Encounter (Signed)
Pt's husband, Adline Mango (on [place]) called, she is in a lot of pain in left lower jaw. Medication is not working. Would like a call from the nurse.

## 2021-04-13 NOTE — Telephone Encounter (Signed)
I called [facility] trying to expediate appt for pt.  She is scheduled at this point 05-06-21.  Speaking with Sarah with Dr. Frederich Balding office [phone].  (Over the weekend multiple calls to oncall doctor and ED for difficulty swallowing).  They said that no appt sooner, did place on waitlist. These appt are coordinated with radiation.   I called patient and  spoke to husband.  She was having pain every 15 seconds into her jaw which she was not able to eat or drink over the weekend, she went to ED calling, [place] multiple times she continues on her medication as ordered gabapentin '300mg'$  3 times a day and carbamazepine for 200 mg twice a day.  She also has hydrocodone suspension to use as needed.  It sounds like none of that has really helped.  Today went to her dentist and was given a shot of Novocaine which has helped enough for her to sleep this afternoon.  FMLA is complete MM/NP to sign.    I gave appointment to her husband will call to verify time for that.  Also relayed that she is on wait list.  He he may call [phone] to see if any cancellations. Any other recommendations.  Prednisone??

## 2021-04-14 MED ORDER — CARBAMAZEPINE 100 MG/5ML PO SUSP: 200.0000 mg | Freq: Three times a day (TID) | ORAL | 1 refills | Status: DC

## 2021-04-14 NOTE — Addendum Note (Signed)
Addended by: Star Age on: 04/14/2021 06:58 AM   Modules accepted: Orders

## 2021-04-14 NOTE — Telephone Encounter (Signed)
Thanks for the update.  We can increase the carbamazepine suspension to 200 mg 3 times a day.  Prescription was updated and sent to her pharmacy on file.  Please notify patient or husband.

## 2021-04-14 NOTE — Telephone Encounter (Signed)
Kristen sent the referral on 8/23. Please see her separate telephone note. She sent patient a MyChart message with the telephone number. [phone].

## 2021-04-16 ENCOUNTER — Telehealth: Payer: Self-pay | Admitting: Neurology

## 2021-04-16 ENCOUNTER — Telehealth: Payer: Self-pay | Admitting: *Deleted

## 2021-04-16 NOTE — Telephone Encounter (Signed)
Pt FMLA [place] form faxed on 04/16/21

## 2021-04-16 NOTE — Telephone Encounter (Signed)
Patient called with severe pain TGN. Said she cannot wait, needs rx for pain and to sleep. I called back, it rang several times then went to voice mail. I left a message to call back if needed.

## 2021-04-21 ENCOUNTER — Telehealth: Payer: Self-pay | Admitting: Adult Health

## 2021-04-21 NOTE — Telephone Encounter (Signed)
Please try to call patient back and advised her to proceed to the emergency room, I am not quite sure where we stand with her referral to [facility] neurosurgery, Dr. Arlan Organ.  Please see if we can expedite referral.  In the meantime, please also encourage patient to talk to her primary care about a referral to pain management. We can also try to increase the gabapentin to 400 mg 3 times daily if she is agreeable for now. If she is okay with increasing the gabapentin, please change prescription to 400 mg strength 1 pill 3 times daily.

## 2021-04-21 NOTE — Telephone Encounter (Signed)
Dorian Pod from [facility] is asking for a call from [place], [place], she can be reached at [phone]

## 2021-04-21 NOTE — Telephone Encounter (Signed)
I spoke with Yvette Joyce today. See other phone note for documentation. Essentially, pt is on wait list. No sooner opening at this time.

## 2021-04-21 NOTE — Telephone Encounter (Signed)
I spoke with Dorian Pod at [facility] neurosurgery.  She stated that unfortunately there is nothing sooner than 05/06/21 between Dr. Arlan Organ and the radiation oncologist.  They have to coordinate both appointments for initial consult together.  They do have the patient on the wait list and they understand the severity of her symptoms and the multiple visits to the ER in the last few weeks.  I called the patient and I discussed Dr. Guadelupe Sabin message with her.  The patient stated for now she will leave her gabapentin as is.  She was so desperate on Friday that she went had a root canal done.  She thinks it might actually be helping a little bit so far.  She will see her family physician on Friday and will ask about a pain management referral.  She also stated she had talked to [employer] today and they had not received the FMLA papers.  She asked if he could please fax in 1 more time.  She stated if she loses her job she will not be able to have the surgery. The patient verbalized appreciation for the call.

## 2021-04-23 ENCOUNTER — Other Ambulatory Visit: Payer: Self-pay

## 2021-04-23 ENCOUNTER — Encounter (HOSPITAL_BASED_OUTPATIENT_CLINIC_OR_DEPARTMENT_OTHER): Payer: Self-pay | Admitting: Emergency Medicine

## 2021-04-23 ENCOUNTER — Ambulatory Visit
Admission: EM | Admit: 2021-04-23 | Discharge: 2021-04-23 | Disposition: A | Discharge status: Home / Self Care | Payer: BC Managed Care – PPO

## 2021-04-23 ENCOUNTER — Emergency Department (HOSPITAL_BASED_OUTPATIENT_CLINIC_OR_DEPARTMENT_OTHER)
Admission: EM | Admit: 2021-04-23 | Discharge: 2021-04-23 | Disposition: A | Discharge status: Home / Self Care | Payer: BC Managed Care – PPO | Attending: Emergency Medicine | Admitting: Emergency Medicine

## 2021-04-23 ENCOUNTER — Encounter: Payer: Self-pay | Admitting: Emergency Medicine

## 2021-04-23 DIAGNOSIS — Z96652 Presence of left artificial knee joint: Secondary | ICD-10-CM | POA: Insufficient documentation

## 2021-04-23 DIAGNOSIS — E039 Hypothyroidism, unspecified: Secondary | ICD-10-CM | POA: Diagnosis not present

## 2021-04-23 DIAGNOSIS — Z79899 Other long term (current) drug therapy: Secondary | ICD-10-CM | POA: Diagnosis not present

## 2021-04-23 DIAGNOSIS — G500 Trigeminal neuralgia: Secondary | ICD-10-CM | POA: Diagnosis not present

## 2021-04-23 DIAGNOSIS — R519 Headache, unspecified: Secondary | ICD-10-CM | POA: Diagnosis present

## 2021-04-23 MED ORDER — HYDROMORPHONE HCL 1 MG/ML IJ SOLN
1.0000 mg | Freq: Once | INTRAMUSCULAR | Status: AC
  Administered 2021-04-23: 1 mg via INTRAVENOUS
  Filled 2021-04-23: qty 1

## 2021-04-23 MED ORDER — HYDROCODONE-ACETAMINOPHEN 5-325 MG PO TABS: 1.0000 | ORAL_TABLET | ORAL | 0 refills | Status: DC | PRN

## 2021-04-23 MED ORDER — SODIUM CHLORIDE 0.9 % IV BOLUS
1000.0000 mL | Freq: Once | INTRAVENOUS | Status: AC
  Administered 2021-04-23: 1000 mL via INTRAVENOUS

## 2021-04-23 NOTE — ED Provider Notes (Signed)
Patient care was taken over from Dr. Pearline Cables.  She has a history of trigeminal neuralgia since February.  She is here for exacerbation of the pain.  She is followed by neurology as well as her PCP.  She has been referred to pain management and she has an appointment with a neurosurgeon at [facility] later this month for possible gamma knife treatment.  Her pain has been managed in the ED with a dose of Dilaudid that was given by Dr. Pearline Cables.  She request a short course of pain medication until she can follow-up with her doctors.  She actually has an appointment tomorrow with her PCP.  She says that she is out of her hydrocodone.  We will give her a prescription for 6 tablets.   Malvin Johns, MD 04/23/21 [phone]

## 2021-04-23 NOTE — ED Triage Notes (Signed)
Pt arrives pov with c/o left sided jaw and ear pain worsening overnight.  Recently diagnosed with trigeminal neuralgia.  States has been to [facility], [facility] and a dentist.   Has taken lorazepam with some relief. Pt also c/o constipation r/t pain medication. Endorses hydrocodone at 0800. Pt crying in triage

## 2021-04-23 NOTE — ED Triage Notes (Signed)
Left sided jaw and ear pain since this morning.  Recently diagnosed with trigeminal neuralgia.  States has been to [facility], [facility] [employer] and a dentist.   Has taken lorazepam with some relief.

## 2021-04-23 NOTE — ED Provider Notes (Signed)
[facility] EMERGENCY DEPT Provider Note   CSN: [id number] Arrival date & time: 04/23/21  1302     History Chief Complaint  Patient presents with   Jaw Pain    Yvette Joyce is a 60 y.o. female.  Patient is a 60 yo female with pmh of trigeminal neuralgia presenting for facial pain. Patient admits to left sided facial pain starting in the cheek and radiating into the temple. Pain unrelieved by home Gabapentin and hydrocodone. Denies any recent falls/injuries, rashes, abnormal dentition, or URI symptoms.    The history is provided by the patient. No language interpreter was used.      Past Medical History:  Diagnosis Date   Adult attention deficit disorder    Arthritis    Complication of anesthesia    "hard to wake up"   Constipation    Fatigue 03/07/2015   Headache(784.0)    Hematuria 04/12/2013   Hypothyroidism    Postmenopause 03/07/2015   Primary localized osteoarthritis of left knee 08/26/2016   Sleep disturbance 03/07/2015   Snoring    Thyroid disease    Trigeminal neuralgia pain     Patient Active Problem List   Diagnosis Date Noted   Grief reaction 05/27/2017   Fecal occult blood test positive 05/27/2017   Encounter for gynecological examination with Papanicolaou smear of cervix 05/27/2017   Trigger finger of right thumb 09/02/2016   Primary localized osteoarthritis of left knee 08/26/2016   Trigeminal neuralgia pain    Bilateral carpal tunnel syndrome 06/17/2016   Ganglion cyst 06/17/2016   Fatigue 03/07/2015   Sleep disturbance 03/07/2015   Postmenopause 03/07/2015   Hemorrhoids 11/13/2014   Knee pain, acute 05/01/2014   Left leg weakness 05/01/2014   Hypothyroid 02/28/2014   Dilated cbd, acquired 06/22/2013   Hematuria 04/12/2013    Past Surgical History:  Procedure Laterality Date   ABDOMINAL HYSTERECTOMY     BREAST CYST ASPIRATION  2015   CARPAL TUNNEL RELEASE Right 07/2016   COLONOSCOPY  09/24/2011   [id number] polyps in the  ascending colon/diverticula in the sigmoid colon/internal hemorrhoids   cosmetic eye surgery     fibroids removed from uterus     HYSTEROSCOPY WITH D & C  12/03/2011   Procedure: DILATATION AND CURETTAGE /HYSTEROSCOPY;  Surgeon: Jonnie Kind, MD;  Location: [facility];  Service: Gynecology;  Laterality: N/A;   KNEE ARTHROSCOPY W/ DEBRIDEMENT     LAPAROSCOPIC SALPINGO OOPHERECTOMY  07/11/2012   Procedure: LAPAROSCOPIC SALPINGO OOPHORECTOMY;  Surgeon: Jonnie Kind, MD;  Location: [facility];  Service: Gynecology;  Laterality: Bilateral;   LAPAROSCOPIC SUPRACERVICAL HYSTERECTOMY  07/11/2012   Procedure: LAPAROSCOPIC SUPRACERVICAL HYSTERECTOMY;  Surgeon: Jonnie Kind, MD;  Location: [facility];  Service: Gynecology;  Laterality: N/A;   LASIK     NASAL SEPTUM SURGERY     POLYPECTOMY  12/03/2011   Procedure: POLYPECTOMY;  Surgeon: Jonnie Kind, MD;  Location: [facility];  Service: Gynecology;  Laterality: N/A;  resection of polyp   TOTAL KNEE ARTHROPLASTY Left 09/06/2016   TOTAL KNEE ARTHROPLASTY Left 09/06/2016   Procedure: TOTAL KNEE ARTHROPLASTY;  Surgeon: Elsie Saas, MD;  Location: [facility];  Service: Orthopedics;  Laterality: Left;   TRIGGER FINGER RELEASE  12/2016   VEIN SURGERY     Varicose veins     OB History     Gravida      Para      Term      Preterm      AB  Living  0      SAB      IAB      Ectopic      Multiple      Live Births              Family History  Problem Relation Age of Onset   Colon cancer Maternal Uncle    Cancer Sister    Thyroid disease Sister    Breast cancer Sister 71       Twin Sister   Heart disease Father    Hypertension Father    Stroke Mother    Asthma Mother    Hypertension Mother    Breast cancer Cousin     Social History   Tobacco Use   Smoking status: Never   Smokeless tobacco: Never  Vaping Use   Vaping Use: Never used  Substance Use Topics   Alcohol use: Yes    Alcohol/week: 1.0 standard drink    Types: 1  Shots of liquor per week    Comment: 1-2 times a week   Drug use: No    Home Medications Prior to Admission medications   Medication Sig Start Date End Date Taking? Authorizing Provider  amphetamine-dextroamphetamine (ADDERALL) 20 MG tablet Take 20 mg by mouth 2 (two) times daily.  10/28/17  Yes [provider]  carBAMazepine (TEGRETOL) 100 MG/5ML suspension Take 10 mLs (200 mg total) by mouth 3 (three) times daily. 04/14/21  Yes Star Age, MD  gabapentin (NEURONTIN) 300 MG capsule Take 1 capsule (300 mg total) by mouth 3 (three) times daily. 04/11/21  Yes Molpus, John, MD  HYDROcodone-acetaminophen (HYCET) 7.5-325 mg/15 ml solution Take 15 mLs by mouth every 6 (six) hours as needed for severe pain. 04/11/21  Yes Molpus, John, MD  levothyroxine (SYNTHROID, LEVOTHROID) 100 MCG tablet TAKE 1 TABLET BY MOUTH DAILY BEFORE BREAKFAST. 01/24/17  Yes Estill Dooms, NP  XIIDRA 5 % SOLN INSTILL 1 DROP INTO BOTHKEYES TWICE DAILY. 09/09/20  Yes [provider]  ipratropium (ATROVENT) 0.03 % nasal spray Place 2 sprays into both nostrils 3 (three) times daily as needed for rhinitis. Patient not taking: Reported on 04/23/2021 01/18/21   Scot Jun, FNP  levocetirizine (XYZAL) 5 MG tablet Take 1 tablet (5 mg total) by mouth every evening. Patient not taking: Reported on 04/23/2021 02/03/21   Scot Jun, FNP  promethazine-dextromethorphan (PROMETHAZINE-DM) 6.25-15 MG/5ML syrup Take 5 mLs by mouth 3 (three) times daily as needed for cough. Patient not taking: Reported on 04/23/2021 02/03/21   Scot Jun, FNP    Allergies    Patient has no known allergies.  Review of Systems   Review of Systems  Constitutional:  Negative for chills and fever.  HENT:  Negative for ear pain and sore throat.   Eyes:  Negative for pain and visual disturbance.  Respiratory:  Negative for cough and shortness of breath.   Cardiovascular:  Negative for chest pain and palpitations.   Gastrointestinal:  Negative for abdominal pain and vomiting.  Genitourinary:  Negative for dysuria and hematuria.  Musculoskeletal:  Negative for arthralgias and back pain.  Skin:  Negative for color change and rash.  Neurological:  Negative for seizures and syncope.       Facial pain   All other systems reviewed and are negative.  Physical Exam Updated Vital Signs BP (!) 150/94 (BP Location: Right Arm)   Pulse 73   Temp 98.4 F (36.9 C) (Oral)   Resp 18  Ht '5\' 6"'$  (1.676 m)   Wt 59 kg   LMP 11/25/2011   SpO2 98%   BMI 20.98 kg/m   Physical Exam Vitals and nursing note reviewed.  Constitutional:      General: She is not in acute distress.    Appearance: She is well-developed.  HENT:     Head: Normocephalic and atraumatic.     Mouth/Throat:     Dentition: Normal dentition. No dental tenderness.  Eyes:     Conjunctiva/sclera: Conjunctivae normal.  Cardiovascular:     Rate and Rhythm: Normal rate and regular rhythm.     Heart sounds: No murmur heard. Pulmonary:     Effort: Pulmonary effort is normal. No respiratory distress.     Breath sounds: Normal breath sounds.  Abdominal:     Palpations: Abdomen is soft.     Tenderness: There is no abdominal tenderness.  Musculoskeletal:     Cervical back: Neck supple.  Skin:    General: Skin is warm and dry.  Neurological:     Mental Status: She is alert.     Cranial Nerves: Cranial nerves are intact.     Sensory: Sensation is intact.     Motor: Motor function is intact.    ED Results / Procedures / Treatments   Labs (all labs ordered are listed, but only abnormal results are displayed) Labs Reviewed - No data to display  EKG None  Radiology No results found.  Procedures Procedures   Medications Ordered in ED Medications  sodium chloride 0.9 % bolus 1,000 mL (has no administration in time range)  HYDROmorphone (DILAUDID) injection 1 mg (has no administration in time range)    ED Course  I have reviewed  the triage vital signs and the nursing notes.  Pertinent labs & imaging results that were available during my care of the patient were reviewed by me and considered in my medical decision making (see chart for details).    MDM Rules/Calculators/A&P                         2:23 PM 60 yo female with pmh of trigeminal neuralgia presenting for facial pain. Pt Aox3, afebrile, with stable vitals.  -No signs/symptoms of infection -No motor/sensory deficits -No abnormal dentition. -Facial pain likely secondary to trigeminal neuralgia. Medication given in ED.   Pt signed out to oncoming physician while awaiting pain control.    Final Clinical Impression(s) / ED Diagnoses Final diagnoses:  Trigeminal neuralgia    Rx / DC Orders ED Discharge Orders     None        Lianne Cure, DO [id number]

## 2021-05-06 ENCOUNTER — Ambulatory Visit: Payer: BC Managed Care – PPO

## 2021-06-03 ENCOUNTER — Ambulatory Visit
Admission: RE | Admit: 2021-06-03 | Discharge: 2021-06-03 | Disposition: A | Discharge status: Home / Self Care | Payer: BC Managed Care – PPO | Source: Ambulatory Visit | Attending: Internal Medicine | Admitting: Internal Medicine

## 2021-06-03 ENCOUNTER — Other Ambulatory Visit: Payer: Self-pay

## 2021-06-03 DIAGNOSIS — Z1231 Encounter for screening mammogram for malignant neoplasm of breast: Secondary | ICD-10-CM

## 2021-06-09 ENCOUNTER — Other Ambulatory Visit: Payer: Self-pay | Admitting: Internal Medicine

## 2021-06-09 DIAGNOSIS — R928 Other abnormal and inconclusive findings on diagnostic imaging of breast: Secondary | ICD-10-CM

## 2021-06-26 ENCOUNTER — Other Ambulatory Visit: Payer: Self-pay

## 2021-06-26 ENCOUNTER — Encounter: Payer: Self-pay | Admitting: Podiatry

## 2021-06-26 ENCOUNTER — Ambulatory Visit (INDEPENDENT_AMBULATORY_CARE_PROVIDER_SITE_OTHER): Payer: BC Managed Care – PPO | Admitting: Podiatry

## 2021-06-26 DIAGNOSIS — L603 Nail dystrophy: Secondary | ICD-10-CM | POA: Diagnosis not present

## 2021-06-26 DIAGNOSIS — L600 Ingrowing nail: Secondary | ICD-10-CM

## 2021-06-26 NOTE — Progress Notes (Signed)
Subjective:  Patient ID: Yvette Joyce, female    DOB: 1961/03/06,  MRN: [id number]  Chief Complaint  Patient presents with   Nail Problem    Bilateral hallux nail discoloration possible nail removal     60 y.o. female presents with the above complaint.  Patient presents with complaint bilateral onychodystrophy/nail dystrophy.  Patient states that she has had injury to them in the past and has never grown the same.  She is to wear steel toe boots which made them worse.  She would like to have them removed she has had them removed in the past.  Pain scale is 5 out of 10 dull achy in nature hurts with certain type of shoes.  She denies any other acute complaints.   Review of Systems: Negative except as noted in the HPI. Denies N/V/F/Ch.  Past Medical History:  Diagnosis Date   Adult attention deficit disorder    Arthritis    Complication of anesthesia    "hard to wake up"   Constipation    Fatigue 03/07/2015   Headache(784.0)    Hematuria 04/12/2013   Hypothyroidism    Postmenopause 03/07/2015   Primary localized osteoarthritis of left knee 08/26/2016   Sleep disturbance 03/07/2015   Snoring    Thyroid disease    Trigeminal neuralgia pain     Current Outpatient Medications:    amphetamine-dextroamphetamine (ADDERALL) 20 MG tablet, Take 20 mg by mouth 2 (two) times daily. , Disp: , Rfl:    carBAMazepine (TEGRETOL) 100 MG/5ML suspension, Take 10 mLs (200 mg total) by mouth 3 (three) times daily., Disp: 900 mL, Rfl: 1   gabapentin (NEURONTIN) 300 MG capsule, Take 1 capsule (300 mg total) by mouth 3 (three) times daily., Disp: 40 capsule, Rfl: 0   HYDROcodone-acetaminophen (NORCO/VICODIN) 5-325 MG tablet, Take 1 tablet by mouth every 4 (four) hours as needed., Disp: 6 tablet, Rfl: 0   ipratropium (ATROVENT) 0.03 % nasal spray, Place 2 sprays into both nostrils 3 (three) times daily as needed for rhinitis. (Patient not taking: Reported on 04/23/2021), Disp: 30 mL, Rfl: 0   levocetirizine  (XYZAL) 5 MG tablet, Take 1 tablet (5 mg total) by mouth every evening. (Patient not taking: Reported on 04/23/2021), Disp: 90 tablet, Rfl: 0   levothyroxine (SYNTHROID, LEVOTHROID) 100 MCG tablet, TAKE 1 TABLET BY MOUTH DAILY BEFORE BREAKFAST., Disp: 30 tablet, Rfl: 2   promethazine-dextromethorphan (PROMETHAZINE-DM) 6.25-15 MG/5ML syrup, Take 5 mLs by mouth 3 (three) times daily as needed for cough. (Patient not taking: Reported on 04/23/2021), Disp: 140 mL, Rfl: 0   XIIDRA 5 % SOLN, INSTILL 1 DROP INTO BOTHKEYES TWICE DAILY., Disp: , Rfl:   Social History   Tobacco Use  Smoking Status Never  Smokeless Tobacco Never    No Known Allergies Objective:  There were no vitals filed for this visit. There is no height or weight on file to calculate BMI. Constitutional Well developed. Well nourished.  Vascular Dorsalis pedis pulses palpable bilaterally. Posterior tibial pulses palpable bilaterally. Capillary refill normal to all digits.  No cyanosis or clubbing noted. Pedal hair growth normal.  Neurologic Normal speech. Oriented to person, place, and time. Epicritic sensation to light touch grossly present bilaterally.  Dermatologic Pain on palpation of the entire/total nail on 1st digit of the bilaterally No other open wounds. No skin lesions.  Orthopedic: Normal joint ROM without pain or crepitus bilaterally. No visible deformities. No bony tenderness.   Radiographs: None Assessment:   1. Ingrown toenail of right foot  2. Ingrown left big toenail   3. Onychodystrophy    Plan:  Patient was evaluated and treated and all questions answered.  Nail contusion/dystrophy hallux, bilaterally with underlying ingrown -Patient elects to proceed with minor surgery to remove entire toenail today. Consent reviewed and signed by patient. -Entire/total nail excised. See procedure note. -Educated on post-procedure care including soaking. Written instructions provided and reviewed. -Patient to  follow up in 2 weeks for nail check.  Procedure: Excision of entire/total nail  Location: Bilateral 1st toe digit Anesthesia: Lidocaine 1% plain; 1.5 mL and Marcaine 0.5% plain; 1.5 mL, digital block. Skin Prep: Betadine. Dressing: Silvadene; telfa; dry, sterile, compression dressing. Technique: Following skin prep, the toe was exsanguinated and a tourniquet was secured at the base of the toe. The affected nail border was freed and excised. The tourniquet was then removed and sterile dressing applied. Disposition: Patient tolerated procedure well. Patient to return in 2 weeks for follow-up.   No follow-ups on file.

## 2021-06-29 ENCOUNTER — Ambulatory Visit
Admission: RE | Admit: 2021-06-29 | Discharge: 2021-06-29 | Disposition: A | Discharge status: Home / Self Care | Payer: BC Managed Care – PPO | Source: Ambulatory Visit | Attending: Internal Medicine | Admitting: Internal Medicine

## 2021-06-29 ENCOUNTER — Other Ambulatory Visit: Payer: Self-pay | Admitting: Internal Medicine

## 2021-06-29 ENCOUNTER — Other Ambulatory Visit: Payer: Self-pay

## 2021-06-29 DIAGNOSIS — R928 Other abnormal and inconclusive findings on diagnostic imaging of breast: Secondary | ICD-10-CM

## 2021-07-01 DIAGNOSIS — Z0289 Encounter for other administrative examinations: Secondary | ICD-10-CM

## 2021-07-08 ENCOUNTER — Telehealth: Payer: Self-pay | Admitting: *Deleted

## 2021-07-08 NOTE — Telephone Encounter (Signed)
LMVM for pt to return call about FMLA form, First time filled out 04-16-21 for 2 months. 1-2 x per month (one day per episode).  Received form or completion.  Next appt with [place] 10-06-2021.  Had [facility] 06-16-2021 return to work 06-19-2021, next gamma knife f/u 10-2021.

## 2021-07-15 NOTE — Telephone Encounter (Signed)
I called pt, she had not had time to call due to work schedule, conflicts with our time open.  She said that she is doing well since her surgery.  Noted per Dr. Arlan Organ, that flares could still happen 2 months out.  She is taking carbamazepine 100mg  po bid (weaning herself off) and has taken no gabapentin (although has just in case).  She has been doing well.  (Back to all normal activities-eating, sleeping on that side).  It is great.  She feels that her dr appts in Feb 2023 with [place] and march 2023 with Dr. Arlan Organ that her FMLA is still needed (but also just in case of flare).  I have FMLA form to be completed.

## 2021-07-16 NOTE — Telephone Encounter (Signed)
FMLA form completed.  To MM/NP for signature.

## 2021-07-24 ENCOUNTER — Ambulatory Visit
Admission: EM | Admit: 2021-07-24 | Discharge: 2021-07-24 | Disposition: A | Discharge status: Home / Self Care | Payer: BC Managed Care – PPO | Attending: Family Medicine | Admitting: Family Medicine

## 2021-07-24 ENCOUNTER — Other Ambulatory Visit: Payer: Self-pay

## 2021-07-24 DIAGNOSIS — J069 Acute upper respiratory infection, unspecified: Secondary | ICD-10-CM | POA: Diagnosis not present

## 2021-07-24 DIAGNOSIS — Z20828 Contact with and (suspected) exposure to other viral communicable diseases: Secondary | ICD-10-CM

## 2021-07-24 MED ORDER — PROMETHAZINE-DM 6.25-15 MG/5ML PO SYRP: 5.0000 mL | ORAL_SOLUTION | Freq: Four times a day (QID) | ORAL | 0 refills | Status: DC | PRN

## 2021-07-24 MED ORDER — OSELTAMIVIR PHOSPHATE 75 MG PO CAPS: 75.0000 mg | ORAL_CAPSULE | Freq: Two times a day (BID) | ORAL | 0 refills | Status: DC

## 2021-07-24 NOTE — ED Triage Notes (Signed)
Patient states she has a cold or allergies that started about 2 days ago.   She states she is sneezing and coughing. Her eyes are killing her and she is stuffy.   She states she took Advil and drinking vitamin C but no relief.  May have been exposed at work.   Denies Fever

## 2021-07-24 NOTE — ED Provider Notes (Signed)
[facility]    CSN: [id number] Arrival date & time: 07/24/21  1654      History   Chief Complaint No chief complaint on file.   HPI Yvette Joyce is a 60 y.o. female.   Presenting today with a 2-day history of progressively worsening sneezing, coughing, congestion, eye pressure, cough, sore throat.  She is now also having body aches, chills, fatigue.  She denies known fever, chest pain, shortness of breath, abdominal pain, nausea vomiting and diarrhea.  Took Advil and vitamin C with no relief.  Several sick contacts at work recently.  No known pertinent chronic medical problems.   Past Medical History:  Diagnosis Date   Adult attention deficit disorder    Arthritis    Complication of anesthesia    "hard to wake up"   Constipation    Fatigue 03/07/2015   Headache(784.0)    Hematuria 04/12/2013   Hypothyroidism    Postmenopause 03/07/2015   Primary localized osteoarthritis of left knee 08/26/2016   Sleep disturbance 03/07/2015   Snoring    Thyroid disease    Trigeminal neuralgia pain     Patient Active Problem List   Diagnosis Date Noted   Grief reaction 05/27/2017   Fecal occult blood test positive 05/27/2017   Encounter for gynecological examination with Papanicolaou smear of cervix 05/27/2017   Trigger finger of right thumb 09/02/2016   Primary localized osteoarthritis of left knee 08/26/2016   Trigeminal neuralgia pain    Bilateral carpal tunnel syndrome 06/17/2016   Ganglion cyst 06/17/2016   Fatigue 03/07/2015   Sleep disturbance 03/07/2015   Postmenopause 03/07/2015   Hemorrhoids 11/13/2014   Knee pain, acute 05/01/2014   Left leg weakness 05/01/2014   Hypothyroid 02/28/2014   Dilated cbd, acquired 06/22/2013   Hematuria 04/12/2013    Past Surgical History:  Procedure Laterality Date   ABDOMINAL HYSTERECTOMY     BREAST CYST ASPIRATION  2015   CARPAL TUNNEL RELEASE Right 07/2016   COLONOSCOPY  09/24/2011   HTD:[id number] polyps in the  ascending colon/diverticula in the sigmoid colon/internal hemorrhoids   cosmetic eye surgery     fibroids removed from uterus     HYSTEROSCOPY WITH D & C  12/03/2011   Procedure: DILATATION AND CURETTAGE /HYSTEROSCOPY;  Surgeon: Jonnie Kind, MD;  Location: [facility];  Service: Gynecology;  Laterality: N/A;   KNEE ARTHROSCOPY W/ DEBRIDEMENT     LAPAROSCOPIC SALPINGO OOPHERECTOMY  07/11/2012   Procedure: LAPAROSCOPIC SALPINGO OOPHORECTOMY;  Surgeon: Jonnie Kind, MD;  Location: [facility];  Service: Gynecology;  Laterality: Bilateral;   LAPAROSCOPIC SUPRACERVICAL HYSTERECTOMY  07/11/2012   Procedure: LAPAROSCOPIC SUPRACERVICAL HYSTERECTOMY;  Surgeon: Jonnie Kind, MD;  Location: [facility];  Service: Gynecology;  Laterality: N/A;   LASIK     NASAL SEPTUM SURGERY     POLYPECTOMY  12/03/2011   Procedure: POLYPECTOMY;  Surgeon: Jonnie Kind, MD;  Location: [facility];  Service: Gynecology;  Laterality: N/A;  resection of polyp   TOTAL KNEE ARTHROPLASTY Left 09/06/2016   TOTAL KNEE ARTHROPLASTY Left 09/06/2016   Procedure: TOTAL KNEE ARTHROPLASTY;  Surgeon: Elsie Saas, MD;  Location: [place];  Service: Orthopedics;  Laterality: Left;   TRIGGER FINGER RELEASE  12/2016   VEIN SURGERY     Varicose veins    OB History     Gravida      Para      Term      Preterm      AB  Living  0      SAB      IAB      Ectopic      Multiple      Live Births               Home Medications    Prior to Admission medications   Medication Sig Start Date End Date Taking? Authorizing Provider  oseltamivir (TAMIFLU) 75 MG capsule Take 1 capsule (75 mg total) by mouth every 12 (twelve) hours. 07/24/21  Yes Volney American, PA-C  promethazine-dextromethorphan (PROMETHAZINE-DM) 6.25-15 MG/5ML syrup Take 5 mLs by mouth 4 (four) times daily as needed. 07/24/21  Yes Volney American, PA-C  amphetamine-dextroamphetamine (ADDERALL) 20 MG tablet Take 20 mg by mouth 2 (two) times daily.   10/28/17   [provider]  carBAMazepine (TEGRETOL) 100 MG/5ML suspension Take 10 mLs (200 mg total) by mouth 3 (three) times daily. 04/14/21   Star Age, MD  gabapentin (NEURONTIN) 300 MG capsule Take 1 capsule (300 mg total) by mouth 3 (three) times daily. 04/11/21   Molpus, John, MD  HYDROcodone-acetaminophen (NORCO/VICODIN) 5-325 MG tablet Take 1 tablet by mouth every 4 (four) hours as needed. 04/23/21   Malvin Johns, MD  ipratropium (ATROVENT) 0.03 % nasal spray Place 2 sprays into both nostrils 3 (three) times daily as needed for rhinitis. Patient not taking: Reported on 04/23/2021 01/18/21   Scot Jun, FNP  levocetirizine (XYZAL) 5 MG tablet Take 1 tablet (5 mg total) by mouth every evening. Patient not taking: Reported on 04/23/2021 02/03/21   Scot Jun, FNP  levothyroxine (SYNTHROID, LEVOTHROID) 100 MCG tablet TAKE 1 TABLET BY MOUTH DAILY BEFORE BREAKFAST. 01/24/17   Estill Dooms, NP  promethazine-dextromethorphan (PROMETHAZINE-DM) 6.25-15 MG/5ML syrup Take 5 mLs by mouth 3 (three) times daily as needed for cough. Patient not taking: Reported on 04/23/2021 02/03/21   Scot Jun, FNP  XIIDRA 5 % SOLN INSTILL 1 DROP INTO BOTHKEYES TWICE DAILY. 09/09/20   [provider]    Family History Family History  Problem Relation Age of Onset   Colon cancer Maternal Uncle    Cancer Sister    Thyroid disease Sister    Breast cancer Sister 31       Twin Sister   Heart disease Father    Hypertension Father    Stroke Mother    Asthma Mother    Hypertension Mother    Breast cancer Cousin     Social History Social History   Tobacco Use   Smoking status: Never   Smokeless tobacco: Never  Vaping Use   Vaping Use: Never used  Substance Use Topics   Alcohol use: Yes    Alcohol/week: 1.0 standard drink    Types: 1 Shots of liquor per week    Comment: 1-2 times a week   Drug use: No     Allergies   Patient has no known allergies.   Review of  Systems Review of Systems Per HPI  Physical Exam Triage Vital Signs ED Triage Vitals  Enc Vitals Group     BP 07/24/21 1825 138/81     Pulse Rate 07/24/21 1825 82     Resp 07/24/21 1825 18     Temp 07/24/21 1825 98 F (36.7 C)     Temp Source 07/24/21 1825 Oral     SpO2 07/24/21 1825 99 %     Weight --      Height --  Head Circumference --      Peak Flow --      Pain Score 07/24/21 1821 7     Pain Loc --      Pain Edu? --      Excl. in [place]? --    No data found.  Updated Vital Signs BP 138/81 (BP Location: Right Arm)   Pulse 82   Temp 98 F (36.7 C) (Oral)   Resp 18   LMP 11/25/2011   SpO2 99%   Visual Acuity Right Eye Distance:   Left Eye Distance:   Bilateral Distance:    Right Eye Near:   Left Eye Near:    Bilateral Near:     Physical Exam Vitals and nursing note reviewed.  Constitutional:      Appearance: Normal appearance. She is not ill-appearing.  HENT:     Head: Atraumatic.     Right Ear: Tympanic membrane and external ear normal.     Left Ear: Tympanic membrane and external ear normal.     Nose: Rhinorrhea present.     Mouth/Throat:     Mouth: Mucous membranes are moist.     Pharynx: Posterior oropharyngeal erythema present.  Eyes:     Extraocular Movements: Extraocular movements intact.     Conjunctiva/sclera: Conjunctivae normal.  Cardiovascular:     Rate and Rhythm: Normal rate and regular rhythm.     Heart sounds: Normal heart sounds.  Pulmonary:     Effort: Pulmonary effort is normal.     Breath sounds: Normal breath sounds. No wheezing or rales.  Musculoskeletal:        General: Normal range of motion.     Cervical back: Normal range of motion and neck supple.  Skin:    General: Skin is warm and dry.  Neurological:     Mental Status: She is alert and oriented to person, place, and time.  Psychiatric:        Mood and Affect: Mood normal.        Thought Content: Thought content normal.        Judgment: Judgment normal.      UC Treatments / Results  Labs (all labs ordered are listed, but only abnormal results are displayed) Labs Reviewed  COVID-19, FLU A+B NAA    EKG   Radiology No results found.  Procedures Procedures (including critical care time)  Medications Ordered in UC Medications - No data to display  Initial Impression / Assessment and Plan / UC Course  I have reviewed the triage vital signs and the nursing notes.  Pertinent labs & imaging results that were available during my care of the patient were reviewed by me and considered in my medical decision making (see chart for details).     COVID and flu testing pending, will treat proactively for influenza with Tamiflu while awaiting these test results.  Phenergan DM given for symptomatic benefit, discussed over-the-counter supportive home care and medications.  Return for acutely worsening symptoms.  Work note given  Final Clinical Impressions(s) / UC Diagnoses   Final diagnoses:  Exposure to the flu  Viral URI with cough   Discharge Instructions   None    ED Prescriptions     Medication Sig Dispense Auth. Provider   oseltamivir (TAMIFLU) 75 MG capsule Take 1 capsule (75 mg total) by mouth every 12 (twelve) hours. 10 capsule Volney American, [place]   promethazine-dextromethorphan (PROMETHAZINE-DM) 6.25-15 MG/5ML syrup Take 5 mLs by mouth 4 (four) times daily as needed.  100 mL Volney American, [place]      PDMP not reviewed this encounter.   Volney American, [place] 07/24/21 1859

## 2021-07-25 LAB — COVID-19, FLU A+B NAA
Influenza A, NAA: NOT DETECTED
Influenza B, NAA: NOT DETECTED
SARS-CoV-2, NAA: NOT DETECTED

## 2021-09-24 ENCOUNTER — Encounter: Payer: Self-pay | Admitting: *Deleted

## 2021-10-06 ENCOUNTER — Other Ambulatory Visit: Payer: Self-pay

## 2021-10-06 ENCOUNTER — Ambulatory Visit: Payer: BC Managed Care – PPO | Admitting: Adult Health

## 2021-10-06 ENCOUNTER — Encounter: Payer: Self-pay | Admitting: Adult Health

## 2021-10-06 ENCOUNTER — Ambulatory Visit (INDEPENDENT_AMBULATORY_CARE_PROVIDER_SITE_OTHER): Payer: BC Managed Care – PPO

## 2021-10-06 VITALS — BP 144/77 | HR 76 | Ht 66.0 in | Wt 154.5 lb

## 2021-10-06 DIAGNOSIS — I8393 Asymptomatic varicose veins of bilateral lower extremities: Secondary | ICD-10-CM

## 2021-10-06 DIAGNOSIS — G500 Trigeminal neuralgia: Secondary | ICD-10-CM | POA: Diagnosis not present

## 2021-10-06 NOTE — Progress Notes (Signed)
PATIENT: Yvette Joyce DOB: Jun 24, 1961  REASON FOR VISIT: follow up HISTORY FROM: patient PRIMARY NEUROLOGIST: Dr. Rexene Alberts  HISTORY OF PRESENT ILLNESS: Today 10/06/21:  Yvette Joyce is a 61 year old female with a history of trigeminal neuralgia on the left side.  She went underwent gamma knife surgery in November at [facility].  She states since then she has been pain-free.  She has been able to come off all her medication.  04/06/21: Yvette Joyce is a 61 year old female with a history of trigeminal neuralgia on the left side.  She returns today for follow-up.  The patient states that she continues to have exacerbations.  Weather seems to be a trigger for her.  She states that this morning she went out to walk the dog and the pain came on suddenly.  She states that sometimes the pain leaves as quick as it came on.  But she has some level of discomfort every day.  She has severe discomfort at least 2-3 times a week.  She states that cold water seems to be a trigger at times.  The patient is currently on carbamazepine 200 mg twice a day.  She has gabapentin 300 mg but only takes it when she has pain.  She states that the dose does make her sleepy.  She returns today for an evaluation.  HISTORY Yvette Joyce is a 61 year old right-handed woman with an underlying medical history of hypothyroidism, [place] fasciitis, carpal tunnel syndrome, arthritis, status post left total knee replacement in January 2018, who reports recurrent symptoms of left sided facial pain.  Symptoms started in February.  She had severe pain, saw her dentist, Dr. Jannifer Franklin who eventually gave her some local shots which helped for about 2 days.  She saw Dr. Tarri Glenn, oral surgeon and a hematologist as I understand, Dr Sonny Dandy, who restarted her on carbamazepine and she was initially taking 100 mg twice daily and was recently advised to increase it to 200 mg twice daily.  She is also on gabapentin 300 mg once daily.    I had previously evaluated her for  trigeminal neuralgia and she has not been seen in this clinic in about 3 and half years.   She saw Ward Givens, NP, in the interim on 01/05/2017, at which time she was no longer on carbamazepine but had return of left facial pain.  She was advised to restart carbamazepine with 100 mg twice daily.  She had a subsequent appointment with Ward Givens, NP on 07/06/2017, at which time she was on carbamazepine 100 mg twice daily, she reported significant stressors at the time.  She was advised to increase the carbamazepine 250 mg twice daily.    She had a recent brain MRI with and without contrast on 11/28/2020 and I reviewed the results:   IMPRESSION: Stable from 2017.  No explanation for left trigeminal neuralgia.   I was also personally able to review the images through the PACS system.  Some motion degradation was noted.   Symptoms have improved, she reports that carbamazepine has been helpful.  In addition, she is on Adderall 20 mg twice daily, gabapentin 300 mg once daily.  She had a Toradol shot through your office on 11/08/2020 when she saw Eloy End, [place].    REVIEW OF SYSTEMS: Out of a complete 14 system review of symptoms, the patient complains only of the following symptoms, and all other reviewed systems are negative.  ALLERGIES: No Known Allergies  HOME MEDICATIONS: Outpatient Medications Prior to Visit  Medication Sig Dispense Refill   Bioflavonoid Products (GRAPE SEED PO) Take by mouth daily.     Coenzyme Q10-Vitamin E (QUNOL ULTRA COQ10 PO) Take by mouth daily.     levothyroxine (SYNTHROID, LEVOTHROID) 100 MCG tablet TAKE 1 TABLET BY MOUTH DAILY BEFORE BREAKFAST. 30 tablet 2   Nutritional Supplements (VITAL HIGH PROTEIN PO) Take by mouth daily.     amphetamine-dextroamphetamine (ADDERALL) 20 MG tablet Take 20 mg by mouth 2 (two) times daily.      carBAMazepine (TEGRETOL) 100 MG/5ML suspension Take 10 mLs (200 mg total) by mouth 3 (three) times daily. 900 mL 1   gabapentin  (NEURONTIN) 300 MG capsule Take 1 capsule (300 mg total) by mouth 3 (three) times daily. 40 capsule 0   HYDROcodone-acetaminophen (NORCO/VICODIN) 5-325 MG tablet Take 1 tablet by mouth every 4 (four) hours as needed. 6 tablet 0   ipratropium (ATROVENT) 0.03 % nasal spray Place 2 sprays into both nostrils 3 (three) times daily as needed for rhinitis. 30 mL 0   levocetirizine (XYZAL) 5 MG tablet Take 1 tablet (5 mg total) by mouth every evening. 90 tablet 0   oseltamivir (TAMIFLU) 75 MG capsule Take 1 capsule (75 mg total) by mouth every 12 (twelve) hours. 10 capsule 0   promethazine-dextromethorphan (PROMETHAZINE-DM) 6.25-15 MG/5ML syrup Take 5 mLs by mouth 3 (three) times daily as needed for cough. 140 mL 0   promethazine-dextromethorphan (PROMETHAZINE-DM) 6.25-15 MG/5ML syrup Take 5 mLs by mouth 4 (four) times daily as needed. 100 mL 0   XIIDRA 5 % SOLN INSTILL 1 DROP INTO BOTHKEYES TWICE DAILY.     No facility-administered medications prior to visit.    PAST MEDICAL HISTORY: Past Medical History:  Diagnosis Date   Adult attention deficit disorder    Arthritis    Complication of anesthesia    "hard to wake up"   Constipation    Fatigue 03/07/2015   Headache(784.0)    Hematuria 04/12/2013   Hypothyroidism    Postmenopause 03/07/2015   Primary localized osteoarthritis of left knee 08/26/2016   Sleep disturbance 03/07/2015   Snoring    Thyroid disease    Trigeminal neuralgia pain     PAST SURGICAL HISTORY: Past Surgical History:  Procedure Laterality Date   ABDOMINAL HYSTERECTOMY     BREAST CYST ASPIRATION  2015   CARPAL TUNNEL RELEASE Right 07/2016   COLONOSCOPY  09/24/2011   LDJ:[id number] polyps in the ascending colon/diverticula in the sigmoid colon/internal hemorrhoids   cosmetic eye surgery     fibroids removed from uterus     HYSTEROSCOPY WITH D & C  12/03/2011   Procedure: [place] /HYSTEROSCOPY;  Surgeon: Jonnie Kind, MD;  Location: [facility];  Service:  Gynecology;  Laterality: N/A;   KNEE ARTHROSCOPY W/ DEBRIDEMENT     LAPAROSCOPIC SALPINGO OOPHERECTOMY  07/11/2012   Procedure: LAPAROSCOPIC SALPINGO OOPHORECTOMY;  Surgeon: Jonnie Kind, MD;  Location: [facility];  Service: Gynecology;  Laterality: Bilateral;   LAPAROSCOPIC SUPRACERVICAL HYSTERECTOMY  07/11/2012   Procedure: LAPAROSCOPIC SUPRACERVICAL HYSTERECTOMY;  Surgeon: Jonnie Kind, MD;  Location: [facility];  Service: Gynecology;  Laterality: N/A;   LASIK     NASAL SEPTUM SURGERY     POLYPECTOMY  12/03/2011   Procedure: POLYPECTOMY;  Surgeon: Jonnie Kind, MD;  Location: [facility];  Service: Gynecology;  Laterality: N/A;  resection of polyp   TOTAL KNEE ARTHROPLASTY Left 09/06/2016   TOTAL KNEE ARTHROPLASTY Left 09/06/2016   Procedure: TOTAL KNEE ARTHROPLASTY;  Surgeon:  Elsie Saas, MD;  Location: [place];  Service: Orthopedics;  Laterality: Left;   TRIGGER FINGER RELEASE  12/2016   VEIN SURGERY     Varicose veins    FAMILY HISTORY: Family History  Problem Relation Age of Onset   Colon cancer Maternal Uncle    Cancer Sister    Thyroid disease Sister    Breast cancer Sister 57       Twin Sister   Heart disease Father    Hypertension Father    Stroke Mother    Asthma Mother    Hypertension Mother    Breast cancer Cousin     SOCIAL HISTORY: Social History   Socioeconomic History   Marital status: Divorced    Spouse name: Not on file   Number of children: 0   Years of education: College   Highest education level: Not on file  Occupational History   Not on file  Tobacco Use   Smoking status: Never   Smokeless tobacco: Never  Vaping Use   Vaping Use: Never used  Substance and Sexual Activity   Alcohol use: Not Currently    Comment: 1-2 times a week   Drug use: No   Sexual activity: Yes    Birth control/protection: Surgical    Comment: supracervical hyst  Other Topics Concern   Not on file  Social History Narrative   Drinks about 5- 5 Hour [employer] a week     Social Determinants of Radio broadcast assistant Strain: Not on file  Food Insecurity: Not on file  Transportation Needs: Not on file  Physical Activity: Not on file  Stress: Not on file  Social Connections: Not on file  Intimate Partner Violence: Not on file      PHYSICAL EXAM  Vitals:   10/06/21 0816  BP: (!) 144/77  Pulse: 76  Weight: 154 lb 8 oz (70.1 kg)  Height: 5\' 6"  (1.676 m)   Body mass index is 24.94 kg/m.  Generalized: Well developed, in no acute distress   Neurological examination  Mentation: Alert oriented to time, place, history taking. Follows all commands speech and language fluent Cranial nerve II-XII: Pupils were equal round reactive to light. Extraocular movements were full, visual field were full on confrontational test. Facial sensation and strength were normal. Uvula tongue midline. Head turning and shoulder shrug  were normal and symmetric. Motor: The motor testing reveals 5 over 5 strength of all 4 extremities. Good symmetric motor tone is noted throughout.  Sensory: Sensory testing is intact to soft touch on all 4 extremities. No evidence of extinction is noted.  Coordination: Cerebellar testing reveals good finger-nose-finger and heel-to-shin bilaterally.  Gait and station: Gait is normal.   DIAGNOSTIC DATA (LABS, IMAGING, TESTING) - I reviewed patient records, labs, notes, testing and imaging myself where available.  Lab Results  Component Value Date   WBC 4.9 04/12/2021   HGB 16.2 (H) 04/12/2021   HCT 49.0 (H) 04/12/2021   MCV 95.3 04/12/2021   PLT 196 04/12/2021      Component Value Date/Time   NA 139 04/12/2021 1622   NA 145 (H) 07/06/2017 1534   K 3.9 04/12/2021 1622   CL 100 04/12/2021 1622   CO2 28 04/12/2021 1622   GLUCOSE 78 04/12/2021 1622   BUN 14 04/12/2021 1622   BUN 7 07/06/2017 1534   CREATININE 0.60 04/12/2021 1622   CREATININE 0.70 02/28/2014 1208   CALCIUM 9.9 04/12/2021 1622   PROT 7.3 04/12/2021 1622  PROT 6.1 07/06/2017 1534   ALBUMIN 4.5 04/12/2021 1622   ALBUMIN 4.0 07/06/2017 1534   AST 33 04/12/2021 1622   ALT 27 04/12/2021 1622   ALKPHOS 114 04/12/2021 1622   BILITOT 0.6 04/12/2021 1622   BILITOT 0.3 07/06/2017 1534   GFRNONAA >60 04/12/2021 1622   GFRAA 108 07/06/2017 1534   Lab Results  Component Value Date   CHOL 184 02/28/2014   HDL 64 02/28/2014   LDLCALC 108 (H) 02/28/2014   TRIG 62 02/28/2014   CHOLHDL 2.9 02/28/2014   No results found for: HGBA1C No results found for: VITAMINB12 Lab Results  Component Value Date   TSH 4.320 07/07/2015      ASSESSMENT AND PLAN 61 y.o. year old female  has a past medical history of Adult attention deficit disorder, Arthritis, Complication of anesthesia, Constipation, Fatigue (03/07/2015), Headache(784.0), Hematuria (04/12/2013), Hypothyroidism, Postmenopause (03/07/2015), Primary localized osteoarthritis of left knee (08/26/2016), Sleep disturbance (03/07/2015), Snoring, Thyroid disease, and Trigeminal neuralgia pain. here with :  1.  Trigeminal neuralgia on the left  --Able to discontinue all medication --Stable not having any pain --Patient will follow-up on an as-needed basis     Ward Givens, MSN, NP-C 10/06/2021, 8:40 AM [facility] [address] [place], [place] [address] [phone]

## 2021-10-06 NOTE — Progress Notes (Signed)
Treated pt's small spider and reticular veins of BLE with Asclera 1% administered with a 27g butterfly.  Patient received a total of 2 mL. Pt tolerated well. Easy access. Post treatment care instructions given on both handout and verbally. Will follow prn.  Photos: Yes.    Compression stockings applied: Yes.

## 2021-10-06 NOTE — Patient Instructions (Signed)
Glad that you have been Pain free!!!!   If your symptoms worsen or you develop new symptoms please let us know.

## 2021-11-29 ENCOUNTER — Encounter: Payer: Self-pay | Admitting: Urgent Care

## 2021-11-29 ENCOUNTER — Ambulatory Visit
Admission: EM | Admit: 2021-11-29 | Discharge: 2021-11-29 | Disposition: A | Discharge status: Home / Self Care | Payer: BC Managed Care – PPO | Attending: Urgent Care | Admitting: Urgent Care

## 2021-11-29 DIAGNOSIS — J3489 Other specified disorders of nose and nasal sinuses: Secondary | ICD-10-CM

## 2021-11-29 DIAGNOSIS — J309 Allergic rhinitis, unspecified: Secondary | ICD-10-CM | POA: Diagnosis not present

## 2021-11-29 DIAGNOSIS — J019 Acute sinusitis, unspecified: Secondary | ICD-10-CM

## 2021-11-29 MED ORDER — AMOXICILLIN-POT CLAVULANATE 875-125 MG PO TABS: 1.0000 | ORAL_TABLET | Freq: Two times a day (BID) | ORAL | 0 refills | Status: DC

## 2021-11-29 MED ORDER — LEVOCETIRIZINE DIHYDROCHLORIDE 5 MG PO TABS: 5.0000 mg | ORAL_TABLET | Freq: Every evening | ORAL | 0 refills | Status: DC

## 2021-11-29 MED ORDER — PREDNISONE 20 MG PO TABS: ORAL_TABLET | ORAL | 0 refills | Status: DC

## 2021-11-29 MED ORDER — PROMETHAZINE-DM 6.25-15 MG/5ML PO SYRP: 5.0000 mL | ORAL_SOLUTION | Freq: Three times a day (TID) | ORAL | 0 refills | Status: DC | PRN

## 2021-11-29 NOTE — ED Provider Notes (Signed)
?[place] ? ? ?MRN: [id number] DOB: 14-Dec-1960 ? ?Subjective:  ? ?Yvette Joyce is a 61 y.o. female presenting for 2-week history of persistent sinus pain, sinus congestion, left ear pain, sinus headaches, throat pain, coughing.  No chest pain, shortness of breath or wheezing.  No history of asthma.  No history of seizures.  Patient did go to her PCP and was prescribed azithromycin which did not help.  She is also been using Mucinex and Zyrtec.  Has a history of allergies and typically she has a very difficult time with this in the spring season.  She is not a smoker. ? ? ?No current facility-administered medications for this encounter. ? ?Current Outpatient Medications:  ?  amphetamine-dextroamphetamine (ADDERALL) 20 MG tablet, Take 20 mg by mouth 2 (two) times daily. , Disp: , Rfl:  ?  Bioflavonoid Products (GRAPE SEED PO), Take by mouth daily., Disp: , Rfl:  ?  carBAMazepine (TEGRETOL) 100 MG/5ML suspension, Take 10 mLs (200 mg total) by mouth 3 (three) times daily., Disp: 900 mL, Rfl: 1 ?  Coenzyme Q10-Vitamin E (QUNOL ULTRA COQ10 PO), Take by mouth daily., Disp: , Rfl:  ?  gabapentin (NEURONTIN) 300 MG capsule, Take 1 capsule (300 mg total) by mouth 3 (three) times daily., Disp: 40 capsule, Rfl: 0 ?  HYDROcodone-acetaminophen (NORCO/VICODIN) 5-325 MG tablet, Take 1 tablet by mouth every 4 (four) hours as needed., Disp: 6 tablet, Rfl: 0 ?  ipratropium (ATROVENT) 0.03 % nasal spray, Place 2 sprays into both nostrils 3 (three) times daily as needed for rhinitis., Disp: 30 mL, Rfl: 0 ?  levocetirizine (XYZAL) 5 MG tablet, Take 1 tablet (5 mg total) by mouth every evening., Disp: 90 tablet, Rfl: 0 ?  levothyroxine (SYNTHROID, LEVOTHROID) 100 MCG tablet, TAKE 1 TABLET BY MOUTH DAILY BEFORE BREAKFAST., Disp: 30 tablet, Rfl: 2 ?  Nutritional Supplements (VITAL HIGH PROTEIN PO), Take by mouth daily., Disp: , Rfl:  ?  oseltamivir (TAMIFLU) 75 MG capsule, Take 1 capsule (75 mg total) by mouth every 12  (twelve) hours., Disp: 10 capsule, Rfl: 0 ?  promethazine-dextromethorphan (PROMETHAZINE-DM) 6.25-15 MG/5ML syrup, Take 5 mLs by mouth 3 (three) times daily as needed for cough., Disp: 140 mL, Rfl: 0 ?  promethazine-dextromethorphan (PROMETHAZINE-DM) 6.25-15 MG/5ML syrup, Take 5 mLs by mouth 4 (four) times daily as needed., Disp: 100 mL, Rfl: 0 ?  XIIDRA 5 % SOLN, INSTILL 1 DROP INTO BOTHKEYES TWICE DAILY., Disp: , Rfl:   ? ?No Known Allergies ? ?Past Medical History:  ?Diagnosis Date  ? Adult attention deficit disorder   ? Arthritis   ? Complication of anesthesia   ? "hard to wake up"  ? Constipation   ? Fatigue 03/07/2015  ? Headache(784.0)   ? Hematuria 04/12/2013  ? Hypothyroidism   ? Postmenopause 03/07/2015  ? Primary localized osteoarthritis of left knee 08/26/2016  ? Sleep disturbance 03/07/2015  ? Snoring   ? Thyroid disease   ? Trigeminal neuralgia pain   ?  ? ?Past Surgical History:  ?Procedure Laterality Date  ? ABDOMINAL HYSTERECTOMY    ? BREAST CYST ASPIRATION  2015  ? CARPAL TUNNEL RELEASE Right 07/2016  ? COLONOSCOPY  09/24/2011  ? WIO:[id number] polyps in the ascending colon/diverticula in the sigmoid colon/internal hemorrhoids  ? cosmetic eye surgery    ? fibroids removed from uterus    ? HYSTEROSCOPY WITH D & C  12/03/2011  ? Procedure: DILATATION AND CURETTAGE /HYSTEROSCOPY;  Surgeon: Jonnie Kind, MD;  Location: [facility];  Service: Gynecology;  Laterality: N/A;  ? KNEE ARTHROSCOPY W/ DEBRIDEMENT    ? LAPAROSCOPIC SALPINGO OOPHERECTOMY  07/11/2012  ? Procedure: LAPAROSCOPIC SALPINGO OOPHORECTOMY;  Surgeon: Jonnie Kind, MD;  Location: [facility];  Service: Gynecology;  Laterality: Bilateral;  ? LAPAROSCOPIC SUPRACERVICAL HYSTERECTOMY  07/11/2012  ? Procedure: LAPAROSCOPIC SUPRACERVICAL HYSTERECTOMY;  Surgeon: Jonnie Kind, MD;  Location: [facility];  Service: Gynecology;  Laterality: N/A;  ? LASIK    ? NASAL SEPTUM SURGERY    ? POLYPECTOMY  12/03/2011  ? Procedure: POLYPECTOMY;  Surgeon: Jonnie Kind, MD;   Location: [facility];  Service: Gynecology;  Laterality: N/A;  resection of polyp  ? TOTAL KNEE ARTHROPLASTY Left 09/06/2016  ? TOTAL KNEE ARTHROPLASTY Left 09/06/2016  ? Procedure: TOTAL KNEE ARTHROPLASTY;  Surgeon: Elsie Saas, MD;  Location: [place];  Service: Orthopedics;  Laterality: Left;  ? TRIGGER FINGER RELEASE  12/2016  ? VEIN SURGERY    ? Varicose veins  ? ? ?Family History  ?Problem Relation Age of Onset  ? Colon cancer Maternal Uncle   ? Cancer Sister   ? Thyroid disease Sister   ? Breast cancer Sister 39  ?     Twin Sister  ? Heart disease Father   ? Hypertension Father   ? Stroke Mother   ? Asthma Mother   ? Hypertension Mother   ? Breast cancer Cousin   ? ? ?Social History  ? ?Tobacco Use  ? Smoking status: Never  ? Smokeless tobacco: Never  ?Vaping Use  ? Vaping Use: Never used  ?Substance Use Topics  ? Alcohol use: Not Currently  ?  Comment: 1-2 times a week  ? Drug use: No  ? ? ?ROS ? ? ?Objective:  ? ?Vitals: ?BP 113/75 (BP Location: Right Arm)   Pulse 68   Temp 97.7 ?F (36.5 ?C) (Oral)   Resp 16   LMP 11/25/2011   SpO2 97%  ? ?Physical Exam ?Constitutional:   ?   General: She is not in acute distress. ?   Appearance: Normal appearance. She is well-developed and normal weight. She is not ill-appearing, toxic-appearing or diaphoretic.  ?HENT:  ?   Head: Normocephalic and atraumatic.  ?   Right Ear: Tympanic membrane, ear canal and external ear normal. No drainage or tenderness. No middle ear effusion. There is no impacted cerumen. Tympanic membrane is not erythematous.  ?   Left Ear: Tympanic membrane, ear canal and external ear normal. No drainage or tenderness.  No middle ear effusion. There is no impacted cerumen. Tympanic membrane is not erythematous.  ?   Nose: Congestion present. No rhinorrhea.  ?   Mouth/Throat:  ?   Mouth: Mucous membranes are moist. No oral lesions.  ?   Pharynx: No pharyngeal swelling, oropharyngeal exudate, posterior oropharyngeal erythema or uvula swelling.  ?    Tonsils: No tonsillar exudate or tonsillar abscesses.  ?Eyes:  ?   General: No scleral icterus.    ?   Right eye: No discharge.     ?   Left eye: No discharge.  ?   Extraocular Movements: Extraocular movements intact.  ?   Right eye: Normal extraocular motion.  ?   Left eye: Normal extraocular motion.  ?   Conjunctiva/sclera: Conjunctivae normal.  ?Cardiovascular:  ?   Rate and Rhythm: Normal rate.  ?   Heart sounds: No murmur heard. ?  No friction rub. No gallop.  ?Pulmonary:  ?   Effort: Pulmonary effort is normal. No  respiratory distress.  ?   Breath sounds: No stridor. No wheezing, rhonchi or rales.  ?Chest:  ?   Chest wall: No tenderness.  ?Musculoskeletal:  ?   Cervical back: Normal range of motion and neck supple.  ?Lymphadenopathy:  ?   Cervical: No cervical adenopathy.  ?Skin: ?   General: Skin is warm and dry.  ?Neurological:  ?   General: No focal deficit present.  ?   Mental Status: She is alert and oriented to person, place, and time.  ?Psychiatric:     ?   Mood and Affect: Mood normal.     ?   Behavior: Behavior normal.  ? ? ?Assessment and Plan :  ? ?PDMP not reviewed this encounter. ? ?1. Acute non-recurrent sinusitis, unspecified location   ?2. Allergic rhinitis, unspecified seasonality, unspecified trigger   ?3. Sinus pain   ? ?Will start empiric treatment for sinusitis with Augmentin.  Recommended supportive care otherwise including the use of oral antihistamine long-term such as Xyzal to help her with her allergic rhinitis.  As she is having a current flare patient requested an oral prednisone course and I am in agreement. Deferred imaging given clear cardiopulmonary exam, hemodynamically stable vital signs. Counseled patient on potential for adverse effects with medications prescribed/recommended today, ER and return-to-clinic precautions discussed, patient verbalized understanding. ? ?  ?Jaynee Eagles, PA-C ?11/29/21 0901 ? ?

## 2021-11-29 NOTE — ED Triage Notes (Signed)
Pt reports cough, left era pain and sore throat x  2 weeks. Pt started antibiotics and Mucinex DM, Zyrtec 5 days ago without relief.  ?

## 2021-12-09 ENCOUNTER — Encounter: Payer: Self-pay | Admitting: Internal Medicine

## 2021-12-21 ENCOUNTER — Ambulatory Visit: Payer: BC Managed Care – PPO | Admitting: Gastroenterology

## 2021-12-21 ENCOUNTER — Encounter: Payer: Self-pay | Admitting: Gastroenterology

## 2021-12-21 VITALS — BP 132/78 | HR 78 | Temp 97.5°F | Ht 66.0 in | Wt 161.6 lb

## 2021-12-21 DIAGNOSIS — Z86010 Personal history of colonic polyps: Secondary | ICD-10-CM | POA: Diagnosis not present

## 2021-12-21 DIAGNOSIS — K5900 Constipation, unspecified: Secondary | ICD-10-CM | POA: Diagnosis not present

## 2021-12-21 NOTE — Patient Instructions (Signed)
We are scheduling you for colonoscopy with Dr. Abbey Chatters in the future.  I have placed instructions for Suprep for your colon prep.  If insurance does not cover it please contact our office we will order what ever prep your insurance will cover. ? ?She begin taking Benefiber 2 teaspoons daily. ? ?It was a pleasure to meet you today! ? ?It was a pleasure to see you today. I want to create trusting relationships with patients. If you receive a survey regarding your visit,  I greatly appreciate you taking time to fill this out on paper or through your MyChart. I value your feedback. ? ?Venetia Night, MSN, FNP-BC, AGACNP-BC ?[facility] ? ? ?

## 2021-12-21 NOTE — H&P (View-Only) (Signed)
GI Office Note    Referring Provider: Celene Squibb, MD Primary Care Physician:  Celene Squibb, MD  Primary GI: Dr. Abbey Chatters  Chief Complaint   Chief Complaint  Patient presents with   Colonoscopy     History of Present Illness   Yvette Joyce is a 61 y.o. female presenting today at the request of Celene Squibb, MD for colon cancer screening.   Last colonoscopy 09/24/2011 -single sessile polyp in the ascending colon (tubular adenoma), diverticula found in the sigmoid colon, internal hemorrhoids  Last office visit 11/13/2014 -she presented for evaluation of hemorrhoids with intermittent rectal pain.  Colonoscopy history of internal hemorrhoids.  She was given Anusol rectal suppositories to use twice daily for 1 week.  Hemorrhoid banding also discussed with her at that time.   Today: 1st cousin just recently passed away at 30. Uncle passed of colon cancer.   Now if she eats a big meal she has to go to the bathroom since her surgery. Was on gabapentin and go to the hospital to get stronger pain medications for her trigeminal neuralgia. Reports pain began after her last COVID shot. Reports no more pain since her gamma knife procedure. Thinks all the pain medication and changing her bowel habits. She reports she used to be regular and now she sometimes has left upper abdominal pain after eating popcorn and has occasional constipation. Has pretty much gotten better since her procedure and has been going to the bathroom everyday. Sometimes soft and sometimes firm. Eats bread and vegetables. Denies incomplete emptying. Does have some urgency after eating out. No dysphagia, no melena or hematochezia.   Had lost weight when she was sick and has recently gained about 15 lbs. Denies N/V, abdominal pain.    Past Medical History:  Diagnosis Date   Adult attention deficit disorder    Arthritis    Complication of anesthesia    "hard to wake up"   Constipation    Fatigue 03/07/2015    Headache(784.0)    Hematuria 04/12/2013   Hypothyroidism    Postmenopause 03/07/2015   Primary localized osteoarthritis of left knee 08/26/2016   Sleep disturbance 03/07/2015   Snoring    Thyroid disease    Trigeminal neuralgia pain     Past Surgical History:  Procedure Laterality Date   ABDOMINAL HYSTERECTOMY     BREAST CYST ASPIRATION  2015   CARPAL TUNNEL RELEASE Right 07/2016   COLONOSCOPY  09/24/2011   NIO:[id number] polyps in the ascending colon (tubular adenoma)/diverticula in the sigmoid colon/internal hemorrhoids   cosmetic eye surgery     fibroids removed from uterus     HYSTEROSCOPY WITH D & C  12/03/2011   Procedure: [place] /HYSTEROSCOPY;  Surgeon: Jonnie Kind, MD;  Location: [facility];  Service: Gynecology;  Laterality: N/A;   KNEE ARTHROSCOPY W/ DEBRIDEMENT     LAPAROSCOPIC SALPINGO OOPHERECTOMY  07/11/2012   Procedure: LAPAROSCOPIC SALPINGO OOPHORECTOMY;  Surgeon: Jonnie Kind, MD;  Location: [facility];  Service: Gynecology;  Laterality: Bilateral;   LAPAROSCOPIC SUPRACERVICAL HYSTERECTOMY  07/11/2012   Procedure: LAPAROSCOPIC SUPRACERVICAL HYSTERECTOMY;  Surgeon: Jonnie Kind, MD;  Location: [facility];  Service: Gynecology;  Laterality: N/A;   LASIK     NASAL SEPTUM SURGERY     POLYPECTOMY  12/03/2011   Procedure: POLYPECTOMY;  Surgeon: Jonnie Kind, MD;  Location: [facility];  Service: Gynecology;  Laterality: N/A;  resection of polyp   TOTAL KNEE ARTHROPLASTY Left 09/06/2016  TOTAL KNEE ARTHROPLASTY Left 09/06/2016   Procedure: TOTAL KNEE ARTHROPLASTY;  Surgeon: Elsie Saas, MD;  Location: [place];  Service: Orthopedics;  Laterality: Left;   TRIGGER FINGER RELEASE  12/2016   VEIN SURGERY     Varicose veins    Current Outpatient Medications  Medication Sig Dispense Refill   ipratropium (ATROVENT) 0.03 % nasal spray Place 2 sprays into both nostrils 3 (three) times daily as needed for rhinitis. 30 mL 0   levocetirizine (XYZAL) 5 MG tablet Take 1  tablet (5 mg total) by mouth every evening. 90 tablet 0   levothyroxine (SYNTHROID, LEVOTHROID) 100 MCG tablet TAKE 1 TABLET BY MOUTH DAILY BEFORE BREAKFAST. 30 tablet 2   XIIDRA 5 % SOLN INSTILL 1 DROP INTO BOTHKEYES TWICE DAILY.     No current facility-administered medications for this visit.    Allergies as of 12/21/2021   (No Known Allergies)    Family History  Problem Relation Age of Onset   Stroke Mother    Asthma Mother    Hypertension Mother    Heart disease Father    Hypertension Father    Cancer Sister    Thyroid disease Sister    Breast cancer Sister 37       Twin Sister   Colon cancer Maternal Uncle    Breast cancer Cousin    Colon cancer Cousin 36    Social History   Socioeconomic History   Marital status: Divorced    Spouse name: Not on file   Number of children: 0   Years of education: College   Highest education level: Not on file  Occupational History   Not on file  Tobacco Use   Smoking status: Never   Smokeless tobacco: Never  Vaping Use   Vaping Use: Never used  Substance and Sexual Activity   Alcohol use: Not Currently    Comment: 1-2 times a week   Drug use: No   Sexual activity: Yes    Birth control/protection: Surgical    Comment: supracervical hyst  Other Topics Concern   Not on file  Social History Narrative   Drinks about 5- 5 Hour [employer] a week    Social Determinants of Radio broadcast assistant Strain: Not on file  Food Insecurity: Not on file  Transportation Needs: Not on file  Physical Activity: Not on file  Stress: Not on file  Social Connections: Not on file  Intimate Partner Violence: Not on file     Review of Systems   Gen: Denies any fever, chills, fatigue, weight loss, lack of appetite.  CV: Denies chest pain, heart palpitations, peripheral edema, syncope.  Resp: Denies shortness of breath at rest or with exertion. Denies wheezing or cough.  GI: see HPI GU : Denies urinary burning, urinary frequency,  urinary hesitancy MS: Denies joint pain, muscle weakness, cramps, or limitation of movement.  Derm: Denies rash, itching, dry skin Psych: Denies depression, anxiety, memory loss, and confusion Heme: Denies bruising, bleeding, and enlarged lymph nodes.   Physical Exam   BP 132/78   Pulse 78   Temp (!) 97.5 F (36.4 C) (Temporal)   Ht '5\' 6"'$  (1.676 m)   Wt 161 lb 9.6 oz (73.3 kg)   LMP 11/25/2011   BMI 26.08 kg/m   General:   Alert and oriented. Pleasant and cooperative. Well-nourished and well-developed.  Head:  Normocephalic and atraumatic. Eyes:  Without icterus, sclera clear and conjunctiva pink.  Ears:  Normal auditory acuity.  Lungs:  Clear to auscultation bilaterally. No wheezes, rales, or rhonchi. No distress.  Heart:  S1, S2 present without murmurs appreciated.  Abdomen:  +BS, soft, non-tender and non-distended. No HSM noted. No guarding or rebound. No masses appreciated.  Rectal:  Deferred  Msk:  Symmetrical without gross deformities. Normal posture. Extremities:  Without edema. Neurologic:  Alert and  oriented x4;  grossly normal neurologically. Skin:  Intact without significant lesions or rashes. Psych:  Alert and cooperative. Normal mood and affect.   Assessment   Yvette Joyce is a 61 y.o. female with a history of ADD, arthritis, constipation, hypothyroidism, trigeminal neuralgia s/p gamma knife treatment presenting today for change in bowel habits and need for colonoscopy.   History of colon polyps: Last colonoscopy 09/24/2011 -single sessile polyp in the ascending colon (tubular adenoma), diverticula found in the sigmoid colon, internal hemorrhoids.  She has family history of colon cancer on both sides of her family, her maternal uncle, and paternal cousin. No alarm symptoms present.  Schedule colonoscopy with propofol with Dr. Abbey Chatters in the near future.  ASA 2  Constipation: Reports that since her last colonoscopy, she was diagnosed with trigeminal neuralgia  shortly after receiving her last dose of COVID-vaccine and has since had gamma knife procedure performed.  Prior to her procedure she was chronically on opioids that induced constipation. She reports that her bowels have been getting back to normal as of recently. She states that she does go to the bathroom every day, sometimes it is firm sometimes soft.  Advise Benefiber 2 twos daily and increase fiber in her diet.  PLAN   Proceed with colonoscopy with propofol by Dr. Abbey Chatters, in near future: the risks, benefits, and alternatives have been discussed with the patient in detail. The patient states understanding and desires to proceed. ASA 2 Suprep for colonoscopy prep per patient request, she would prefer this, however will take what ever her insurance will cover. Benefiber 2 teaspoons daily.   Venetia Night, MSN, FNP-BC, AGACNP-BC [facility]

## 2021-12-21 NOTE — Progress Notes (Signed)
? ? ?GI Office Note   ? ?Referring Provider: Celene Squibb, MD ?Primary Care Physician:  Celene Squibb, MD  ?Primary GI: Dr. Abbey Chatters ? ?Chief Complaint  ? ?Chief Complaint  ?Patient presents with  ? Colonoscopy  ? ? ? ?History of Present Illness  ? ?Yvette Joyce is a 61 y.o. female presenting today at the request of Celene Squibb, MD for colon cancer screening. ? ? ?Last colonoscopy 09/24/2011 -single sessile polyp in the ascending colon (tubular adenoma), diverticula found in the sigmoid colon, internal hemorrhoids ? ?Last office visit 11/13/2014 -she presented for evaluation of hemorrhoids with intermittent rectal pain.  Colonoscopy history of internal hemorrhoids.  She was given Anusol rectal suppositories to use twice daily for 1 week.  Hemorrhoid banding also discussed with her at that time. ? ? ?Today: ?1st cousin just recently passed away at 41. Uncle passed of colon cancer.  ? ?Now if she eats a big meal she has to go to the bathroom since her surgery. Was on gabapentin and go to the hospital to get stronger pain medications for her trigeminal neuralgia. Reports pain began after her last COVID shot. Reports no more pain since her gamma knife procedure. Thinks all the pain medication and changing her bowel habits. She reports she used to be regular and now she sometimes has left upper abdominal pain after eating popcorn and has occasional constipation. Has pretty much gotten better since her procedure and has been going to the bathroom everyday. Sometimes soft and sometimes firm. Eats bread and vegetables. Denies incomplete emptying. Does have some urgency after eating out. No dysphagia, no melena or hematochezia.  ? ?Had lost weight when she was sick and has recently gained about 15 lbs. Denies N/V, abdominal pain. ? ? ? ?Past Medical History:  ?Diagnosis Date  ? Adult attention deficit disorder   ? Arthritis   ? Complication of anesthesia   ? "hard to wake up"  ? Constipation   ? Fatigue 03/07/2015  ?  Headache(784.0)   ? Hematuria 04/12/2013  ? Hypothyroidism   ? Postmenopause 03/07/2015  ? Primary localized osteoarthritis of left knee 08/26/2016  ? Sleep disturbance 03/07/2015  ? Snoring   ? Thyroid disease   ? Trigeminal neuralgia pain   ? ? ?Past Surgical History:  ?Procedure Laterality Date  ? ABDOMINAL HYSTERECTOMY    ? BREAST CYST ASPIRATION  2015  ? CARPAL TUNNEL RELEASE Right 07/2016  ? COLONOSCOPY  09/24/2011  ? PXT:GGYIRSW polyps in the ascending colon (tubular adenoma)/diverticula in the sigmoid colon/internal hemorrhoids  ? cosmetic eye surgery    ? fibroids removed from uterus    ? HYSTEROSCOPY WITH D & C  12/03/2011  ? Procedure: DILATATION AND CURETTAGE /HYSTEROSCOPY;  Surgeon: Jonnie Kind, MD;  Location: [facility];  Service: Gynecology;  Laterality: N/A;  ? KNEE ARTHROSCOPY W/ DEBRIDEMENT    ? LAPAROSCOPIC SALPINGO OOPHERECTOMY  07/11/2012  ? Procedure: LAPAROSCOPIC SALPINGO OOPHORECTOMY;  Surgeon: Jonnie Kind, MD;  Location: [facility];  Service: Gynecology;  Laterality: Bilateral;  ? LAPAROSCOPIC SUPRACERVICAL HYSTERECTOMY  07/11/2012  ? Procedure: LAPAROSCOPIC SUPRACERVICAL HYSTERECTOMY;  Surgeon: Jonnie Kind, MD;  Location: [facility];  Service: Gynecology;  Laterality: N/A;  ? LASIK    ? NASAL SEPTUM SURGERY    ? POLYPECTOMY  12/03/2011  ? Procedure: POLYPECTOMY;  Surgeon: Jonnie Kind, MD;  Location: [facility];  Service: Gynecology;  Laterality: N/A;  resection of polyp  ? TOTAL KNEE ARTHROPLASTY Left 09/06/2016  ?  TOTAL KNEE ARTHROPLASTY Left 09/06/2016  ? Procedure: TOTAL KNEE ARTHROPLASTY;  Surgeon: Elsie Saas, MD;  Location: [place];  Service: Orthopedics;  Laterality: Left;  ? TRIGGER FINGER RELEASE  12/2016  ? VEIN SURGERY    ? Varicose veins  ? ? ?Current Outpatient Medications  ?Medication Sig Dispense Refill  ? ipratropium (ATROVENT) 0.03 % nasal spray Place 2 sprays into both nostrils 3 (three) times daily as needed for rhinitis. 30 mL 0  ? levocetirizine (XYZAL) 5 MG tablet Take 1  tablet (5 mg total) by mouth every evening. 90 tablet 0  ? levothyroxine (SYNTHROID, LEVOTHROID) 100 MCG tablet TAKE 1 TABLET BY MOUTH DAILY BEFORE BREAKFAST. 30 tablet 2  ? XIIDRA 5 % SOLN INSTILL 1 DROP INTO BOTHKEYES TWICE DAILY.    ? ?No current facility-administered medications for this visit.  ? ? ?Allergies as of 12/21/2021  ? (No Known Allergies)  ? ? ?Family History  ?Problem Relation Age of Onset  ? Stroke Mother   ? Asthma Mother   ? Hypertension Mother   ? Heart disease Father   ? Hypertension Father   ? Cancer Sister   ? Thyroid disease Sister   ? Breast cancer Sister 44  ?     Twin Sister  ? Colon cancer Maternal Uncle   ? Breast cancer Cousin   ? Colon cancer Cousin 41  ? ? ?Social History  ? ?Socioeconomic History  ? Marital status: Divorced  ?  Spouse name: Not on file  ? Number of children: 0  ? Years of education: College  ? Highest education level: Not on file  ?Occupational History  ? Not on file  ?Tobacco Use  ? Smoking status: Never  ? Smokeless tobacco: Never  ?Vaping Use  ? Vaping Use: Never used  ?Substance and Sexual Activity  ? Alcohol use: Not Currently  ?  Comment: 1-2 times a week  ? Drug use: No  ? Sexual activity: Yes  ?  Birth control/protection: Surgical  ?  Comment: supracervical hyst  ?Other Topics Concern  ? Not on file  ?Social History Narrative  ? Drinks about 5- 5 Hour [employer] a week   ? ?Social Determinants of Health  ? ?Financial Resource Strain: Not on file  ?Food Insecurity: Not on file  ?Transportation Needs: Not on file  ?Physical Activity: Not on file  ?Stress: Not on file  ?Social Connections: Not on file  ?Intimate Partner Violence: Not on file  ? ? ? ?Review of Systems  ? ?Gen: Denies any fever, chills, fatigue, weight loss, lack of appetite.  ?CV: Denies chest pain, heart palpitations, peripheral edema, syncope.  ?Resp: Denies shortness of breath at rest or with exertion. Denies wheezing or cough.  ?GI: see HPI ?GU : Denies urinary burning, urinary frequency,  urinary hesitancy ?MS: Denies joint pain, muscle weakness, cramps, or limitation of movement.  ?Derm: Denies rash, itching, dry skin ?Psych: Denies depression, anxiety, memory loss, and confusion ?Heme: Denies bruising, bleeding, and enlarged lymph nodes. ? ? ?Physical Exam  ? ?BP 132/78   Pulse 78   Temp (!) 97.5 ?F (36.4 ?C) (Temporal)   Ht '5\' 6"'$  (1.676 m)   Wt 161 lb 9.6 oz (73.3 kg)   LMP 11/25/2011   BMI 26.08 kg/m?  ? ?General:   Alert and oriented. Pleasant and cooperative. Well-nourished and well-developed.  ?Head:  Normocephalic and atraumatic. ?Eyes:  Without icterus, sclera clear and conjunctiva pink.  ?Ears:  Normal auditory acuity.  ?Lungs:  Clear to auscultation bilaterally. No wheezes, rales, or rhonchi. No distress.  ?Heart:  S1, S2 present without murmurs appreciated.  ?Abdomen:  +BS, soft, non-tender and non-distended. No HSM noted. No guarding or rebound. No masses appreciated.  ?Rectal:  Deferred  ?Msk:  Symmetrical without gross deformities. Normal posture. ?Extremities:  Without edema. ?Neurologic:  Alert and  oriented x4;  grossly normal neurologically. ?Skin:  Intact without significant lesions or rashes. ?Psych:  Alert and cooperative. Normal mood and affect. ? ? ?Assessment  ? ?Yvette Joyce is a 61 y.o. female with a history of ADD, arthritis, constipation, hypothyroidism, trigeminal neuralgia s/p gamma knife treatment presenting today for change in bowel habits and need for colonoscopy.  ? ?History of colon polyps: Last colonoscopy 09/24/2011 -single sessile polyp in the ascending colon (tubular adenoma), diverticula found in the sigmoid colon, internal hemorrhoids.  She has family history of colon cancer on both sides of her family, her maternal uncle, and paternal cousin. No alarm symptoms present.  Schedule colonoscopy with propofol with Dr. Abbey Chatters in the near future.  ASA 2 ? ?Constipation: Reports that since her last colonoscopy, she was diagnosed with trigeminal neuralgia  shortly after receiving her last dose of COVID-vaccine and has since had gamma knife procedure performed.  Prior to her procedure she was chronically on opioids that induced constipation. She reports that her bowels have been

## 2021-12-22 ENCOUNTER — Telehealth: Payer: Self-pay

## 2021-12-22 NOTE — Telephone Encounter (Signed)
Tried to call pt to schedule TCS ASA 2 w/Dr. Abbey Chatters, LMOVM for return call. ?

## 2021-12-23 ENCOUNTER — Other Ambulatory Visit: Payer: Self-pay

## 2021-12-23 ENCOUNTER — Telehealth: Payer: Self-pay | Admitting: Internal Medicine

## 2021-12-23 MED ORDER — NA SULFATE-K SULFATE-MG SULF 17.5-3.13-1.6 GM/177ML PO SOLN: 1.0000 | ORAL | 0 refills | Status: DC

## 2021-12-23 NOTE — Telephone Encounter (Signed)
Pt called office and LMOVM. Tried to call pt back, LMOVM for return call. ?

## 2021-12-23 NOTE — Telephone Encounter (Signed)
Pt returning call. [phone] ?

## 2021-12-23 NOTE — Telephone Encounter (Signed)
Spoke to pt, TCS scheduled for 01/18/22 at 8:45am (pt needed a Monday d/t her job). Rx for Suprep sent to pharmacy. She requested Suprep. ?

## 2021-12-24 NOTE — Telephone Encounter (Signed)
Pre-op phone call scheduled for 01/14/22. Letter mailed to pt with procedure instructions. ? ?She is scheduled for pre-op phone call because she is ASA 2 being done on an ASA 3 day. ?

## 2021-12-24 NOTE — Telephone Encounter (Signed)
See other note

## 2021-12-28 ENCOUNTER — Other Ambulatory Visit: Payer: BC Managed Care – PPO

## 2021-12-28 ENCOUNTER — Ambulatory Visit
Admission: RE | Admit: 2021-12-28 | Discharge: 2021-12-28 | Disposition: A | Discharge status: Home / Self Care | Payer: BC Managed Care – PPO | Source: Ambulatory Visit | Attending: Internal Medicine | Admitting: Internal Medicine

## 2021-12-28 DIAGNOSIS — R928 Other abnormal and inconclusive findings on diagnostic imaging of breast: Secondary | ICD-10-CM

## 2022-01-04 ENCOUNTER — Ambulatory Visit: Payer: BC Managed Care – PPO | Admitting: Gastroenterology

## 2022-01-13 NOTE — Patient Instructions (Signed)
Yvette Joyce  01/13/2022     '@PREFPERIOPPHARMACY'$ @   Your procedure is scheduled on  01/18/2022.   Report to [facility] at  0700  A.M.   Call this number if you have problems the morning of surgery:  [phone]   Remember:  Follow the diet and prep instructions given to you by the office.    Take these medicines the morning of surgery with A SIP OF WATER                                           levothyroxine.     Do not wear jewelry, make-up or nail polish.  Do not wear lotions, powders, or perfumes, or deodorant.  Do not shave 48 hours prior to surgery.  Men may shave face and neck.  Do not bring valuables to the hospital.  [facility] is not responsible for any belongings or valuables.  Contacts, dentures or bridgework may not be worn into surgery.  Leave your suitcase in the car.  After surgery it may be brought to your room.  For patients admitted to the hospital, discharge time will be determined by your treatment team.  Patients discharged the day of surgery will not be allowed to drive home and must have someone with them for 24 hours.    Special instructions:   DO NOT smoke tobacco or vape for 24 hours before your procedure.  Please read over the following fact sheets that you were given. Anesthesia Post-op Instructions and Care and Recovery After Surgery      Colonoscopy, Adult, Care After The following information offers guidance on how to care for yourself after your procedure. Your health care provider may also give you more specific instructions. If you have problems or questions, contact your health care provider. What can I expect after the procedure? After the procedure, it is common to have: A small amount of blood in your stool for 24 hours after the procedure. Some gas. Mild cramping or bloating of your abdomen. Follow these instructions at home: Eating and drinking  Drink enough fluid to keep your urine pale yellow. Follow instructions  from your health care provider about eating or drinking restrictions. Resume your normal diet as told by your health care provider. Avoid heavy or fried foods that are hard to digest. Activity Rest as told by your health care provider. Avoid sitting for a long time without moving. Get up to take short walks every 1-2 hours. This is important to improve blood flow and breathing. Ask for help if you feel weak or unsteady. Return to your normal activities as told by your health care provider. Ask your health care provider what activities are safe for you. Managing cramping and bloating  Try walking around when you have cramps or feel bloated. If directed, apply heat to your abdomen as told by your health care provider. Use the heat source that your health care provider recommends, such as a moist heat pack or a heating pad. Place a towel between your skin and the heat source. Leave the heat on for 20-30 minutes. Remove the heat if your skin turns bright red. This is especially important if you are unable to feel pain, heat, or cold. You have a greater risk of getting burned. General instructions If you were given a sedative during the procedure, it can  affect you for several hours. Do not drive or operate machinery until your health care provider says that it is safe. For the first 24 hours after the procedure: Do not sign important documents. Do not drink alcohol. Do your regular daily activities at a slower pace than normal. Eat soft foods that are easy to digest. Take over-the-counter and prescription medicines only as told by your health care provider. Keep all follow-up visits. This is important. Contact a health care provider if: You have blood in your stool 2-3 days after the procedure. Get help right away if: You have more than a small spotting of blood in your stool. You have large blood clots in your stool. You have swelling of your abdomen. You have nausea or vomiting. You have a  fever. You have increasing pain in your abdomen that is not relieved with medicine. These symptoms may be an emergency. Get help right away. Call 911. Do not wait to see if the symptoms will go away. Do not drive yourself to the hospital. Summary After the procedure, it is common to have a small amount of blood in your stool. You may also have mild cramping and bloating of your abdomen. If you were given a sedative during the procedure, it can affect you for several hours. Do not drive or operate machinery until your health care provider says that it is safe. Get help right away if you have a lot of blood in your stool, nausea or vomiting, a fever, or increased pain in your abdomen. This information is not intended to replace advice given to you by your health care provider. Make sure you discuss any questions you have with your health care provider. Document Revised: 03/25/2021 Document Reviewed: 03/25/2021 Elsevier Patient Education  [place] After This sheet gives you information about how to care for yourself after your procedure. Your health care provider may also give you more specific instructions. If you have problems or questions, contact your health care provider. What can I expect after the procedure? After the procedure, it is common to have: Tiredness. Forgetfulness about what happened after the procedure. Impaired judgment for important decisions. Nausea or vomiting. Some difficulty with balance. Follow these instructions at home: For the time period you were told by your health care provider:     Rest as needed. Do not participate in activities where you could fall or become injured. Do not drive or use machinery. Do not drink alcohol. Do not take sleeping pills or medicines that cause drowsiness. Do not make important decisions or sign legal documents. Do not take care of children on your own. Eating and drinking Follow the  diet that is recommended by your health care provider. Drink enough fluid to keep your urine pale yellow. If you vomit: Drink water, juice, or soup when you can drink without vomiting. Make sure you have little or no nausea before eating solid foods. General instructions Have a responsible adult stay with you for the time you are told. It is important to have someone help care for you until you are awake and alert. Take over-the-counter and prescription medicines only as told by your health care provider. If you have sleep apnea, surgery and certain medicines can increase your risk for breathing problems. Follow instructions from your health care provider about wearing your sleep device: Anytime you are sleeping, including during daytime naps. While taking prescription pain medicines, sleeping medicines, or medicines that make you drowsy. Avoid smoking.  Keep all follow-up visits as told by your health care provider. This is important. Contact a health care provider if: You keep feeling nauseous or you keep vomiting. You feel light-headed. You are still sleepy or having trouble with balance after 24 hours. You develop a rash. You have a fever. You have redness or swelling around the IV site. Get help right away if: You have trouble breathing. You have new-onset confusion at home. Summary For several hours after your procedure, you may feel tired. You may also be forgetful and have poor judgment. Have a responsible adult stay with you for the time you are told. It is important to have someone help care for you until you are awake and alert. Rest as told. Do not drive or operate machinery. Do not drink alcohol or take sleeping pills. Get help right away if you have trouble breathing, or if you suddenly become confused. This information is not intended to replace advice given to you by your health care provider. Make sure you discuss any questions you have with your health care  provider. Document Revised: 07/07/2021 Document Reviewed: 07/05/2019 Elsevier Patient Education  [place].

## 2022-01-14 ENCOUNTER — Encounter (HOSPITAL_COMMUNITY)
Admission: RE | Admit: 2022-01-14 | Discharge: 2022-01-14 | Disposition: A | Discharge status: Home / Self Care | Payer: BC Managed Care – PPO | Source: Ambulatory Visit | Attending: Internal Medicine | Admitting: Internal Medicine

## 2022-01-14 ENCOUNTER — Encounter (HOSPITAL_COMMUNITY): Payer: Self-pay

## 2022-01-18 ENCOUNTER — Encounter (HOSPITAL_COMMUNITY): Payer: Self-pay

## 2022-01-18 ENCOUNTER — Ambulatory Visit (HOSPITAL_COMMUNITY): Payer: BC Managed Care – PPO | Admitting: Anesthesiology

## 2022-01-18 ENCOUNTER — Other Ambulatory Visit: Payer: Self-pay

## 2022-01-18 ENCOUNTER — Ambulatory Visit (HOSPITAL_COMMUNITY)
Admission: RE | Admit: 2022-01-18 | Discharge: 2022-01-18 | Disposition: A | Discharge status: Home / Self Care | Payer: BC Managed Care – PPO | Attending: Internal Medicine | Admitting: Internal Medicine

## 2022-01-18 ENCOUNTER — Encounter (HOSPITAL_COMMUNITY)
Admission: RE | Disposition: A | Discharge status: Home / Self Care | Payer: Self-pay | Source: Home / Self Care | Attending: Internal Medicine

## 2022-01-18 DIAGNOSIS — Z86010 Personal history of colonic polyps: Secondary | ICD-10-CM | POA: Diagnosis present

## 2022-01-18 DIAGNOSIS — Z800 Family history of malignant neoplasm of digestive organs: Secondary | ICD-10-CM | POA: Insufficient documentation

## 2022-01-18 DIAGNOSIS — K635 Polyp of colon: Secondary | ICD-10-CM | POA: Diagnosis not present

## 2022-01-18 DIAGNOSIS — E039 Hypothyroidism, unspecified: Secondary | ICD-10-CM | POA: Diagnosis not present

## 2022-01-18 DIAGNOSIS — Z1211 Encounter for screening for malignant neoplasm of colon: Secondary | ICD-10-CM | POA: Insufficient documentation

## 2022-01-18 DIAGNOSIS — K648 Other hemorrhoids: Secondary | ICD-10-CM | POA: Insufficient documentation

## 2022-01-18 DIAGNOSIS — M1990 Unspecified osteoarthritis, unspecified site: Secondary | ICD-10-CM | POA: Diagnosis not present

## 2022-01-18 DIAGNOSIS — G500 Trigeminal neuralgia: Secondary | ICD-10-CM | POA: Insufficient documentation

## 2022-01-18 HISTORY — PX: COLONOSCOPY WITH PROPOFOL: SHX5780

## 2022-01-18 HISTORY — PX: POLYPECTOMY: SHX149

## 2022-01-18 SURGERY — COLONOSCOPY WITH PROPOFOL
Anesthesia: General | Laterality: Left

## 2022-01-18 MED ORDER — LACTATED RINGERS IV SOLN
INTRAVENOUS | Status: DC | PRN
Start: 2022-01-18 — End: 2022-01-18

## 2022-01-18 MED ORDER — PROPOFOL 500 MG/50ML IV EMUL
INTRAVENOUS | Status: DC | PRN
  Administered 2022-01-18: 150 ug/kg/min via INTRAVENOUS

## 2022-01-18 MED ORDER — PROPOFOL 10 MG/ML IV BOLUS
INTRAVENOUS | Status: DC | PRN
  Administered 2022-01-18: 80 mg via INTRAVENOUS
  Administered 2022-01-18: 100 mg via INTRAVENOUS

## 2022-01-18 MED ORDER — LACTATED RINGERS IV SOLN: INTRAVENOUS | Status: DC

## 2022-01-18 MED ORDER — LIDOCAINE HCL (CARDIAC) PF 100 MG/5ML IV SOSY
PREFILLED_SYRINGE | INTRAVENOUS | Status: DC | PRN
  Administered 2022-01-18: 60 mg via INTRATRACHEAL

## 2022-01-18 NOTE — Transfer of Care (Signed)
Immediate Anesthesia Transfer of Care Note  Patient: Yvette Joyce  Procedure(s) Performed: COLONOSCOPY WITH PROPOFOL (Left) POLYPECTOMY INTESTINAL  Patient Location: Short Stay  Anesthesia Type:General  Level of Consciousness: sedated  Airway & Oxygen Therapy: Patient Spontanous Breathing  Post-op Assessment: Report given to RN and Post -op Vital signs reviewed and stable  Post vital signs: Reviewed and stable  Last Vitals:  Vitals Value Taken Time  BP    Temp    Pulse    Resp    SpO2      Last Pain:  Vitals:   01/18/22 0847  TempSrc:   PainSc: 0-No pain         Complications: No notable events documented.

## 2022-01-18 NOTE — Interval H&P Note (Signed)
History and Physical Interval Note:  01/18/2022 8:24 AM  Yvette Joyce  has presented today for surgery, with the diagnosis of history of colon polyps.  The various methods of treatment have been discussed with the patient and family. After consideration of risks, benefits and other options for treatment, the patient has consented to  Procedure(s) with comments: COLONOSCOPY WITH PROPOFOL (Left) - 8:45am--ASA 2 as a surgical intervention.  The patient's history has been reviewed, patient examined, no change in status, stable for surgery.  I have reviewed the patient's chart and labs.  Questions were answered to the patient's satisfaction.     Eloise Harman

## 2022-01-18 NOTE — Op Note (Signed)
[facility] Patient Name: Yvette Joyce Procedure Date: 01/18/2022 8:43 AM MRN: [id number] Date of Birth: 28-Feb-1961 Attending MD: Elon Alas. Abbey Chatters DO CSN: [id number] Age: 61 Admit Type: Outpatient Procedure:                Colonoscopy Indications:              Screening for colorectal malignant neoplasm Providers:                Elon Alas. Abbey Chatters, DO, Caprice Kluver, Raphael Gibney,                            Technician Referring MD:              Medicines:                See the Anesthesia note for documentation of the                            administered medications Complications:            No immediate complications. Estimated Blood Loss:     Estimated blood loss was minimal. Procedure:                Pre-Anesthesia Assessment:                           - The anesthesia plan was to use monitored                            anesthesia care (MAC).                           After obtaining informed consent, the colonoscope                            was passed under direct vision. Throughout the                            procedure, the patient's blood pressure, pulse, and                            oxygen saturations were monitored continuously. The                            [id number]-HQ190L ([id number]) scope was introduced through                            the anus and advanced to the the cecum, identified                            by appendiceal orifice and ileocecal valve. The                            colonoscopy was performed without difficulty. The                            patient tolerated the procedure well. The quality  of the bowel preparation was evaluated using the                            BBPS [facility] Bowel Preparation Scale) with scores                            of: Right Colon = 3, Transverse Colon = 3 and Left                            Colon = 3 (entire mucosa seen well with no residual                            staining, small  fragments of stool or opaque                            liquid). The total BBPS score equals 9. Findings:      The perianal and digital rectal examinations were normal.      Non-bleeding internal hemorrhoids were found during endoscopy.      A 2 mm polyp was found in the descending colon. The polyp was sessile.       The polyp was removed with a cold biopsy forceps. Resection and       retrieval were complete.      The exam was otherwise without abnormality. Impression:               - Non-bleeding internal hemorrhoids.                           - One 2 mm polyp in the descending colon, removed                            with a cold biopsy forceps. Resected and retrieved.                           - The examination was otherwise normal. Moderate Sedation:      Per Anesthesia Care Recommendation:           - Patient has a contact number available for                            emergencies. The signs and symptoms of potential                            delayed complications were discussed with the                            patient. Return to normal activities tomorrow.                            Written discharge instructions were provided to the                            patient.                           -  Resume previous diet.                           - Continue present medications.                           - Await pathology results.                           - Repeat colonoscopy in 5-10 years for surveillance.                           - Return to GI clinic PRN. Procedure Code(s):        --- Professional ---                           [phone], Colonoscopy, flexible; with biopsy, single                            or multiple Diagnosis Code(s):        --- Professional ---                           Z12.11, Encounter for screening for malignant                            neoplasm of colon                           K63.5, Polyp of colon                           K64.8, Other  hemorrhoids CPT copyright 2019 American Medical Association. All rights reserved. The codes documented in this report are preliminary and upon coder review may  be revised to meet current compliance requirements. Elon Alas. Abbey Chatters, DO Momence Abbey Chatters, DO 01/18/2022 9:02:44 AM This report has been signed electronically. Number of Addenda: 0

## 2022-01-18 NOTE — Discharge Instructions (Addendum)
  Colonoscopy Discharge Instructions  Read the instructions outlined below and refer to this sheet in the next few weeks. These discharge instructions provide you with general information on caring for yourself after you leave the hospital. Your doctor may also give you specific instructions. While your treatment has been planned according to the most current medical practices available, unavoidable complications occasionally occur.   ACTIVITY You may resume your regular activity, but move at a slower pace for the next 24 hours.  Take frequent rest periods for the next 24 hours.  Walking will help get rid of the air and reduce the bloated feeling in your belly (abdomen).  No driving for 24 hours (because of the medicine (anesthesia) used during the test).   Do not sign any important legal documents or operate any machinery for 24 hours (because of the anesthesia used during the test).  NUTRITION Drink plenty of fluids.  You may resume your normal diet as instructed by your doctor.  Begin with a light meal and progress to your normal diet. Heavy or fried foods are harder to digest and may make you feel sick to your stomach (nauseated).  Avoid alcoholic beverages for 24 hours or as instructed.  MEDICATIONS You may resume your normal medications unless your doctor tells you otherwise.  WHAT YOU CAN EXPECT TODAY Some feelings of bloating in the abdomen.  Passage of more gas than usual.  Spotting of blood in your stool or on the toilet paper.  IF YOU HAD POLYPS REMOVED DURING THE COLONOSCOPY: No aspirin products for 7 days or as instructed.  No alcohol for 7 days or as instructed.  Eat a soft diet for the next 24 hours.  FINDING OUT THE RESULTS OF YOUR TEST Not all test results are available during your visit. If your test results are not back during the visit, make an appointment with your caregiver to find out the results. Do not assume everything is normal if you have not heard from your  caregiver or the medical facility. It is important for you to follow up on all of your test results.  SEEK IMMEDIATE MEDICAL ATTENTION IF: You have more than a spotting of blood in your stool.  Your belly is swollen (abdominal distention).  You are nauseated or vomiting.  You have a temperature over 101.  You have abdominal pain or discomfort that is severe or gets worse throughout the day.   Your colonoscopy revealed 1 polyp(s) which I removed successfully. Await pathology results, my office will contact you. I recommend repeating colonoscopy in 5-10 years for surveillance purposes depending on the type of polyp. Otherwise follow up with GI as needed.    I hope you have a great rest of your week!  Elon Alas. Abbey Chatters, D.O. Gastroenterology and Hepatology [facility]

## 2022-01-18 NOTE — Anesthesia Preprocedure Evaluation (Signed)
Anesthesia Evaluation  Patient identified by MRN, date of birth, ID band Patient awake    Reviewed: Allergy & Precautions, H&P , NPO status , Patient's Chart, lab work & pertinent test results, reviewed documented beta blocker date and time   History of Anesthesia Complications (+) PROLONGED EMERGENCE and history of anesthetic complications  Airway Mallampati: II  TM Distance: >3 FB Neck ROM: full    Dental no notable dental hx.    Pulmonary neg pulmonary ROS,    Pulmonary exam normal breath sounds clear to auscultation       Cardiovascular Exercise Tolerance: Good negative cardio ROS   Rhythm:regular Rate:Normal     Neuro/Psych  Headaches,  Neuromuscular disease negative psych ROS   GI/Hepatic negative GI ROS, Neg liver ROS,   Endo/Other  Hypothyroidism   Renal/GU negative Renal ROS  negative genitourinary   Musculoskeletal   Abdominal   Peds  Hematology negative hematology ROS (+)   Anesthesia Other Findings   Reproductive/Obstetrics negative OB ROS                             Anesthesia Physical Anesthesia Plan  ASA: 2  Anesthesia Plan: General   Post-op Pain Management:    Induction:   PONV Risk Score and Plan: Propofol infusion  Airway Management Planned:   Additional Equipment:   Intra-op Plan:   Post-operative Plan:   Informed Consent: I have reviewed the patients History and Physical, chart, labs and discussed the procedure including the risks, benefits and alternatives for the proposed anesthesia with the patient or authorized representative who has indicated his/her understanding and acceptance.     Dental Advisory Given  Plan Discussed with: CRNA  Anesthesia Plan Comments:         Anesthesia Quick Evaluation

## 2022-01-19 NOTE — Anesthesia Postprocedure Evaluation (Signed)
Anesthesia Post Note  Patient: Yvette Joyce  Procedure(s) Performed: COLONOSCOPY WITH PROPOFOL (Left) POLYPECTOMY INTESTINAL  Patient location during evaluation: Phase II Anesthesia Type: General Level of consciousness: awake Pain management: pain level controlled Vital Signs Assessment: post-procedure vital signs reviewed and stable Respiratory status: spontaneous breathing and respiratory function stable Cardiovascular status: blood pressure returned to baseline and stable Postop Assessment: no headache and no apparent nausea or vomiting Anesthetic complications: no Comments: Late entry   No notable events documented.   Last Vitals:  Vitals:   01/18/22 0901 01/18/22 0908  BP: 114/85 125/68  Pulse: 86 84  Resp: 18 20  Temp: 36.6 C   SpO2: 98% 99%    Last Pain:  Vitals:   01/18/22 0908  TempSrc:   PainSc: 0-No pain                 Louann Sjogren

## 2022-01-20 LAB — SURGICAL PATHOLOGY

## 2022-01-26 ENCOUNTER — Encounter (HOSPITAL_COMMUNITY): Payer: Self-pay | Admitting: Internal Medicine

## 2022-01-29 ENCOUNTER — Encounter: Payer: Self-pay | Admitting: Emergency Medicine

## 2022-01-29 ENCOUNTER — Ambulatory Visit
Admission: EM | Admit: 2022-01-29 | Discharge: 2022-01-29 | Disposition: A | Discharge status: Home / Self Care | Payer: BC Managed Care – PPO | Attending: Family Medicine | Admitting: Family Medicine

## 2022-01-29 DIAGNOSIS — R197 Diarrhea, unspecified: Secondary | ICD-10-CM

## 2022-01-29 MED ORDER — LOPERAMIDE HCL 2 MG PO CAPS: 2.0000 mg | ORAL_CAPSULE | Freq: Four times a day (QID) | ORAL | 0 refills | Status: DC | PRN

## 2022-01-29 NOTE — ED Provider Notes (Signed)
[facility]    CSN: [id number] Arrival date & time: 01/29/22  0802      History   Chief Complaint No chief complaint on file.   HPI Yvette Joyce is a 61 y.o. female.   Presenting today with 2-day history of watery diarrhea.  States that his brought on worse by eating or drinking anything.  Denies fever, chills, body aches, sweats, abdominal pain, nausea, vomiting.  No new foods, new medications or supplements, sick contacts, recent travel.  So far not tried anything over-the-counter for symptoms.       Past Medical History:  Diagnosis Date   Adult attention deficit disorder    Arthritis    Complication of anesthesia    "hard to wake up"   Constipation    Fatigue 03/07/2015   Headache(784.0)    Hematuria 04/12/2013   Hypothyroidism    Postmenopause 03/07/2015   Primary localized osteoarthritis of left knee 08/26/2016   Sleep disturbance 03/07/2015   Snoring    Thyroid disease    Trigeminal neuralgia pain     Patient Active Problem List   Diagnosis Date Noted   Grief reaction 05/27/2017   Fecal occult blood test positive 05/27/2017   Encounter for gynecological examination with Papanicolaou smear of cervix 05/27/2017   Trigger finger of right thumb 09/02/2016   Primary localized osteoarthritis of left knee 08/26/2016   Trigeminal neuralgia pain    Bilateral carpal tunnel syndrome 06/17/2016   Ganglion cyst 06/17/2016   Fatigue 03/07/2015   Sleep disturbance 03/07/2015   Postmenopause 03/07/2015   Hemorrhoids 11/13/2014   Knee pain, acute 05/01/2014   Left leg weakness 05/01/2014   Hypothyroid 02/28/2014   Dilated cbd, acquired 06/22/2013   Hematuria 04/12/2013    Past Surgical History:  Procedure Laterality Date   ABDOMINAL HYSTERECTOMY     BREAST CYST ASPIRATION  2015   CARPAL TUNNEL RELEASE Right 07/2016   COLONOSCOPY  09/24/2011   E[facility]:[id number] polyps in the ascending colon (tubular adenoma)/diverticula in the sigmoid colon/internal  hemorrhoids   COLONOSCOPY WITH PROPOFOL Left 01/18/2022   Procedure: COLONOSCOPY WITH PROPOFOL;  Surgeon: Eloise Harman, DO;  [facility];  Service: Endoscopy;  Laterality: Left;  8:45am--ASA 2   cosmetic eye surgery     fibroids removed from uterus     HYSTEROSCOPY WITH D & C  12/03/2011   Procedure: DILATATION AND CURETTAGE /HYSTEROSCOPY;  Surgeon: Jonnie Kind, MD;  Location: [facility];  Service: Gynecology;  Laterality: N/A;   KNEE ARTHROSCOPY W/ DEBRIDEMENT     LAPAROSCOPIC SALPINGO OOPHERECTOMY  07/11/2012   Procedure: LAPAROSCOPIC SALPINGO OOPHORECTOMY;  Surgeon: Jonnie Kind, MD;  Location: [facility];  Service: Gynecology;  Laterality: Bilateral;   LAPAROSCOPIC SUPRACERVICAL HYSTERECTOMY  07/11/2012   Procedure: LAPAROSCOPIC SUPRACERVICAL HYSTERECTOMY;  Surgeon: Jonnie Kind, MD;  Location: [facility];  Service: Gynecology;  Laterality: N/A;   LASIK     NASAL SEPTUM SURGERY     POLYPECTOMY  12/03/2011   Procedure: POLYPECTOMY;  Surgeon: Jonnie Kind, MD;  Location: [facility];  Service: Gynecology;  Laterality: N/A;  resection of polyp   POLYPECTOMY  01/18/2022   Procedure: POLYPECTOMY INTESTINAL;  Surgeon: Eloise Harman, DO;  [facility];  Service: Endoscopy;;   TOTAL KNEE ARTHROPLASTY Left 09/06/2016   TOTAL KNEE ARTHROPLASTY Left 09/06/2016   Procedure: TOTAL KNEE ARTHROPLASTY;  Surgeon: Elsie Saas, MD;  Location: [place];  Service: Orthopedics;  Laterality: Left;   TRIGGER FINGER RELEASE  12/2016   VEIN SURGERY     Varicose veins    OB History     Gravida      Para      Term      Preterm      AB      Living  0      SAB      IAB      Ectopic      Multiple      Live Births               Home Medications    Prior to Admission medications   Medication Sig Start Date End Date Taking? Authorizing Provider  loperamide (IMODIUM) 2 MG capsule Take 1 capsule (2 mg total) by mouth 4 (four) times daily as needed for diarrhea or  loose stools. 01/29/22  Yes Volney American, PA-C  acetaminophen (TYLENOL) 500 MG tablet Take 500-1,000 mg by mouth every 6 (six) hours as needed (for pain.).    [provider]  Brimonidine Tartrate (LUMIFY) 0.025 % SOLN Place 1 drop into both eyes in the morning.    [provider]  levocetirizine (XYZAL) 5 MG tablet Take 1 tablet (5 mg total) by mouth every evening. 11/29/21   Jaynee Eagles, PA-C  levothyroxine (SYNTHROID) 112 MCG tablet Take 112 mcg by mouth daily before breakfast. 12/14/21   [provider]    Family History Family History  Problem Relation Age of Onset   Stroke Mother    Asthma Mother    Hypertension Mother    Heart disease Father    Hypertension Father    Cancer Sister    Thyroid disease Sister    Breast cancer Sister 10       Twin Sister   Colon cancer Maternal Uncle    Breast cancer Cousin    Colon cancer Cousin 65    Social History Social History   Tobacco Use   Smoking status: Never   Smokeless tobacco: Never  Vaping Use   Vaping Use: Never used  Substance Use Topics   Alcohol use: Not Currently    Comment: 1-2 times a week   Drug use: No     Allergies   Patient has no known allergies.   Review of Systems Review of Systems Per HPI  Physical Exam Triage Vital Signs ED Triage Vitals [01/29/22 0809]  Enc Vitals Group     BP 125/80     Pulse Rate 80     Resp 18     Temp 98.1 F (36.7 C)     Temp Source Oral     SpO2 95 %     Weight      Height      Head Circumference      Peak Flow      Pain Score 0     Pain Loc      Pain Edu?      Excl. in [place]?    No data found.  Updated Vital Signs BP 125/80 (BP Location: Right Arm)   Pulse 80   Temp 98.1 F (36.7 C) (Oral)   Resp 18   LMP 11/25/2011   SpO2 95%   Visual Acuity Right Eye Distance:   Left Eye Distance:   Bilateral Distance:    Right Eye Near:   Left Eye Near:    Bilateral Near:     Physical Exam Vitals and nursing note reviewed.   Constitutional:  Appearance: Normal appearance. She is not ill-appearing.  HENT:     Head: Atraumatic.     Mouth/Throat:     Mouth: Mucous membranes are moist.  Eyes:     Extraocular Movements: Extraocular movements intact.     Conjunctiva/sclera: Conjunctivae normal.  Cardiovascular:     Rate and Rhythm: Normal rate and regular rhythm.     Heart sounds: Normal heart sounds.  Pulmonary:     Effort: Pulmonary effort is normal.     Breath sounds: Normal breath sounds.  Abdominal:     General: Bowel sounds are normal. There is no distension.     Palpations: Abdomen is soft.     Tenderness: There is no abdominal tenderness. There is no right CVA tenderness, left CVA tenderness or guarding.  Musculoskeletal:        General: Normal range of motion.     Cervical back: Normal range of motion and neck supple.  Skin:    General: Skin is warm and dry.  Neurological:     Mental Status: She is alert and oriented to person, place, and time.  Psychiatric:        Mood and Affect: Mood normal.        Thought Content: Thought content normal.        Judgment: Judgment normal.      UC Treatments / Results  Labs (all labs ordered are listed, but only abnormal results are displayed) Labs Reviewed - No data to display  EKG   Radiology No results found.  Procedures Procedures (including critical care time)  Medications Ordered in UC Medications - No data to display  Initial Impression / Assessment and Plan / UC Course  I have reviewed the triage vital signs and the nursing notes.  Pertinent labs & imaging results that were available during my care of the patient were reviewed by me and considered in my medical decision making (see chart for details).     Vital signs and exam very reassuring with no red flag findings today.  Suspect viral diarrhea, will forego stool testing today given duration of symptoms but discussed with patient that if her symptoms persist for a week or  more she would benefit from further testing.  Trial Imodium, brat diet, probiotics and continue to monitor.  Return for worsening symptoms.  Final Clinical Impressions(s) / UC Diagnoses   Final diagnoses:  Diarrhea, unspecified type   Discharge Instructions   None    ED Prescriptions     Medication Sig Dispense Auth. Provider   loperamide (IMODIUM) 2 MG capsule Take 1 capsule (2 mg total) by mouth 4 (four) times daily as needed for diarrhea or loose stools. 12 capsule Volney American, [place]      PDMP not reviewed this encounter.   Volney American, [place] 01/29/22 1752

## 2022-01-29 NOTE — ED Triage Notes (Signed)
Diarrhea x 2 days

## 2022-04-22 ENCOUNTER — Other Ambulatory Visit (HOSPITAL_COMMUNITY): Payer: Self-pay | Admitting: Nurse Practitioner

## 2022-04-22 DIAGNOSIS — N644 Mastodynia: Secondary | ICD-10-CM

## 2022-05-04 ENCOUNTER — Ambulatory Visit (HOSPITAL_COMMUNITY)
Admission: RE | Admit: 2022-05-04 | Discharge: 2022-05-04 | Disposition: A | Discharge status: Home / Self Care | Payer: BC Managed Care – PPO | Source: Ambulatory Visit | Attending: Nurse Practitioner | Admitting: Nurse Practitioner

## 2022-05-04 DIAGNOSIS — N644 Mastodynia: Secondary | ICD-10-CM

## 2022-06-10 ENCOUNTER — Other Ambulatory Visit (HOSPITAL_COMMUNITY): Payer: Self-pay | Admitting: Internal Medicine

## 2022-06-10 DIAGNOSIS — Z1231 Encounter for screening mammogram for malignant neoplasm of breast: Secondary | ICD-10-CM

## 2022-06-24 ENCOUNTER — Ambulatory Visit (HOSPITAL_COMMUNITY)
Admission: RE | Admit: 2022-06-24 | Discharge: 2022-06-24 | Disposition: A | Discharge status: Home / Self Care | Payer: BC Managed Care – PPO | Source: Ambulatory Visit | Attending: Internal Medicine | Admitting: Internal Medicine

## 2022-06-24 DIAGNOSIS — Z1231 Encounter for screening mammogram for malignant neoplasm of breast: Secondary | ICD-10-CM | POA: Insufficient documentation

## 2022-12-29 ENCOUNTER — Other Ambulatory Visit: Payer: Self-pay

## 2023-06-01 ENCOUNTER — Other Ambulatory Visit (HOSPITAL_COMMUNITY): Payer: Self-pay | Admitting: Internal Medicine

## 2023-06-01 DIAGNOSIS — Z1231 Encounter for screening mammogram for malignant neoplasm of breast: Secondary | ICD-10-CM

## 2023-06-27 ENCOUNTER — Ambulatory Visit (HOSPITAL_COMMUNITY): Payer: BC Managed Care – PPO

## 2023-06-30 ENCOUNTER — Ambulatory Visit (HOSPITAL_COMMUNITY)
Admission: RE | Admit: 2023-06-30 | Discharge: 2023-06-30 | Disposition: A | Discharge status: Home / Self Care | Payer: No Typology Code available for payment source | Source: Ambulatory Visit | Attending: Internal Medicine | Admitting: Internal Medicine

## 2023-06-30 DIAGNOSIS — Z1231 Encounter for screening mammogram for malignant neoplasm of breast: Secondary | ICD-10-CM | POA: Insufficient documentation

## 2024-01-31 LAB — LAB REPORT - SCANNED
A1c: 5.7
EGFR: 97

## 2024-04-26 ENCOUNTER — Encounter: Payer: Self-pay | Admitting: Emergency Medicine

## 2024-04-26 ENCOUNTER — Other Ambulatory Visit: Payer: Self-pay

## 2024-04-26 ENCOUNTER — Ambulatory Visit
Admission: EM | Admit: 2024-04-26 | Discharge: 2024-04-26 | Disposition: A | Discharge status: Home / Self Care | Attending: Family Medicine | Admitting: Family Medicine

## 2024-04-26 DIAGNOSIS — T63441A Toxic effect of venom of bees, accidental (unintentional), initial encounter: Secondary | ICD-10-CM

## 2024-04-26 DIAGNOSIS — H02843 Edema of right eye, unspecified eyelid: Secondary | ICD-10-CM

## 2024-04-26 MED ORDER — DEXAMETHASONE SODIUM PHOSPHATE 10 MG/ML IJ SOLN
10.0000 mg | Freq: Once | INTRAMUSCULAR | Status: AC
  Administered 2024-04-26: 10 mg via INTRAMUSCULAR

## 2024-04-26 NOTE — Discharge Instructions (Signed)
 Meds ordered this encounter  Medications   dexamethasone (DECADRON) injection 10 mg

## 2024-04-26 NOTE — ED Provider Notes (Signed)
 [facility]   [id number] 04/26/24 Arrival Time: 1315  ASSESSMENT & PLAN:  1. Swelling of eyelid, right   2. Bee sting, accidental or unintentional, initial encounter    Benadryl  if needed.  Meds ordered this encounter  Medications   dexamethasone  (DECADRON ) injection 10 mg  Observation.   Follow-up Information     Shona Norleen PEDLAR, MD.   Specialty: Internal Medicine Why: As needed. Contact information: [address] [place] [address] [phone]         [facility].   Specialty: Urgent Care Why: If worsening or failing to improve as anticipated. Contact information: [address] [place]  [address] [phone]                Reviewed expectations re: course of current medical issues. Questions answered. Outlined signs and symptoms indicating need for more acute intervention. Understanding verbalized. After Visit Summary given.   SUBJECTIVE: History from: Patient. Yvette Joyce is a 63 y.o. female. Pt reports was outside and reports something black started flying around me and reports felt a pain in right upper eyelid approx 1 hour ago. Pt reports intermittent pain ever since. Pt noted to have cold compress to right eye.  Reports was wearing sunglasses at time of sting. Has had lasix, does not wear corrective lenses.  No tx PTA.  OBJECTIVE:  Vitals:   04/26/24 1352  BP: (!) 160/91  Pulse: 88  Resp: 20  Temp: 98.8 F (37.1 C)  TempSrc: Oral  SpO2: 97%    General appearance: alert; no distress Eyes: PERRLA; EOMI; conjunctivae normal; swelling and irritation of R upper eyelid Neck: supple  Lungs: speaks full sentences without difficulty; unlabored Psychological: alert and cooperative; normal mood and affect  Labs:  Labs Reviewed - No data to display  Imaging: No results found.  No Known Allergies  Past Medical History:  Diagnosis Date   Adult attention  deficit disorder    Arthritis    Complication of anesthesia    hard to wake up   Constipation    Fatigue 03/07/2015   Headache(784.0)    Hematuria 04/12/2013   Hypothyroidism    Postmenopause 03/07/2015   Primary localized osteoarthritis of left knee 08/26/2016   Sleep disturbance 03/07/2015   Snoring    Thyroid  disease    Trigeminal neuralgia pain    Social History   Socioeconomic History   Marital status: Divorced    Spouse name: Not on file   Number of children: 0   Years of education: College   Highest education level: Not on file  Occupational History   Not on file  Tobacco Use   Smoking status: Never   Smokeless tobacco: Never  Vaping Use   Vaping status: Never Used  Substance and Sexual Activity   Alcohol use: Not Currently    Comment: 1-2 times a week   Drug use: No   Sexual activity: Yes    Birth control/protection: Surgical    Comment: supracervical hyst  Other Topics Concern   Not on file  Social History Narrative   Drinks about 5- 5 Hour [employer] a week    Social Drivers of Corporate investment banker Strain: Not on file  Food Insecurity: Not on file  Transportation Needs: Not on file  Physical Activity: Not on file  Stress: Not on file  Social Connections: Unknown (12/29/2021)   [facility]   Social Network  Social Network: Not on file  Intimate Partner Violence: Unknown (11/19/2021)   Received from [facility]   HITS    Physically Hurt: Not on file    Insult or Talk Down To: Not on file    Threaten Physical Harm: Not on file    Scream or Curse: Not on file   Family History  Problem Relation Age of Onset   Stroke Mother    Asthma Mother    Hypertension Mother    Heart disease Father    Hypertension Father    Cancer Sister    Thyroid  disease Sister    Breast cancer Sister 18       Twin Sister   Colon cancer Maternal Uncle    Breast cancer Cousin    Colon cancer Cousin 77   Past Surgical History:  Procedure  Laterality Date   ABDOMINAL HYSTERECTOMY     BREAST CYST ASPIRATION  2015   CARPAL TUNNEL RELEASE Right 07/2016   COLONOSCOPY  09/24/2011   DOQ:dzddpoz polyps in the ascending colon (tubular adenoma)/diverticula in the sigmoid colon/internal hemorrhoids   COLONOSCOPY WITH PROPOFOL  Left 01/18/2022   Procedure: COLONOSCOPY WITH PROPOFOL ;  Surgeon: Cindie Carlin POUR, DO;  [facility];  Service: Endoscopy;  Laterality: Left;  8:45am--ASA 2   cosmetic eye surgery     fibroids removed from uterus     HYSTEROSCOPY WITH D & C  12/03/2011   Procedure: DILATATION AND CURETTAGE /HYSTEROSCOPY;  Surgeon: Norleen LULLA Server, MD;  Location[facility];  Service: Gynecology;  Laterality: N/A;   KNEE ARTHROSCOPY W/ DEBRIDEMENT     LAPAROSCOPIC SALPINGO OOPHERECTOMY  07/11/2012   Procedure: LAPAROSCOPIC SALPINGO OOPHORECTOMY;  Surgeon: Norleen LULLA Server, MD;  Location[facility];  Service: Gynecology;  Laterality: Bilateral;   LAPAROSCOPIC SUPRACERVICAL HYSTERECTOMY  07/11/2012   Procedure: LAPAROSCOPIC SUPRACERVICAL HYSTERECTOMY;  Surgeon: Norleen LULLA Server, MD;  Location[facility];  Service: Gynecology;  Laterality: N/A;   LASIK     NASAL SEPTUM SURGERY     POLYPECTOMY  12/03/2011   Procedure: POLYPECTOMY;  Surgeon: Norleen LULLA Server, MD;  Location[facility];  Service: Gynecology;  Laterality: N/A;  resection of polyp   POLYPECTOMY  01/18/2022   Procedure: POLYPECTOMY INTESTINAL;  Surgeon: Cindie Carlin POUR, DO;  [facility];  Service: Endoscopy;;   TOTAL KNEE ARTHROPLASTY Left 09/06/2016   TOTAL KNEE ARTHROPLASTY Left 09/06/2016   Procedure: TOTAL KNEE ARTHROPLASTY;  Surgeon: Lamar Millman, MD;  Location: [facility];  Service: Orthopedics;  Laterality: Left;   TRIGGER FINGER RELEASE  12/2016   VEIN SURGERY     Varicose veins     Rolinda Rogue, MD 04/26/24 1459

## 2024-04-26 NOTE — ED Triage Notes (Addendum)
 Pt reports was outside and reports something black started flying around me and reports felt a pain in right upper eyelid approx 1 hour ago. Pt reports intermittent pain ever since. Pt noted to have cold compress to right eye. Pt reports vision out of right eye is blurry since bee sting.  Reports was wearing sunglasses at time of sting. Has had lasix, does not wear corrective lenses. Pt alert, oriented. Mild swelling to right eye during triage. Airway patent. NAD noted.

## 2024-08-27 ENCOUNTER — Ambulatory Visit (INDEPENDENT_AMBULATORY_CARE_PROVIDER_SITE_OTHER): Payer: Self-pay

## 2024-08-27 VITALS — BP 122/78 | HR 88 | Temp 97.9°F | Ht 66.0 in | Wt 134.4 lb

## 2024-08-27 DIAGNOSIS — B351 Tinea unguium: Secondary | ICD-10-CM

## 2024-08-27 DIAGNOSIS — Z1231 Encounter for screening mammogram for malignant neoplasm of breast: Secondary | ICD-10-CM | POA: Diagnosis not present

## 2024-08-27 DIAGNOSIS — G47419 Narcolepsy without cataplexy: Secondary | ICD-10-CM

## 2024-08-27 DIAGNOSIS — Z0000 Encounter for general adult medical examination without abnormal findings: Secondary | ICD-10-CM

## 2024-08-27 DIAGNOSIS — E039 Hypothyroidism, unspecified: Secondary | ICD-10-CM | POA: Diagnosis not present

## 2024-08-27 DIAGNOSIS — Z13220 Encounter for screening for lipoid disorders: Secondary | ICD-10-CM | POA: Diagnosis not present

## 2024-08-27 DIAGNOSIS — R5383 Other fatigue: Secondary | ICD-10-CM

## 2024-08-27 DIAGNOSIS — Z0001 Encounter for general adult medical examination with abnormal findings: Secondary | ICD-10-CM | POA: Diagnosis not present

## 2024-08-27 MED ORDER — AMPHETAMINE-DEXTROAMPHET ER 30 MG PO CP24: 30.0000 mg | ORAL_CAPSULE | Freq: Every day | ORAL | 0 refills | Status: AC

## 2024-08-27 MED ORDER — AMPHETAMINE-DEXTROAMPHET ER 30 MG PO CP24: 30.0000 mg | ORAL_CAPSULE | ORAL | 0 refills | Status: AC

## 2024-08-27 MED ORDER — FLUTICASONE PROPIONATE 50 MCG/ACT NA SUSP: 1.0000 | Freq: Every day | NASAL | 2 refills | Status: AC

## 2024-08-27 MED ORDER — LEVOTHYROXINE SODIUM 112 MCG PO TABS: 112.0000 ug | ORAL_TABLET | Freq: Every day | ORAL | 5 refills | Status: AC

## 2024-08-27 NOTE — Telephone Encounter (Signed)
 Pt is going to call to schedule mammogram tomorrow morning

## 2024-08-27 NOTE — Patient Instructions (Signed)
Modafinil

## 2024-08-27 NOTE — Progress Notes (Signed)
 "  New Patient Office Visit  Subjective    Patient ID: Yvette Joyce, female    DOB: 05-23-1961  Age: 64 y.o. MRN: [id number]  HPI Yvette Joyce presents to establish care and for annual physical.   The patient comes in today for a wellness visit.  A review of their health history was completed. A review of medications was also completed.  Any needed refills; Yes  Eating habits: Good, eats a good variety of fruits and vegetables.  Falls/  MVA accidents in past few months: No.   Regular exercise: Yes, plays pickle ball and walks dog daily for an hour.  Sleep: Good  Menstrual cycles/sexual history: Post-menopausal. Has had total hysterectomy including cervix. Does not get regular pap smears due to this.   Specialist pt sees on regular basis: None  Regular eye/dental exams: Yes  Preventative health issues were discussed.   Additional concerns: Patient would like a referral to podiatry.  Says that she has had both of her toenails removed due to both toenail fungus.  Toenails have grown back and they are still abnormal.   Has also been treated in the past for narcolepsy.  Takes Adderall for this at this time.  Has had sleep study and sleep latency test.  Reports that her sleep study results showed that she does not ever go into REM sleep and this causes her to have daytime fatigue.  Has tried multiple medications for this before Adderall.  Reports that she only takes the Adderall as needed and denies daily use of this.  Used to work third shift and says that this is the only medication that keeps her awake at times. Says that she will fall asleep while riding in a car or sitting down. Does drive, but says that she has never fallen asleep while driving.   Also endorses palpitations at times.  Says that it is related to her increased stress levels.  Says that she has had increased stress levels due to her boyfriend having medical issues.  Says that she does not need any help with  this.  Past Medical History:  Diagnosis Date   Adult attention deficit disorder    Arthritis    Complication of anesthesia    hard to wake up   Constipation    Fatigue 03/07/2015   Headache(784.0)    Hematuria 04/12/2013   Hypothyroidism    Postmenopause 03/07/2015   Primary localized osteoarthritis of left knee 08/26/2016   Sleep disturbance 03/07/2015   Snoring    Thyroid  disease    Trigeminal neuralgia pain     Past Surgical History:  Procedure Laterality Date   ABDOMINAL HYSTERECTOMY     BREAST CYST ASPIRATION  2015   CARPAL TUNNEL RELEASE Right 07/2016   COLONOSCOPY  09/24/2011   DOQ:dzddpoz polyps in the ascending colon (tubular adenoma)/diverticula in the sigmoid colon/internal hemorrhoids   COLONOSCOPY WITH PROPOFOL  Left 01/18/2022   Procedure: COLONOSCOPY WITH PROPOFOL ;  Surgeon: Cindie Carlin POUR, DO;  Location: [facility];  Service: Endoscopy;  Laterality: Left;  8:45am--ASA 2   cosmetic eye surgery     fibroids removed from uterus     HYSTEROSCOPY WITH D & C  12/03/2011   Procedure: DILATATION AND CURETTAGE /HYSTEROSCOPY;  Surgeon: Norleen LULLA Server, MD;  Location: [facility];  Service: Gynecology;  Laterality: N/A;   KNEE ARTHROSCOPY W/ DEBRIDEMENT     LAPAROSCOPIC SALPINGO OOPHERECTOMY  07/11/2012   Procedure: LAPAROSCOPIC SALPINGO OOPHORECTOMY;  Surgeon: Norleen LULLA Server, MD;  Location: [facility];  Service: Gynecology;  Laterality: Bilateral;   LAPAROSCOPIC SUPRACERVICAL HYSTERECTOMY  07/11/2012   Procedure: LAPAROSCOPIC SUPRACERVICAL HYSTERECTOMY;  Surgeon: Norleen LULLA Server, MD;  Location: [facility];  Service: Gynecology;  Laterality: N/A;   LASIK     NASAL SEPTUM SURGERY     POLYPECTOMY  12/03/2011   Procedure: POLYPECTOMY;  Surgeon: Norleen LULLA Server, MD;  Location: [facility];  Service: Gynecology;  Laterality: N/A;  resection of polyp   POLYPECTOMY  01/18/2022   Procedure: POLYPECTOMY INTESTINAL;  Surgeon: Cindie Carlin POUR, DO;  Location: [facility];  Service: Endoscopy;;    TOTAL KNEE ARTHROPLASTY Left 09/06/2016   TOTAL KNEE ARTHROPLASTY Left 09/06/2016   Procedure: TOTAL KNEE ARTHROPLASTY;  Surgeon: Lamar Millman, MD;  Location: [facility];  Service: Orthopedics;  Laterality: Left;   TRIGGER FINGER RELEASE  12/2016   VEIN SURGERY     Varicose veins    Family History  Problem Relation Age of Onset   Stroke Mother    Asthma Mother    Hypertension Mother    Heart disease Father    Hypertension Father    Cancer Sister    Thyroid  disease Sister    Breast cancer Sister 2       Twin Sister   Colon cancer Maternal Uncle    Breast cancer Cousin    Colon cancer Cousin 47    Social History   Socioeconomic History   Marital status: Divorced    Spouse name: Not on file   Number of children: 0   Years of education: College   Highest education level: Not on file  Occupational History   Not on file  Tobacco Use   Smoking status: Never   Smokeless tobacco: Never  Vaping Use   Vaping status: Never Used  Substance and Sexual Activity   Alcohol use: Not Currently    Comment: 1-2 times a week   Drug use: No   Sexual activity: Yes    Birth control/protection: Surgical    Comment: supracervical hyst  Other Topics Concern   Not on file  Social History Narrative   Drinks about 5- 5 Hour [employer] a week    Social Drivers of Health   Tobacco Use: Low Risk (04/26/2024)   Patient History    Smoking Tobacco Use: Never    Smokeless Tobacco Use: Never    Passive Exposure: Not on file  Financial Resource Strain: Not on file  Food Insecurity: Not on file  Transportation Needs: Not on file  Physical Activity: Not on file  Stress: Not on file  Social Connections: Unknown (12/29/2021)   Received from [facility]   Social Network    Social Network: Not on file  Intimate Partner Violence: Unknown (11/19/2021)   Received from [facility]   HITS    Physically Hurt: Not on file    Insult or Talk Down To: Not on file    Threaten Physical Harm: Not on file     Scream or Curse: Not on file  Depression (PHQ2-9): Low Risk (08/27/2024)   Depression (PHQ2-9)    PHQ-2 Score: 0  Alcohol Screen: Not on file  Housing: Not on file  Utilities: Not on file  Health Literacy: Not on file    Review of Systems  Constitutional:  Negative for fatigue and fever.  HENT:  Negative for sore throat.   Eyes:  Negative for visual disturbance.  Respiratory:  Negative for cough, shortness of breath and wheezing.   Cardiovascular:  Positive for palpitations. Negative for chest pain.  Gastrointestinal:  Negative for constipation, diarrhea, nausea and vomiting.  Genitourinary:  Negative for difficulty urinating, pelvic pain, vaginal bleeding, vaginal discharge and vaginal pain.  Neurological:  Negative for headaches.  Psychiatric/Behavioral:  Negative for behavioral problems, decreased concentration and sleep disturbance. The patient is not nervous/anxious.     Objective    BP 122/78   Pulse 88   Temp 97.9 F (36.6 C)   Ht 5' 6 (1.676 m)   Wt 134 lb 6.4 oz (61 kg)   LMP 11/25/2011   SpO2 98%   BMI 21.69 kg/m   Physical Exam Vitals and nursing note reviewed.  Constitutional:      General: She is not in acute distress.    Appearance: Normal appearance. She is not ill-appearing.  Neck:     Thyroid : No thyroid  mass, thyromegaly or thyroid  tenderness.  Cardiovascular:     Rate and Rhythm: Normal rate and regular rhythm.     Heart sounds: Normal heart sounds, S1 normal and S2 normal. No murmur heard. Pulmonary:     Effort: Pulmonary effort is normal. No respiratory distress.     Breath sounds: Normal breath sounds. No wheezing.  Abdominal:     General: There is no distension.     Palpations: Abdomen is soft.     Tenderness: There is no abdominal tenderness.     Hernia: No hernia is present.  Musculoskeletal:     Right lower leg: No edema.     Left lower leg: No edema.  Lymphadenopathy:     Cervical: No cervical adenopathy.  Skin:    General: Skin  is warm and dry.     Comments: Bilateral great toenails are thickened, discolored, with horizontal striations.  Neurological:     Mental Status: She is alert. Mental status is at baseline.  Psychiatric:        Mood and Affect: Mood normal.        Behavior: Behavior normal.        Thought Content: Thought content normal.        Judgment: Judgment normal.       08/27/2024    8:37 AM 05/27/2017   12:09 PM  Depression screen PHQ 2/9  Decreased Interest 0 3  Down, Depressed, Hopeless 0 1  PHQ - 2 Score 0 4  Altered sleeping 0 3  Tired, decreased energy 0 1  Change in appetite 0 3  Feeling bad or failure about yourself  0 1  Trouble concentrating 0 3  Moving slowly or fidgety/restless 0 0  Suicidal thoughts 0 0  PHQ-9 Score 0 15   Difficult doing work/chores Not difficult at all Very difficult     Data saved with a previous flowsheet row definition       08/27/2024    8:37 AM  GAD 7 : Generalized Anxiety Score  Nervous, Anxious, on Edge 1  Control/stop worrying 1  Worry too much - different things 0  Trouble relaxing 0  Restless 0  Easily annoyed or irritable 0  Afraid - awful might happen 0  Total GAD 7 Score 2    The 10-year ASCVD risk score (Arnett DK, et al., 2019) is: 3.9%   Values used to calculate the score:     Age: 42 years     Clinically relevant sex: Female     Is Non-Hispanic African American: No     Diabetic: No     Tobacco smoker: No  Systolic Blood Pressure: 122 mmHg     Is BP treated: No     HDL Cholesterol: 61 mg/dL     Total Cholesterol: 203 mg/dL   Assessment & Plan:  1. Well woman exam without gynecological exam (Primary) Adult wellness-complete.wellness physical was conducted today. Importance of diet and exercise were discussed in detail.  Importance of stress reduction and healthy living were discussed.  In addition to this a discussion regarding safety was also covered.  We also reviewed over immunizations and gave recommendations  regarding current immunization needed for age.   In addition to this additional areas were also touched on including: Preventative health exams needed: None.  Patient was advised yearly wellness exam  - CMP14+EGFR  2. Primary narcolepsy without cataplexy -Reviewed risk of Adderall use in patients over 65, including cardiovascular side effects, even when used for narcolepsy.  Patient reports infrequent use of Adderall.  Agreed to continue filling Adderall at this time with close monitoring, with plan for eventual neurology referral if symptoms worsen to evaluate safer long-term treatment options.  Patient verbalized understanding. Plan to follow-up with her at 6 months for this.  - amphetamine -dextroamphetamine (ADDERALL XR) 30 MG 24 hr capsule; Take 1 capsule (30 mg total) by mouth daily. Can fill 08/31/2024.  Dispense: 30 capsule; Refill: 0 - amphetamine -dextroamphetamine (ADDERALL XR) 30 MG 24 hr capsule; Take 1 capsule (30 mg total) by mouth daily.  Dispense: 30 capsule; Refill: 0 - amphetamine -dextroamphetamine (ADDERALL XR) 30 MG 24 hr capsule; Take 1 capsule (30 mg total) by mouth every morning.  Dispense: 30 capsule; Refill: 0  3. Hypothyroidism, unspecified type -Advised patient to continue taking Synthroid  at current dose.  Will repeat labs to ensure that thyroid  function levels are being treated effectively at the current dose.  - TSH + free T4  4. Toenail fungus -Recommended patient to follow-up with podiatry for issues with toenails.  - Ambulatory referral to Podiatry  5. Fatigue, unspecified type - CBC with Differential  6. Screening for lipid disorders - Lipid panel  7. Encounter for screening mammogram for malignant neoplasm of breast - MM 3D SCREENING MAMMOGRAM BILATERAL BREAST   Return in about 6 months (around 02/24/2025) for follow-up.   Damien KATHEE Pringle, FNP  "

## 2024-08-28 ENCOUNTER — Ambulatory Visit: Payer: Self-pay

## 2024-08-28 LAB — CMP14+EGFR
ALT: 16 IU/L (ref 0–32)
AST: 22 IU/L (ref 0–40)
Albumin: 4.6 g/dL (ref 3.9–4.9)
Alkaline Phosphatase: 148 IU/L — ABNORMAL HIGH (ref 49–135)
BUN/Creatinine Ratio: 10 — ABNORMAL LOW (ref 12–28)
BUN: 9 mg/dL (ref 8–27)
Bilirubin Total: 0.8 mg/dL (ref 0.0–1.2)
CO2: 23 mmol/L (ref 20–29)
Calcium: 10 mg/dL (ref 8.7–10.3)
Chloride: 103 mmol/L (ref 96–106)
Creatinine, Ser: 0.86 mg/dL (ref 0.57–1.00)
Globulin, Total: 2.3 g/dL (ref 1.5–4.5)
Glucose: 100 mg/dL — ABNORMAL HIGH (ref 70–99)
Potassium: 4.8 mmol/L (ref 3.5–5.2)
Sodium: 144 mmol/L (ref 134–144)
Total Protein: 6.9 g/dL (ref 6.0–8.5)
eGFR: 76 mL/min/1.73

## 2024-08-28 LAB — LIPID PANEL
Chol/HDL Ratio: 3.4 ratio (ref 0.0–4.4)
Cholesterol, Total: 239 mg/dL — ABNORMAL HIGH (ref 100–199)
HDL: 70 mg/dL
LDL Chol Calc (NIH): 154 mg/dL — ABNORMAL HIGH (ref 0–99)
Triglycerides: 87 mg/dL (ref 0–149)
VLDL Cholesterol Cal: 15 mg/dL (ref 5–40)

## 2024-08-28 LAB — CBC WITH DIFFERENTIAL/PLATELET
Basophils Absolute: 0.1 x10E3/uL (ref 0.0–0.2)
Basos: 1 %
EOS (ABSOLUTE): 0.1 x10E3/uL (ref 0.0–0.4)
Eos: 2 %
Hematocrit: 49.6 % — ABNORMAL HIGH (ref 34.0–46.6)
Hemoglobin: 15 g/dL (ref 11.1–15.9)
Immature Grans (Abs): 0 x10E3/uL (ref 0.0–0.1)
Immature Granulocytes: 0 %
Lymphocytes Absolute: 1.3 x10E3/uL (ref 0.7–3.1)
Lymphs: 24 %
MCH: 29.3 pg (ref 26.6–33.0)
MCHC: 30.2 g/dL — ABNORMAL LOW (ref 31.5–35.7)
MCV: 97 fL (ref 79–97)
Monocytes Absolute: 0.3 x10E3/uL (ref 0.1–0.9)
Monocytes: 6 %
Neutrophils Absolute: 3.7 x10E3/uL (ref 1.4–7.0)
Neutrophils: 67 %
Platelets: 217 x10E3/uL (ref 150–450)
RBC: 5.12 x10E6/uL (ref 3.77–5.28)
RDW: 12.3 % (ref 11.7–15.4)
WBC: 5.5 x10E3/uL (ref 3.4–10.8)

## 2024-08-28 LAB — TSH+FREE T4
Free T4: 1.13 ng/dL (ref 0.82–1.77)
TSH: 5.01 u[IU]/mL — ABNORMAL HIGH (ref 0.450–4.500)

## 2024-08-28 NOTE — Progress Notes (Signed)
 Please call the patient talked her about her labs.  Your bad cholesterol was slightly increased. This does not mean that you need medication right now, but it's a good opportunity to make changes that can protect your heart and blood vessels over time. Try to limit foods high in saturated fat which include: fried foods, fast food, fatty meats, and full-fat dairy products. Aim for at least 150 minutes of moderate exercise per week, such as brisk walking. We will recheck cholesterol levels at your 6 month follow-up. Your thyroid  tests show that your TSH is mildly elevated, while your T4 level is normal.  I would also like to monitor this over time.  Some of your labs in the the CBC were off.  This could be due to dehydration due to being fasting, and I would like to repeat this as well.  Fasting glucose was mildly elevated.  Alkaline phosphatase was also mildly elevated but not significant.  Let me know if she has any questions about this.  Thanks, Damien

## 2024-08-28 NOTE — Progress Notes (Signed)
 Called pt and told her about labs - she understood and stated she will work on it

## 2024-09-03 ENCOUNTER — Ambulatory Visit (HOSPITAL_COMMUNITY): Admission: RE | Admit: 2024-09-03 | Discharge: 2024-09-03 | Disposition: A | Payer: Self-pay | Source: Ambulatory Visit

## 2024-09-03 ENCOUNTER — Encounter (HOSPITAL_COMMUNITY): Payer: Self-pay

## 2024-09-03 ENCOUNTER — Ambulatory Visit: Payer: Self-pay

## 2024-09-03 DIAGNOSIS — Z1231 Encounter for screening mammogram for malignant neoplasm of breast: Secondary | ICD-10-CM | POA: Diagnosis present

## 2024-09-07 ENCOUNTER — Encounter: Payer: Self-pay | Admitting: Podiatry

## 2024-09-07 ENCOUNTER — Ambulatory Visit: Admitting: Podiatry

## 2024-09-07 DIAGNOSIS — L600 Ingrowing nail: Secondary | ICD-10-CM

## 2024-09-07 DIAGNOSIS — B351 Tinea unguium: Secondary | ICD-10-CM | POA: Diagnosis not present

## 2024-09-07 MED ORDER — TERBINAFINE HCL 250 MG PO TABS: 250.0000 mg | ORAL_TABLET | Freq: Every day | ORAL | 0 refills | Status: AC

## 2024-09-12 NOTE — Progress Notes (Signed)
 Subjective:   Patient ID: Yvette Joyce, female   DOB: 64 y.o.   MRN: [id number]   HPI Patient presents concerned about nail disease with thickness yellow discoloration and occasional discomfort.  Patient does not smoke likes to be active   Review of Systems  All other systems reviewed and are negative.       Objective:  Physical Exam Vitals and nursing note reviewed.  Constitutional:      Appearance: She is well-developed.  Pulmonary:     Effort: Pulmonary effort is normal.  Musculoskeletal:        General: Normal range of motion.  Skin:    General: Skin is warm.  Neurological:     Mental Status: She is alert.     Neurovascular found to be intact muscle strength adequate with discoloration around the hallux nails bilateral localized no redness erythema edema or drainage is noted.  Good digital perfusion well-oriented x 3     Assessment:  Damage to the hallux nails with possibility for fungal infiltration that is most likely secondary not primary     Plan:  H&P reviewed at great length.  She is quite adamant that she wants treatment for this and is willing to undergo risk of treatment and at this point due to this I have recommended oral Lamisil  and did write her a prescription for 1 Lamisil  a day for 90 days with education given concerning this.  She has had liver function studies done recently which were good and patient will be seen back to reevaluate this will take 6 to 9 months to hopefully create a difference

## 2025-02-25 ENCOUNTER — Ambulatory Visit
